# Patient Record
Sex: Male | Born: 1950 | Race: Black or African American | Hispanic: No | Smoking: Never smoker
Health system: Southern US, Community
[De-identification: ages and names within clinical notes are randomized; demographics above are authoritative.]

## PROBLEM LIST (undated history)

## (undated) DIAGNOSIS — E7800 Pure hypercholesterolemia, unspecified: Secondary | ICD-10-CM

## (undated) DIAGNOSIS — I2510 Atherosclerotic heart disease of native coronary artery without angina pectoris: Secondary | ICD-10-CM

## (undated) DIAGNOSIS — E119 Type 2 diabetes mellitus without complications: Secondary | ICD-10-CM

## (undated) DIAGNOSIS — N289 Disorder of kidney and ureter, unspecified: Secondary | ICD-10-CM

## (undated) DIAGNOSIS — I10 Essential (primary) hypertension: Secondary | ICD-10-CM

## (undated) HISTORY — DX: Type 2 diabetes mellitus without complications: E11.9

## (undated) HISTORY — DX: Disorder of kidney and ureter, unspecified: N28.9

## (undated) HISTORY — DX: Pure hypercholesterolemia, unspecified: E78.00

## (undated) HISTORY — PX: HERNIA REPAIR: SHX51

## (undated) HISTORY — DX: Atherosclerotic heart disease of native coronary artery without angina pectoris: I25.10

---

## 2006-03-22 ENCOUNTER — Emergency Department (HOSPITAL_COMMUNITY): Admission: EM | Admit: 2006-03-22 | Discharge: 2006-03-22 | Payer: Self-pay | Admitting: Emergency Medicine

## 2008-12-26 HISTORY — PX: NM MYOVIEW LTD: HXRAD82

## 2009-01-02 ENCOUNTER — Emergency Department (HOSPITAL_COMMUNITY): Admission: EM | Admit: 2009-01-02 | Discharge: 2009-01-02 | Payer: Self-pay | Admitting: Family Medicine

## 2009-01-02 ENCOUNTER — Observation Stay (HOSPITAL_COMMUNITY): Admission: EM | Admit: 2009-01-02 | Discharge: 2009-01-03 | Payer: Self-pay | Admitting: Emergency Medicine

## 2009-01-03 ENCOUNTER — Encounter (INDEPENDENT_AMBULATORY_CARE_PROVIDER_SITE_OTHER): Payer: Self-pay | Admitting: Family Medicine

## 2009-01-05 ENCOUNTER — Ambulatory Visit: Payer: Self-pay | Admitting: Family Medicine

## 2009-01-05 DIAGNOSIS — R7309 Other abnormal glucose: Secondary | ICD-10-CM | POA: Insufficient documentation

## 2009-01-05 DIAGNOSIS — R351 Nocturia: Secondary | ICD-10-CM | POA: Insufficient documentation

## 2009-01-05 DIAGNOSIS — R079 Chest pain, unspecified: Secondary | ICD-10-CM | POA: Insufficient documentation

## 2009-01-05 DIAGNOSIS — I10 Essential (primary) hypertension: Secondary | ICD-10-CM | POA: Insufficient documentation

## 2009-01-05 LAB — CONVERTED CEMR LAB: PSA: 1.94 ng/mL (ref 0.10–4.00)

## 2009-01-16 ENCOUNTER — Encounter (INDEPENDENT_AMBULATORY_CARE_PROVIDER_SITE_OTHER): Payer: Self-pay | Admitting: Family Medicine

## 2009-01-23 ENCOUNTER — Ambulatory Visit: Payer: Self-pay | Admitting: Family Medicine

## 2009-01-23 DIAGNOSIS — I10 Essential (primary) hypertension: Secondary | ICD-10-CM | POA: Insufficient documentation

## 2009-01-25 HISTORY — PX: CARDIAC CATHETERIZATION: SHX172

## 2009-02-03 ENCOUNTER — Ambulatory Visit (HOSPITAL_COMMUNITY): Admission: RE | Admit: 2009-02-03 | Discharge: 2009-02-03 | Payer: Self-pay | Admitting: Cardiology

## 2009-04-04 ENCOUNTER — Ambulatory Visit: Payer: Self-pay | Admitting: Physician Assistant

## 2009-04-04 DIAGNOSIS — N400 Enlarged prostate without lower urinary tract symptoms: Secondary | ICD-10-CM | POA: Insufficient documentation

## 2009-04-04 DIAGNOSIS — S335XXA Sprain of ligaments of lumbar spine, initial encounter: Secondary | ICD-10-CM | POA: Insufficient documentation

## 2009-04-04 DIAGNOSIS — E785 Hyperlipidemia, unspecified: Secondary | ICD-10-CM

## 2009-04-04 DIAGNOSIS — E782 Mixed hyperlipidemia: Secondary | ICD-10-CM | POA: Insufficient documentation

## 2009-04-18 ENCOUNTER — Ambulatory Visit: Payer: Self-pay | Admitting: Physician Assistant

## 2009-07-03 LAB — CONVERTED CEMR LAB
ALT: 16 units/L (ref 0–53)
AST: 18 units/L (ref 0–37)
Albumin: 4.1 g/dL (ref 3.5–5.2)
Alkaline Phosphatase: 49 units/L (ref 39–117)
BUN: 15 mg/dL (ref 6–23)
CO2: 29 meq/L (ref 19–32)
Calcium: 9 mg/dL (ref 8.4–10.5)
Chloride: 100 meq/L (ref 96–112)
Creatinine, Ser: 1.07 mg/dL (ref 0.40–1.50)
Glucose, Bld: 83 mg/dL (ref 70–99)
Potassium: 4.5 meq/L (ref 3.5–5.3)
Sodium: 137 meq/L (ref 135–145)
Total Bilirubin: 0.5 mg/dL (ref 0.3–1.2)
Total Protein: 7 g/dL (ref 6.0–8.3)

## 2009-11-28 ENCOUNTER — Ambulatory Visit: Payer: Self-pay | Admitting: Family Medicine

## 2009-11-28 ENCOUNTER — Ambulatory Visit (HOSPITAL_COMMUNITY): Admission: RE | Admit: 2009-11-28 | Discharge: 2009-11-28 | Payer: Self-pay | Admitting: Family Medicine

## 2009-11-28 DIAGNOSIS — R0609 Other forms of dyspnea: Secondary | ICD-10-CM

## 2009-11-28 DIAGNOSIS — R0600 Dyspnea, unspecified: Secondary | ICD-10-CM | POA: Insufficient documentation

## 2009-11-28 LAB — CONVERTED CEMR LAB
ALT: 16 units/L (ref 0–53)
AST: 15 units/L (ref 0–37)
Albumin: 4.3 g/dL (ref 3.5–5.2)
Alkaline Phosphatase: 50 units/L (ref 39–117)
BUN: 12 mg/dL (ref 6–23)
CO2: 27 meq/L (ref 19–32)
Calcium: 9.7 mg/dL (ref 8.4–10.5)
Chloride: 101 meq/L (ref 96–112)
Cholesterol: 185 mg/dL (ref 0–200)
Creatinine, Ser: 1.19 mg/dL (ref 0.40–1.50)
Glucose, Bld: 104 mg/dL — ABNORMAL HIGH (ref 70–99)
H Pylori IgG: POSITIVE
HCT: 42.1 % (ref 39.0–52.0)
HDL: 67 mg/dL (ref >39)
Hemoglobin: 14.2 g/dL (ref 13.0–17.0)
LDL Cholesterol: 106 mg/dL — ABNORMAL HIGH (ref 0–99)
MCHC: 33.7 g/dL (ref 30.0–36.0)
MCV: 89.2 fL (ref 78.0–100.0)
PSA: 1.9 ng/mL (ref 0.10–4.00)
Platelets: 374 10*3/uL (ref 150–400)
Potassium: 4.4 meq/L (ref 3.5–5.3)
RBC: 4.72 M/uL (ref 4.22–5.81)
RDW: 14.2 % (ref 11.5–15.5)
Sodium: 136 meq/L (ref 135–145)
Total Bilirubin: 0.5 mg/dL (ref 0.3–1.2)
Total CHOL/HDL Ratio: 2.8
Total Protein: 7.3 g/dL (ref 6.0–8.3)
Triglycerides: 62 mg/dL (ref <150)
VLDL: 12 mg/dL (ref 0–40)
WBC: 4.8 10*3/uL (ref 4.0–10.5)

## 2009-11-29 ENCOUNTER — Encounter: Payer: Self-pay | Admitting: Family Medicine

## 2009-11-29 ENCOUNTER — Telehealth: Payer: Self-pay | Admitting: Family Medicine

## 2009-11-30 ENCOUNTER — Telehealth: Payer: Self-pay | Admitting: Family Medicine

## 2009-12-07 ENCOUNTER — Encounter: Payer: Self-pay | Admitting: Family Medicine

## 2009-12-12 ENCOUNTER — Telehealth: Payer: Self-pay | Admitting: Family Medicine

## 2010-03-27 NOTE — Progress Notes (Signed)
  Phone Note Outgoing Call   Call placed by: Paula Compton MD,  November 29, 2009 2:12 PM Call placed to: Patient Summary of Call: Called patient and relayed results of labs, CXR.  Of note, mildly elevated fasting glucose (106).  Will recheck BP and fasting glucose in the coming 2 months.  reminder letter sent to patient. Initial call taken by: Paula Compton MD,  November 29, 2009 2:13 PM

## 2010-03-27 NOTE — Progress Notes (Signed)
  Phone Note Call from Patient   Caller: Patient Call For: [phone] Summary of Call: Patient want to discuss the meds you prescribed when he came for his first visit.  Thought it was only going to be one.  There were four waiting and he didn't understand what they were for.    Called patient back to discuss meds.  Left voice mail.  I had refilled the meds he had been placed on at [facility].  Please make appt for followup, and we will discuss. Paula Compton MD  December 12, 2009 12:35 PM

## 2010-03-27 NOTE — Letter (Signed)
Summary: REQUESTING RECORDS FROM [facility]   REQUESTING RECORDS FROM DR.JORDAN   Imported By: Arta Bruce 03/09/2009 16:00:08  _____________________________________________________________________  External Attachment:    Type:   Image     Comment:   External Document

## 2010-03-27 NOTE — Assessment & Plan Note (Signed)
Summary: 2 WEEK FU/////KT   Vital Signs:  Patient profile:   60 year old male Weight:      173.8 pounds BMI:     24.33 Temp:     98.2 degrees F oral Pulse rate:   71 / minute Pulse rhythm:   regular Resp:     17 per minute BP sitting:   160 / 114  (left arm) Cuff size:   regular  Vitals Entered By: Geanie Cooley (January 23, 2009 2:36 PM) CC: pt states that he has a little tightness in his check every now and then, pt wants to know results from blood work from previous visit Is Patient Diabetic? No Pain Assessment Patient in pain? no       Does patient need assistance? Functional Status Self care Ambulation Normal   Primary Provider:  Carolyne Fiscal  CC:  pt states that he has a little tightness in his check every now and then and pt wants to know results from blood work from previous visit.  History of Present Illness: BP again elevated today. Pt states was elevated during recent hospitalization and mildly elevated at cardiologist's office. He states he has never taken any medication for this.   Chest pain is unchanged, stable and controlled with rare use of NTG as well as daily ASA. He is scheduled to see cardiology over next week to set up a time to get angio and stent.  A1c in hospital was 6.1  Prostate- pt states that he gets up to urinate 2x per night, does have some dribbling and low flow stream. denies hematuria, recent weight loss. Unchanged from previous visit. He wishes to do something about it. Is desiring medications.  Allergies (verified): No Known Drug Allergies  Past History:  Past Medical History: Hypertension BPH  Review of Systems General:  Denies chills, fever, malaise, and sweats. CV:  Complains of chest pain or discomfort; denies difficulty breathing at night, fainting, leg cramps with exertion, near fainting, palpitations, shortness of breath with exertion, swelling of feet, and swelling of hands. Resp:  Complains of chest discomfort; denies chest  pain with inspiration, cough, coughing up blood, and wheezing. GI:  Denies abdominal pain.  Physical Exam  General:  Well-developed,well-nourished,in no acute distress; alert,appropriate and cooperative throughout examination Head:  Normocephalic and atraumatic without obvious abnormalities. No apparent alopecia or balding. Eyes:  vision grossly intact.   Ears:  no external deformities.   Nose:  no external deformity.   Neck:  No deformities, masses, or tenderness noted. Lungs:  Normal respiratory effort, chest expands symmetrically. Lungs are clear to auscultation, no crackles or wheezes. Heart:  Normal rate and regular rhythm. S1 and S2 normal without gallop, murmur, click, rub or other extra sounds. Abdomen:  Bowel sounds positive,abdomen soft and non-tender without masses, organomegaly or hernias noted. Psych:  Cognition and judgment appear intact. Alert and cooperative with normal attention span and concentration. No apparent delusions, illusions, hallucinations   Impression & Recommendations:  Problem # 1:  HYPERTENSION NEC (ICD-997.91) Elevated during last two visits.  Given DASH diet information.  Begin HCTZ today. Will see back in near future for BMP and re-eval BP. May require further BP lowering therapy.  Problem # 2:  OTHER ABNORMAL GLUCOSE (ICD-790.29) 12/2008 A1C 6.1 Impaired Glucose. DASH diet given.  Problem # 3:  NOCTURIA (ZOX-096.04) Assessment: Unchanged PSA 1.4. Rx for terazosin given to begin after he has stent placed.  Problem # 4:  CHEST PAIN (ICD-786.50) Assessment: Unchanged Failed stress test.  Seeing Cardiology for stent in near future. Has NTG and ASA.  Problem # 5:  PREVENTIVE HEALTH CARE (ICD-V70.0) 12/2008: A1C 6.1 PSA 1.4 Flu vaccine 12/2008.  Complete Medication List: 1)  Aspirin 81 Mg Tbec (Aspirin) .[name].. 1 by mouth daily 2)  Nitrostat 0.3 Mg Subl (Nitroglycerin) .... Take as needed chest pain 3)  Terazosin Hcl 1 Mg Caps (Terazosin hcl)  .[name].. 1 by mouth qhs 4)  Hydrochlorothiazide 25 Mg Tabs (Hydrochlorothiazide) .[name].. 1 by mouth daily  Patient Instructions: 1)  Please schedule a follow-up appointment for one week after stent placement. 2)  DASH diet given. 3)  Take an Aspirin every day. 4)  Check your Blood Pressure regularly. If it is above:140/90 you should make an appointment. Prescriptions: HYDROCHLOROTHIAZIDE 25 MG TABS (HYDROCHLOROTHIAZIDE) 1 by mouth daily  #30 x 11   Entered and Authorized by:   Syliva Overman MD   Signed by:   Syliva Overman MD on 01/23/2009   Method used:   Print then Give to Patient   RxID:   [id number] TERAZOSIN HCL 1 MG CAPS (TERAZOSIN HCL) 1 by mouth qhs  #30 x 11   Entered and Authorized by:   Syliva Overman MD   Signed by:   Syliva Overman MD on 01/23/2009   Method used:   Print then Give to Patient   RxID:   [id number]    Vital Signs:  Patient profile:   60 year old male Weight:      173.8 pounds BMI:     24.33 Temp:     98.2 degrees F oral Pulse rate:   71 / minute Pulse rhythm:   regular Resp:     17 per minute BP sitting:   160 / 114  (left arm) Cuff size:   regular  Vitals Entered By: Geanie Cooley (January 23, 2009 2:36 PM)  Appended Document: 2 WEEK FU/////KT   Appended Document: level of service added    Clinical Lists Changes  Orders: Added new Service order of Est. Patient Level IV (29528) - Signed Added new Service order of Est. Patient age 105-64 [phone]) - Signed

## 2010-03-27 NOTE — Letter (Signed)
Summary: PT INFORMATION SHEET  PT INFORMATION SHEET   Imported By: Arta Bruce 02/20/2009 12:18:15  _____________________________________________________________________  External Attachment:    Type:   Image     Comment:   External Document

## 2010-03-27 NOTE — Assessment & Plan Note (Signed)
Summary: np,df  Declines flu today ............................................... [facility] November 28, 2009 9:28 AM  Vital Signs:  Patient profile:   60 year old male Height:      71 inches Weight:      182.38 pounds BMI:     25.53 BSA:     2.03 Temp:     99.0 degrees F Pulse rate:   66 / minute BP sitting:   148 / 90  Vitals Entered By: Adam Burton CMA (November 28, 2009 8:38 AM) CC: NP Is Patient Diabetic? No Pain Assessment Patient in pain? yes     Location: both sides Intensity: 5   Primary Care Yussuf Sawyers:  Adam Burton  CC:  NP.  History of Present Illness: Adam Burton is new to our office, formerly a patient of [facility].  HIstory of HTN, high cholesterol and CAD found on cardiac catheterization without PCI (Dr. Swaziland is his cardiologist), here to establish care and "get back on track for my health".  His only complaint at this time is some nagging upper abdominal discomfort which is worse after meals.  Not associated with chest pain, nausea/vomiting, changes in bowel habits.  Has had some dyspnea with walking while talkingon cell phone, not as much if walking without talking.  Again, no associated chest pain.   Reviewed meds and ROS, recorded in appropriate sections of the chart.   Current Medications (verified): 1)  Aspirin 81 Mg Tbec (Aspirin) .[name].. 1 By Mouth Daily 2)  Nitrostat 0.3 Mg Subl (Nitroglycerin) .... Take As Needed Chest Pain 3)  Terazosin Hcl 1 Mg Caps (Terazosin Hcl) .[name].. 1 By Mouth Qhs 4)  Hydrochlorothiazide 25 Mg Tabs (Hydrochlorothiazide) .[name].. 1 By Mouth Daily 5)  Pravachol 20 Mg Tabs (Pravastatin Sodium) .... Take 1 Tab By Mouth At Bedtime For Cholesterol 6)  Proscar 5 Mg Tabs (Finasteride) .... Take 1 Tablet By Mouth Once A Day  Allergies (verified): No Known Drug Allergies  Past History:  Past Surgical History: Release of erection  Hernia repair when 6-7 yrs of age  Nov 28, 2009: Reports appendectomy at age 50 yrs.  Family  History: mother with DM and HTN, heart disease.  No known colon or prostate cancer in family.   Social History: Occupation: working in store Alcohol use-no Drug use-yes, [facility] Regular exercise-no  Nov 28, 2009: Never cigarette smoker, does smoke one THC/day.  Reports 2 shots liquor a day. 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  Born [place], came to [place] as a boy and raised there.  Here in [place] for past 10 yrs.  Lives alone.   Review of Systems       Denies chest pain, blood in stool, changes in stool habits. Reports weak urinary stream when off prostate meds, otherwise normal stream with no nocturia noted.  Reports dyspnea with walking and talking on phone for past 6 months, without chest pain.  Reports abd pain in upper abd that is worse after meals, when he lays down.  Denies weight loss or gain recently.   Physical Exam  General:  well appearing, no apparent distress Eyes:  clear sclerae bilat.  Mouth:  moist mucus membranes Neck:  neck supple.  Lungs:  Normal respiratory effort, chest expands symmetrically. Lungs are clear to auscultation, no crackles or wheezes. Heart:  Normal rate and regular rhythm. S1 and S2 normal without gallop, murmur, click, rub or other extra sounds. Abdomen:  soft, with mild epigastric tenderness. No masses or megaly noted.  Audible bowel sounds, mild  distention.  Extremities:  palpable dp pulses bilat.  No visible foot lesions, sensation grossly intact bilat.  No edema noted in ankles/lower extremities Neurologic:  sensation intact to light touch and gait normal.     Impression & Recommendations:  Problem # 1:  ABDOMINAL PAIN, EPIGASTRIC (ICD-789.06) Has never had H pylori checked.  Consider PPI to see if this improves.  Smoking and EtOH will aggravate this problem.   Orders: H pylori-[facility] ([address])  Problem # 2:  SHORTNESS OF BREATH (ICD-786.05) CXR to evaluate for pulmonary etiology.  To review past cath records (in [place]) and consider possible cardiac etiology  to this Orders: CXR- 2view (CXR)  Problem # 3:  HYPERTENSION (ICD-401.9) To continue his HCTZ; refills issued.  Consider for ACEI to improve his BP control.  Has not taken meds today His updated medication list for this problem includes:    Terazosin Hcl 1 Mg Caps (Terazosin hcl) .[name]... 1 by mouth qhs    Hydrochlorothiazide 25 Mg Tabs (Hydrochlorothiazide) .[name]... 1 by mouth daily  Orders: Comp Met-[facility] ([id number]) CBC-[facility] ([address]78)  Problem # 4:  DYSLIPIDEMIA (ICD-272.4) Recheck of lipid panel today. Continue Statin medication until results of lipid panel are back to determine if change in management needed His updated medication list for this problem includes:    Pravachol 20 Mg Tabs (Pravastatin sodium) .[name]... Take 1 tab by mouth at bedtime for cholesterol  Orders: Lipid-[facility] ([id number]) Comp Met-[facility] ([id number])  Problem # 5:  SCREENING, COLON CANCER (ICD-V76.51) Has never had colonoscopy for screening; discarded stool cards given to him in the past by [facility] providers.  He pledges to complete the stool card collection for colon cancer screening here today.  Will follow up on this at future visits.  Orders: Miscellaneous Lab Charge-[facility] [id number])  Complete Medication List: 1)  Aspirin 81 Mg Tbec (Aspirin) .[name].. 1 by mouth daily 2)  Nitrostat 0.3 Mg Subl (Nitroglycerin) .... Take as needed chest pain 3)  Terazosin Hcl 1 Mg Caps (Terazosin hcl) .[name].. 1 by mouth qhs 4)  Hydrochlorothiazide 25 Mg Tabs (Hydrochlorothiazide) .[name].. 1 by mouth daily 5)  Pravachol 20 Mg Tabs (Pravastatin sodium) .... Take 1 tab by mouth at bedtime for cholesterol 6)  Proscar 5 Mg Tabs (Finasteride) .... Take 1 tablet by mouth once a day  Other Orders: PSA-[facility] ([id number])  Patient Instructions: 1)  It was a pleasure to see you today.  2)  I will call you at [phone] with the results of todaY'S fasting bloodwork. 3)  I am sending you for a chest xray to look for causes of the shortness of breath.    4)  I would like to see you back in another 4 to 6 weeks, or sooner if needed.  Prescriptions: PROSCAR 5 MG TABS (FINASTERIDE) Take 1 tablet by mouth once a day  #90 x 3   Entered and Authorized by:   Adam Compton MD   Signed by:   Adam Compton MD on 11/28/2009   Method used:   Electronically to        [facility]       [address]       [place], [place]  [address]       Ph: [phone]       Fax: [phone]   RxID:   [id number] PRAVACHOL 20 MG TABS (PRAVASTATIN SODIUM) Take 1 tab by mouth at bedtime for cholesterol  #90 x 6  Entered and Authorized by:   Adam Compton MD   Signed by:   Adam Compton MD on 11/28/2009   Method used:   Electronically to        [facility]       [address]       [place], [place]  [address]       Ph: [phone]       Fax: [phone]   RxID:   [id number] HYDROCHLOROTHIAZIDE 25 MG TABS (HYDROCHLOROTHIAZIDE) 1 by mouth daily  #90 x 6   Entered and Authorized by:   Adam Compton MD   Signed by:   Adam Compton MD on 11/28/2009   Method used:   Electronically to        [facility] ([facility])       [address]       [place], [place]  [address]       Ph: [phone]       Fax: [phone]   RxID:   [id number] TERAZOSIN HCL 1 MG CAPS (TERAZOSIN HCL) 1 by mouth qhs  #90 x 6   Entered and Authorized by:   Adam Compton MD   Signed by:   Adam Compton MD on 11/28/2009   Method used:   Electronically to        [facility] (retail)       [address]       [place], [place]  [address]       Ph: [phone]       Fax: [phone]   RxID:   [id number]   Laboratory Results   Blood Tests   Date/Time Received: November 28, 2009 9:20 AM  Date/Time Reported: November 28, 2009 10:44 AM    H. pylori: positive Comments: ...............test performed by......Marland KitchenBonnie A.  Burton, MLS (ASCP)cm     In light of positive H pylori, will recommend treatment with amox 1g twice daily, clarithromycin 500mg  twice daily, and omeprazole 20mg  twice daily for 14 days.   Adam Compton MD  November 28, 2009 4:58 PM  Appended Document: Hemoccult card results  Laboratory Results  Date/Time Received: December 04, 2009   Date/Time Reported: December 06, 2009 1:44 PM  Comments: Stool samples were placed on wrong side of cards. Unable to test.  Pt.will need to be given new cards with complete instructions on collection. Adam Burton  December 06, 2009 1:44 PM    Will write letter to patient asking him to return for more cards. Adam Compton MD  December 07, 2009 2:04 PM

## 2010-03-27 NOTE — Letter (Signed)
Summary: Generic Letter  [facility]  [address]   [place], [place] [address]   Phone: [phone]  Fax: [phone]    11/29/2009  TURNER BAILLIE [address] [place], [place]  [address]  Dear Mr. TSOU,   This letter is a follow-up to our phone conversation from earlier today. Your fasting glucose (sugar) was very mildly above the normal level.  This does not qualify for the diagnosis of diabetes, but it is important to identify this if it persists.   Please make an appointment to have fasting labs drawn (nothing to eat or drink other than water for 8 hours) in the coming 2 months, and then to see me afterward.  I look forward to seeing you after your fasting labs.         Sincerely,   Paula Compton MD  Appended Document: Generic Letter mailed.

## 2010-03-27 NOTE — Letter (Signed)
Summary: DOCUMENTATION SHEET/DR.MICHAEL CONNOR  DOCUMENTATION SHEET/DR.MICHAEL CONNOR   Imported By: Arta Bruce 02/20/2009 12:14:19  _____________________________________________________________________  External Attachment:    Type:   Image     Comment:   External Document

## 2010-03-27 NOTE — Letter (Signed)
Summary: NUTRITIONIST SUMMARY  NUTRITIONIST SUMMARY   Imported By: Arta Bruce 06/20/2009 15:16:10  _____________________________________________________________________  External Attachment:    Type:   Image     Comment:   External Document

## 2010-03-27 NOTE — Progress Notes (Signed)
  Phone Note Call from Patient   Caller: Patient Call For: [phone] Summary of Call: Patient requesting a call back from physician. Initial call taken by: Abundio Miu,  November 30, 2009 9:07 AM    I called patient back; he has questions about how to return stool cards for fecal occult blood (colon cancer screening).  Clarified with him, as well as the plan for followup fasting labs and BP check before next physician appt.  Paula Compton MD  November 30, 2009 9:41 AM

## 2010-03-27 NOTE — Letter (Signed)
Summary: Discharge Summary  Discharge Summary   Imported By: Arta Bruce 01/11/2009 10:57:19  _____________________________________________________________________  External Attachment:    Type:   Image     Comment:   External Document

## 2010-03-27 NOTE — Progress Notes (Signed)
Summary: Office Visit/DEPRESSION SCREENING  Office Visit/DEPRESSION SCREENING   Imported By: Arta Bruce 02/15/2009 13:00:27  _____________________________________________________________________  External Attachment:    Type:   Image     Comment:   External Document

## 2010-03-27 NOTE — Letter (Signed)
Summary: REST OF DOCUMENTATIONSHEET FROM DR.CONNOR  REST OF DOCUMENTATIONSHEET FROM DR.CONNOR   Imported By: Arta Bruce 02/20/2009 12:15:54  _____________________________________________________________________  External Attachment:    Type:   Image     Comment:   External Document

## 2010-03-27 NOTE — Letter (Signed)
Summary: REFERRAL DIABETIC CLINIC //APPT DATE & TIME  REFERRAL DIABETIC CLINIC //APPT DATE & TIME   Imported By: Arta Bruce 05/26/2009 16:00:43  _____________________________________________________________________  External Attachment:    Type:   Image     Comment:   External Document

## 2010-03-27 NOTE — Assessment & Plan Note (Signed)
Summary: HEART ISSUES /PROSTATE//SS/   Vital Signs:  Patient profile:   59 year old male Height:      71 inches Weight:      173 pounds BMI:     24.22 Temp:     97.6 degrees F oral Pulse rate:   75 / minute Pulse rhythm:   regular Resp:     18 per minute BP sitting:   128 / 88  (left arm) Cuff size:   regular  Vitals Entered By: Armenia Shannon (April 04, 2009 3:33 PM) CC: f/u.. pt says he has a rigtht  back pain... pt thinks its his kidneys Is Patient Diabetic? No Pain Assessment Patient in pain? no       Does patient need assistance? Functional Status Self care Ambulation Normal   Primary Care Provider:  Carolyne Fiscal  CC:  f/u.. pt says he has a rigtht  back pain... pt thinks its his kidneys.  History of Present Illness: 60 year old male here for followup.  He is to see Dr. Carolyne Fiscal.  He was placed on HCTZ for his blood pressure.  He was also placed on Hytrin for his blood pressure and benign prostatic hypertrophy.  The patient has a lot of significant symptoms of nocturia.  The Hytrin has not really helped all that much.  He still gets up several times a night to urinate.  He has some dribbling sensation at night as well.  He denies dysuria.  He denies hematuria.  He denies any significant daytime symptoms.  His prostate was enlarged on exam initially saw Dr. Carolyne Fiscal.  His PSA was 1.4.  The patient underwent cardiac catheterization with Dr. Swaziland.  This demonstrated normal LV function and normal coronary anatomy.  He did have minor luminal irregularities.  In looking through his records his LDL was 106 when he was hospitalized in November.  He is still taking aspirin.  He complains of right-sided back pain.  This is been present for the last 2 days.  He denies any injury.  He denies any radiation down his leg.  He denies any radiation to his groin.  He tried anything for it.  He denies any dysuria or hematuria.  It is worse with certain types of movements.  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 Occupation: working in store Alcohol use-no Drug use-yes, THC Regular exercise-no  Physical Exam  General:  alert, well-developed, and well-nourished.   Head:  normocephalic and atraumatic.   Neck:  supple.   Lungs:  normal breath sounds, no crackles, and no wheezes.   Heart:  normal rate and regular rhythm.   Abdomen:  soft and non-tender.   Msk:  no CVA tenderness No spinal tenderness to palpation Negative straight leg raise bilaterally Paraspinal muscles on the right lower lumbar area somewhat tight Neurologic:  alert & oriented X3, cranial nerves II-XII intact, strength normal in all extremities, and DTRs symmetrical and normal.   Psych:  normally interactive.  Impression & Recommendations:  Problem # 1:  OTHER ABNORMAL GLUCOSE (ICD-790.29)  refer to Susie Piper  Orders: Diabetic Clinic Referral (Diabetic)  Problem # 2:  HYPERTENSION (ICD-401.9) check cmet better controlled   His updated medication list for this problem includes:    Terazosin Hcl 1 Mg Caps (Terazosin hcl) .[name]... 1 by mouth qhs    Hydrochlorothiazide 25 Mg Tabs (Hydrochlorothiazide) .[name]... 1 by mouth daily  Problem # 3:  DYSLIPIDEMIA (ICD-272.4)  with HTN, glucose intolerance and luminal irregs on cath, rec he take a statin continue asa   Orders: T-Comprehensive Metabolic Panel ([address]-98119)  His updated medication list for this  problem includes:    Pravachol 20 Mg Tabs (Pravastatin sodium) .[name]... Take 1 tab by mouth at bedtime for cholesterol  Problem # 4:  BEN LOC HYPERPLASIA PROS W/O UR OBST & OTH LUTS (ICD-600.20) with continued nocturia and enlarged prostate on exam previously and minimal to no response to hytrin, will add proscar  Problem # 5:  LUMBAR STRAIN (ICD-847.2) rest Heat  NSAID Followup p.r.n.  Problem # 6:  PREVENTIVE HEALTH CARE (ICD-V70.0) the patient still needs to complete stool cards Update tetanus today Will discuss referral to Dr. Corinda Gubler for screening colonoscopy at followup  Complete Medication List: 1)  Aspirin 81 Mg Tbec (Aspirin) .[name].. 1 by mouth daily 2)  Nitrostat 0.3 Mg Subl (Nitroglycerin) .... Take as needed chest pain 3)  Terazosin Hcl 1 Mg Caps (Terazosin hcl) .[name].. 1 by mouth qhs 4)  Hydrochlorothiazide 25 Mg Tabs (Hydrochlorothiazide) .[name].. 1 by mouth daily 5)  Pravachol 20 Mg Tabs (Pravastatin sodium) .... Take 1 tab by mouth at bedtime for cholesterol 6)  Proscar 5 Mg Tabs (Finasteride) .... Take 1 tablet by mouth once a day  Patient Instructions: 1)  Schedule fasting labs in 3 mos (Lipids and CMET) for Dx 272.4. 2)  Tetanus shot today. 3)  Please schedule a follow-up appointment in 3 months with Taleen Prosser for blood pressure and prostate.  4)  Take 400-600 mg of Ibuprofen (Advil, Motrin) with food every 4-6 hours as needed  for relief of pain or comfort of fever.  5)  Apply heat to your back three times a day for pain relief. 6)  Your back pain should improve in the next 1-2 weeks.  Return sooner if there is no improvement or it gets worse. Prescriptions: PROSCAR 5 MG TABS (FINASTERIDE) Take 1 tablet by mouth once a day  #30 x 3   Entered and Authorized by:   Tereso Newcomer PA-C   Signed by:   Tereso Newcomer PA-C on 04/04/2009   Method used:   Print then Give to Patient   RxID:   [id number] PRAVACHOL 20 MG TABS (PRAVASTATIN SODIUM) Take 1 tab by mouth at bedtime for  cholesterol  #30 x 5   Entered and Authorized by:   Tereso Newcomer PA-C   Signed by:   Tereso Newcomer PA-C on 04/04/2009   Method used:   Print then Give to Patient   RxID:   [id number]    Cardiac Cath  Procedure date:  02/03/2009  Findings:      HEMODYNAMIC DATA:  Aortic pressure is 120/72 with mean of 93 mmHg, left   ventricle pressure is 126 with EDP of 7 mmHg.      ANGIOGRAPHIC DATA:  The left coronary artery arises and distributes   normally.  The left main coronary artery is normal.      The left anterior descending artery has diffuse  minor irregularities in   the mid vessel less than 10%.      The left circumflex coronary artery is normal.      The right coronary artery is normal.      The left ventricular angiography was performed in RAO view.  It   demonstrates normal left ventricular size and contractility with normal   systolic function.  There is no mitral valve prolapse or regurgitation.      FINAL INTERPRETATION:   1. Normal coronary anatomy.   2. Normal left ventricular function.                  ______________________________   Peter M. Swaziland, M.D.

## 2010-03-27 NOTE — Letter (Signed)
Summary: Generic Letter  [facility]  [address]   [place], [place] [address]   Phone: [phone]  Fax: [phone]    12/07/2009  Adam Burton [address] [place], [place]  [address]  Dear Mr. [name],   I hope this letter finds you well.  I write to thank you for returning the stool cards to our office.   Unfortunately, the stool samples were placed on the wrong side of the cards, and the lab was unable to run the test. Please stop by the clinic to receive new cards, and to get instructions on how to collect the sample.           Sincerely,   Paula Compton MD  Appended Document: Generic Letter mailed.

## 2010-03-27 NOTE — Assessment & Plan Note (Signed)
Summary: XFU--CHEST PAIN//YC   Vital Signs:  Patient profile:   60 year old male Height:      71 inches Weight:      167 pounds BMI:     23.38 Temp:     97.4 degrees F oral Pulse rate:   66 / minute Pulse rhythm:   regular Resp:     18 per minute BP sitting:   141 / 84  (left arm) Cuff size:   regular  Vitals Entered By: Armenia Shannon (January 05, 2009 3:18 PM) CC: xf/u...... NP......[name] pt is concerned about the blockage that Dr. Swaziland let him know about today after taking the stress test.... pt has f/u with Dr. Swaziland Is Patient Diabetic? No Pain Assessment Patient in pain? no       Does patient need assistance? Functional Status Self care Ambulation Normal   Primary Provider:  Carolyne Fiscal  CC:  xf/u...... NP......[name] pt is concerned about the blockage that Dr. Swaziland let him know about today after taking the stress test.... pt has f/u with Dr. Swaziland.  History of Present Illness: Chest pain 3 days ago. Pt went to urgent care where he was given NTG and sent to ED. Pt was kept overnight with neg CE. EKG with nonspecific t wave abn. Pt discharged to have stress test next day. Pt states that cards called him this am and told him that he had some blockage and he has appt to see them again 01/27/2009. He has been without pain since incident. He has NTG, and will pick up ASA 81mg  to begin taking. His pain was assoc with rest, rad to jaw and L axilla, diaphoresis, difficulty breathing and resolution with NTG.  BP mildly elevated today. Pt states was elevated during hospitalization and mildly elevated at cardiologist's office. He states he has never taken any medication for this.   A1c in hospital was 6.1  Prostate- pt states that he gets up to urinate 2x per night, does have some dribbling and low flow stream. denies hematuria, recent weight loss.  Current Medications (verified): 1)  None  Allergies (verified): No Known Drug Allergies  Comments:  Nurse/Medical Assistant: pt does  have nitroglycerin med but he is not taking any med.......  Past History:  Past Surgical History: Release of erection  Hernia repair when 6-7 yrs of age  Social History: Occupation: working in store Alcohol use-no Drug use-yes, THC Regular exercise-no Occupation:  employed Drug Use:  yes Does Patient Exercise:  no  Review of Systems       The patient complains of chest pain and dyspnea on exertion.  The patient denies anorexia, fever, weight loss, weight gain, vision loss, decreased hearing, hoarseness, syncope, peripheral edema, prolonged cough, headaches, hemoptysis, abdominal pain, melena, hematochezia, severe indigestion/heartburn, hematuria, incontinence, genital sores, muscle weakness, suspicious skin lesions, transient blindness, difficulty walking, depression, unusual weight change, abnormal bleeding, enlarged lymph nodes, angioedema, breast masses, and testicular masses.    Physical Exam  General:  Well-developed,well-nourished,in no acute distress; alert,appropriate and cooperative throughout examination Head:  Normocephalic and atraumatic without obvious abnormalities. No apparent alopecia or balding. Eyes:  No corneal or conjunctival inflammation noted. EOMI. Perrla. Funduscopic exam benign, without hemorrhages, exudates or papilledema. Vision grossly normal. Ears:  External ear exam shows no significant lesions or deformities.  Otoscopic examination reveals clear canals, tympanic membranes are intact bilaterally without bulging, retraction, inflammation or discharge. Hearing is grossly normal bilaterally. Nose:  External nasal examination shows no deformity or inflammation. Nasal mucosa are  pink and moist without lesions or exudates. Mouth:  Oral mucosa and oropharynx without lesions or exudates.  Teeth in good repair. Neck:  No deformities, masses, or tenderness noted. Lungs:  Normal respiratory effort, chest expands symmetrically. Lungs are clear to auscultation, no  crackles or wheezes. Heart:  Normal rate and regular rhythm. S1 and S2 normal without gallop, murmur, click, rub or other extra sounds. Abdomen:  Bowel sounds positive,abdomen soft and non-tender without masses, organomegaly or hernias noted. Prostate:  >25gm enlarged and L asymmetrical enlargement.   Msk:  No deformity or scoliosis noted of thoracic or lumbar spine.   Neurologic:  No cranial nerve deficits noted. Station and gait are normal. Skin:  Intact without suspicious lesions or rashes Psych:  Cognition and judgment appear intact. Alert and cooperative with normal attention span and concentration. No apparent delusions, illusions, hallucinations   Impression & Recommendations:  Problem # 1:  CHEST PAIN (ICD-786.50) Not currently present, recent hospitalization. Pt seeing cards, per patient had abnormal stress test. Pt to f/u with cards. Will need medical risk reduction based on labs and BP checks. f/u here in 2 weeks and with cards on 01/27/2009.  Problem # 2:  ELEVATED BP READING WITHOUT DX HYPERTENSION (ICD-796.2) check outside and RTC in 2 weeks for BP check, consider starting meds.  Problem # 3:  NOCTURIA (ZOX-096.04) Prostate enlarged on exam.   Orders: T-PSA (54098-11914)  Problem # 4:  OTHER ABNORMAL GLUCOSE (ICD-790.29) A1C 6.1 in hospital.  Problem # 5:  Preventive Health Care (ICD-V70.0)  Orders: T-HIV Antibody  (Reflex) ([id number]) T-PSA ([id number]) Hemoccult Cards -3 specimans (take home) (95284) Influenza Vaccine MCR (13244) State-Tetanus Vaccine IM  (01027O)  Complete Medication List: 1)  Aspirin 81 Mg Tbec (Aspirin) .[name].. 1 by mouth daily 2)  Nitrostat 0.3 Mg Subl (Nitroglycerin) .... Take as needed chest pain  Other Orders: Flu Vaccine 75yrs + (53664) Admin 1st Vaccine (40347) Admin 1st Vaccine [facility]) [phone])  Patient Instructions: 1)  Please schedule a follow-up appointment in 2 weeks for BP check. 2)  Check your Blood Pressure regularly. 3)   NTG with chest pain.  4)  F/u with cardiology due to results of stress test.   Influenza Vaccine    Vaccine Type: Fluvax 3+    Site: right deltoid    Mfr: Sanofi Pasteur    Dose: 0.5 ml    Route: IM    Given by: Armenia Shannon    Exp. Date: 08/24/2009    Lot #: [id number]    VIS given: 09/18/06 version given January 05, 2009.  Flu Vaccine Consent Questions    Do you have a history of severe allergic reactions to this vaccine? no    Any prior history of allergic reactions to egg and/or gelatin? no    Do you have a sensitivity to the preservative Thimersol? no    Do you have a past history of Guillan-Barre Syndrome? no    Do you currently have an acute febrile illness? no    Have you ever had a severe reaction to latex? no    Vaccine information given and explained to patient? yes

## 2010-05-30 LAB — DIFFERENTIAL
Basophils Absolute: 0 10*3/uL (ref 0.0–0.1)
Basophils Relative: 1 % (ref 0–1)
Eosinophils Absolute: 0.1 10*3/uL (ref 0.0–0.7)
Eosinophils Relative: 2 % (ref 0–5)
Lymphocytes Relative: 46 % (ref 12–46)
Lymphs Abs: 2.5 10*3/uL (ref 0.7–4.0)
Monocytes Absolute: 0.7 10*3/uL (ref 0.1–1.0)
Monocytes Relative: 12 % (ref 3–12)
Neutro Abs: 2.2 10*3/uL (ref 1.7–7.7)
Neutrophils Relative %: 40 % — ABNORMAL LOW (ref 43–77)

## 2010-05-30 LAB — BASIC METABOLIC PANEL
BUN: 10 mg/dL (ref 6–23)
BUN: 12 mg/dL (ref 6–23)
CO2: 28 mEq/L (ref 19–32)
CO2: 29 mEq/L (ref 19–32)
Calcium: 8.5 mg/dL (ref 8.4–10.5)
Calcium: 8.8 mg/dL (ref 8.4–10.5)
Chloride: 102 mEq/L (ref 96–112)
Chloride: 105 mEq/L (ref 96–112)
Creatinine, Ser: 1.03 mg/dL (ref 0.4–1.5)
Creatinine, Ser: 1.06 mg/dL (ref 0.4–1.5)
GFR calc Af Amer: >60 mL/min (ref >60)
GFR calc Af Amer: >60 mL/min (ref >60)
GFR calc non Af Amer: >60 mL/min (ref >60)
GFR calc non Af Amer: >60 mL/min (ref >60)
Glucose, Bld: 82 mg/dL (ref 70–99)
Glucose, Bld: 94 mg/dL (ref 70–99)
Potassium: 3.6 mEq/L (ref 3.5–5.1)
Potassium: 3.8 mEq/L (ref 3.5–5.1)
Sodium: 136 mEq/L (ref 135–145)
Sodium: 140 mEq/L (ref 135–145)

## 2010-05-30 LAB — CBC
HCT: 40.5 % (ref 39.0–52.0)
HCT: 42.9 % (ref 39.0–52.0)
Hemoglobin: 13.9 g/dL (ref 13.0–17.0)
Hemoglobin: 14.7 g/dL (ref 13.0–17.0)
MCHC: 34.2 g/dL (ref 30.0–36.0)
MCHC: 34.3 g/dL (ref 30.0–36.0)
MCV: 93.4 fL (ref 78.0–100.0)
MCV: 94 fL (ref 78.0–100.0)
Platelets: 285 10*3/uL (ref 150–400)
Platelets: 289 10*3/uL (ref 150–400)
RBC: 4.33 MIL/uL (ref 4.22–5.81)
RBC: 4.57 MIL/uL (ref 4.22–5.81)
RDW: 13.8 % (ref 11.5–15.5)
RDW: 13.8 % (ref 11.5–15.5)
WBC: 4.9 10*3/uL (ref 4.0–10.5)
WBC: 5.5 10*3/uL (ref 4.0–10.5)

## 2010-05-30 LAB — LIPID PANEL
Cholesterol: 170 mg/dL (ref 0–200)
HDL: 50 mg/dL (ref >39)
LDL Cholesterol: 106 mg/dL — ABNORMAL HIGH (ref 0–99)
Total CHOL/HDL Ratio: 3.4 RATIO
Triglycerides: 72 mg/dL (ref <150)
VLDL: 14 mg/dL (ref 0–40)

## 2010-05-30 LAB — CARDIAC PANEL(CRET KIN+CKTOT+MB+TROPI)
CK, MB: 2.1 ng/mL (ref 0.3–4.0)
CK, MB: 2.2 ng/mL (ref 0.3–4.0)
Relative Index: 1.5 (ref 0.0–2.5)
Relative Index: 1.7 (ref 0.0–2.5)
Total CK: 127 U/L (ref 7–232)
Total CK: 136 U/L (ref 7–232)
Troponin I: 0.01 ng/mL (ref 0.00–0.06)
Troponin I: 0.01 ng/mL (ref 0.00–0.06)

## 2010-05-30 LAB — RAPID URINE DRUG SCREEN, HOSP PERFORMED
Amphetamines: NOT DETECTED
Barbiturates: NOT DETECTED
Benzodiazepines: NOT DETECTED
Cocaine: NOT DETECTED
Opiates: NOT DETECTED
Tetrahydrocannabinol: POSITIVE — AB

## 2010-05-30 LAB — CK TOTAL AND CKMB (NOT AT ARMC)
CK, MB: 3 ng/mL (ref 0.3–4.0)
Relative Index: 2.1 (ref 0.0–2.5)
Total CK: 144 U/L (ref 7–232)

## 2010-05-30 LAB — HEMOGLOBIN A1C
Hgb A1c MFr Bld: 6.1 % (ref 4.6–6.1)
Mean Plasma Glucose: 128 mg/dL

## 2010-05-30 LAB — TROPONIN I: Troponin I: 0.02 ng/mL (ref 0.00–0.06)

## 2010-05-30 LAB — TSH: TSH: 1.37 u[IU]/mL (ref 0.350–4.500)

## 2011-02-02 ENCOUNTER — Other Ambulatory Visit: Payer: Self-pay | Admitting: Family Medicine

## 2011-02-04 NOTE — Telephone Encounter (Signed)
Refill request

## 2011-03-29 ENCOUNTER — Ambulatory Visit (INDEPENDENT_AMBULATORY_CARE_PROVIDER_SITE_OTHER): Payer: Self-pay | Admitting: Family Medicine

## 2011-03-29 ENCOUNTER — Encounter: Payer: Self-pay | Admitting: Family Medicine

## 2011-03-29 DIAGNOSIS — Z1211 Encounter for screening for malignant neoplasm of colon: Secondary | ICD-10-CM

## 2011-03-29 DIAGNOSIS — R042 Hemoptysis: Secondary | ICD-10-CM | POA: Insufficient documentation

## 2011-03-29 DIAGNOSIS — R351 Nocturia: Secondary | ICD-10-CM

## 2011-03-29 DIAGNOSIS — E785 Hyperlipidemia, unspecified: Secondary | ICD-10-CM

## 2011-03-29 DIAGNOSIS — I10 Essential (primary) hypertension: Secondary | ICD-10-CM

## 2011-03-29 DIAGNOSIS — Z125 Encounter for screening for malignant neoplasm of prostate: Secondary | ICD-10-CM

## 2011-03-29 LAB — CBC WITH DIFFERENTIAL/PLATELET
Basophils Absolute: 0 10*3/uL (ref 0.0–0.1)
Basophils Relative: 1 % (ref 0–1)
Eosinophils Absolute: 0.1 10*3/uL (ref 0.0–0.7)
Eosinophils Relative: 2 % (ref 0–5)
HCT: 45 % (ref 39.0–52.0)
Hemoglobin: 15.1 g/dL (ref 13.0–17.0)
Lymphocytes Relative: 38 % (ref 12–46)
Lymphs Abs: 1.6 10*3/uL (ref 0.7–4.0)
MCH: 31.1 pg (ref 26.0–34.0)
MCHC: 33.6 g/dL (ref 30.0–36.0)
MCV: 92.6 fL (ref 78.0–100.0)
Monocytes Absolute: 0.5 10*3/uL (ref 0.1–1.0)
Monocytes Relative: 12 % (ref 3–12)
Neutro Abs: 2 10*3/uL (ref 1.7–7.7)
Neutrophils Relative %: 47 % (ref 43–77)
Platelets: 315 10*3/uL (ref 150–400)
RBC: 4.86 MIL/uL (ref 4.22–5.81)
RDW: 14.6 % (ref 11.5–15.5)
WBC: 4.2 10*3/uL (ref 4.0–10.5)

## 2011-03-29 LAB — COMPREHENSIVE METABOLIC PANEL
ALT: 18 U/L (ref 0–53)
AST: 21 U/L (ref 0–37)
Albumin: 4.5 g/dL (ref 3.5–5.2)
Alkaline Phosphatase: 53 U/L (ref 39–117)
BUN: 16 mg/dL (ref 6–23)
CO2: 27 mEq/L (ref 19–32)
Calcium: 9.7 mg/dL (ref 8.4–10.5)
Chloride: 101 mEq/L (ref 96–112)
Creat: 1.23 mg/dL (ref 0.50–1.35)
Glucose, Bld: 135 mg/dL — ABNORMAL HIGH (ref 70–99)
Potassium: 4 mEq/L (ref 3.5–5.3)
Sodium: 138 mEq/L (ref 135–145)
Total Bilirubin: 0.4 mg/dL (ref 0.3–1.2)
Total Protein: 7.2 g/dL (ref 6.0–8.3)

## 2011-03-29 LAB — POCT UA - MICROSCOPIC ONLY

## 2011-03-29 LAB — POCT URINALYSIS DIPSTICK
Bilirubin, UA: NEGATIVE
Glucose, UA: NEGATIVE
Ketones, UA: NEGATIVE
Leukocytes, UA: NEGATIVE
Nitrite, UA: NEGATIVE
Protein, UA: 30
Spec Grav, UA: 1.025
Urobilinogen, UA: 0.2
pH, UA: 7

## 2011-03-29 LAB — LDL CHOLESTEROL, DIRECT: Direct LDL: 112 mg/dL — ABNORMAL HIGH

## 2011-03-29 MED ORDER — LISINOPRIL-HYDROCHLOROTHIAZIDE 20-25 MG PO TABS: 1.0000 | ORAL_TABLET | Freq: Every day | ORAL | Status: DC

## 2011-03-29 MED ORDER — TERAZOSIN HCL 1 MG PO CAPS: 1.0000 mg | ORAL_CAPSULE | Freq: Every day | ORAL | Status: DC

## 2011-03-29 MED ORDER — PRAVASTATIN SODIUM 20 MG PO TABS: 20.0000 mg | ORAL_TABLET | Freq: Every day | ORAL | Status: DC

## 2011-03-29 NOTE — Patient Instructions (Signed)
It was a pleasure to see you today,. We are doing several important things:  Labs today.   I am starting you on lisinopril/HCT 20/25, take 1 tablet daily for blood pressure.  Call if you feel dizzy, get worsening cough, or swelling in tongue or lips (stop the medicine and call me);  Stool cards for colon cancer screening.  Chest x-ray for the cough with specks of blood.  I will call your home number when I get the result; we will likely need to do a CT scan afterward to follow up.   [facility] Card, please talk with Rudell Cobb about getting this.   I would like to see you back in the next 2 to 3 weeks for follow up. Please make an appointment for this.

## 2011-03-29 NOTE — Assessment & Plan Note (Signed)
Patient with significant (THC) smoking history and now reporting cough with blood and an objective weight loss over several months.  Concern for pulmonary versus head/neck neoplasm.  To start with lab workup, CXR and proceed to high-res CT of chest. Also to consider ENT for laryngoscopic evaluation.  Patient is urged to contact Rudell Cobb to reinstate his [employer], which he states has lapsed.  For follow up in the coming month.

## 2011-03-29 NOTE — Progress Notes (Signed)
  Subjective:    Patient ID: Adam Burton, male    DOB: May 28, 1950, 61 y.o.   MRN: [id number]  HPI Patient comes in today with his wife.  Several concerns:  1. Nocturia is worse; gets up 3-4 times/noc.  Poor stream.  Had been doing better on Cardura 1mg  daily but ran out 1 month ago. Finasteride 5mg  daily did not help, so he stopped taking it.   2. High blood pressure treated with HCTZ 25mg  daily.  Still taking.  Denies chest pain or pressure, no palpitations.   3. Peristent cough for prolonged period of time (not specific).  Cough is associated with seeing "flecks of blood" in sputum.  Never been a tobacco smoker, but does smoke THC one time daily.  Wife remarks that he is occasionally hoarse as well.   Family Hx: Mother, MGM and maternal aunt with DM; mother with HTN as well. No colorectal or prostate cancer in family.  No CVA history in family.   Review of Systems  Wife reports that patient has lost weight (patient denies).  Objective review of medical record shows a 13# loss since Oct 2011.  Denies sweats or chills, or fatigue. Denies changes in bowel habits or blood in stool.  Reports that he has done stool cards in the past, however I am unable to locate records of this in the EMR.     Objective:   Physical Exam Well appearing, no apparent distress.  HEENT Neck supple.  No cervical or supraclavicular adenopathy. No thyroid nodules or tenderness.  Moist mucus membranes. Voice not hoarse. TMs clear bilaterally.  COR S1S2, no extra sounds or murmurs appreciated PULM Clear bilaterally, no rales or wheezes.  ABD Soft, nontender, nondistended. No masses or organomegaly. Normal bowel sounds. GU: Prostate enlargement without nodularity.  LEs No ankle edema; palpable dp pulses bilaterally.        Assessment & Plan:

## 2011-03-29 NOTE — Assessment & Plan Note (Signed)
Stool cards today for colon cancer screening

## 2011-03-29 NOTE — Assessment & Plan Note (Signed)
Adding lisinopril to HCTZ today; checking renal function today, will follow up in 2 to 3 weeks

## 2011-03-29 NOTE — Assessment & Plan Note (Signed)
Check LDL-direct. nonfasting today.

## 2011-03-29 NOTE — Assessment & Plan Note (Signed)
Likely BPH in origin.  UA today, as well as pSA (prior value from 2011 noted).  Restart Cardura, hold finasteride for now.

## 2011-03-29 NOTE — Assessment & Plan Note (Signed)
To check PSA today

## 2011-03-30 LAB — PSA: PSA: 2.22 ng/mL (ref <=4.00)

## 2011-04-05 ENCOUNTER — Ambulatory Visit
Admission: RE | Admit: 2011-04-05 | Discharge: 2011-04-05 | Disposition: A | Discharge status: Home / Self Care | Payer: No Typology Code available for payment source | Source: Ambulatory Visit | Attending: Family Medicine | Admitting: Family Medicine

## 2011-04-05 LAB — HEMOCCULT GUIAC POC 1CARD (OFFICE)

## 2011-04-05 NOTE — Progress Notes (Signed)
Addended by: Jennette Bill on: 04/05/2011 04:26 PM   Modules accepted: Orders

## 2011-04-06 ENCOUNTER — Telehealth: Payer: Self-pay | Admitting: Family Medicine

## 2011-04-06 DIAGNOSIS — Z1211 Encounter for screening for malignant neoplasm of colon: Secondary | ICD-10-CM

## 2011-04-06 NOTE — Telephone Encounter (Signed)
Called patient at his home to report negative chest x-ray, also to tell him that his stool cards could not be read due to excessive stool on cards.  Left voice message for him to call back.   I had told him at visit that despite anticipated negative CXR results, I plan to order a CT chest to work up his complaint of BLOODY SPUTUM WITH COUGH.  I am entering this order today.   Will also want ENT referral for laryngoscopy to work up hemoptysis after CT chest is done.  Paula Compton, MD

## 2011-04-08 NOTE — Telephone Encounter (Signed)
Pt will come in Wed to get stool card and let nurse know when he can have CT done - also will bring insurance card

## 2011-04-08 NOTE — Telephone Encounter (Signed)
Left message for patient to return call. Need to know what day he is available for chest CT. Also he needs to come in to recollect stool cards, the ones he brought back were unable to be read due to being collected incorrectly, he will ne to be instructed on how to do these. Please also ask patient to bring in a copy of his insurance card as there is NOT one scanned into the chart, this information will be needed in order to do prior authorization.Busick, Rodena Medin

## 2011-04-09 NOTE — Telephone Encounter (Signed)
Pt is asking to speak with Dr Mauricio Po in regards to phn message on Saturday

## 2011-04-19 ENCOUNTER — Ambulatory Visit (HOSPITAL_COMMUNITY)
Admission: RE | Admit: 2011-04-19 | Discharge: 2011-04-19 | Disposition: A | Discharge status: Home / Self Care | Payer: Self-pay | Source: Ambulatory Visit | Attending: Family Medicine | Admitting: Family Medicine

## 2011-04-19 ENCOUNTER — Inpatient Hospital Stay (HOSPITAL_COMMUNITY): Payer: Self-pay

## 2011-04-19 ENCOUNTER — Ambulatory Visit (INDEPENDENT_AMBULATORY_CARE_PROVIDER_SITE_OTHER): Payer: No Typology Code available for payment source | Admitting: Family Medicine

## 2011-04-19 ENCOUNTER — Encounter (HOSPITAL_COMMUNITY): Payer: Self-pay | Admitting: Radiology

## 2011-04-19 ENCOUNTER — Encounter: Payer: Self-pay | Admitting: Family Medicine

## 2011-04-19 ENCOUNTER — Other Ambulatory Visit: Payer: Self-pay

## 2011-04-19 ENCOUNTER — Inpatient Hospital Stay (HOSPITAL_COMMUNITY)
Admission: AD | Admit: 2011-04-19 | Discharge: 2011-04-20 | DRG: 313 | Disposition: A | Discharge status: Home / Self Care | Payer: Self-pay | Source: Ambulatory Visit | Attending: Family Medicine | Admitting: Family Medicine

## 2011-04-19 DIAGNOSIS — I10 Essential (primary) hypertension: Secondary | ICD-10-CM

## 2011-04-19 DIAGNOSIS — R079 Chest pain, unspecified: Principal | ICD-10-CM | POA: Diagnosis present

## 2011-04-19 DIAGNOSIS — R0789 Other chest pain: Secondary | ICD-10-CM

## 2011-04-19 DIAGNOSIS — F1210 Cannabis abuse, uncomplicated: Secondary | ICD-10-CM | POA: Diagnosis present

## 2011-04-19 DIAGNOSIS — Z1211 Encounter for screening for malignant neoplasm of colon: Secondary | ICD-10-CM

## 2011-04-19 DIAGNOSIS — R94120 Abnormal auditory function study: Secondary | ICD-10-CM | POA: Insufficient documentation

## 2011-04-19 DIAGNOSIS — E785 Hyperlipidemia, unspecified: Secondary | ICD-10-CM | POA: Diagnosis present

## 2011-04-19 DIAGNOSIS — R042 Hemoptysis: Secondary | ICD-10-CM | POA: Diagnosis present

## 2011-04-19 DIAGNOSIS — F17200 Nicotine dependence, unspecified, uncomplicated: Secondary | ICD-10-CM | POA: Diagnosis present

## 2011-04-19 DIAGNOSIS — F1410 Cocaine abuse, uncomplicated: Secondary | ICD-10-CM | POA: Diagnosis present

## 2011-04-19 DIAGNOSIS — I209 Angina pectoris, unspecified: Secondary | ICD-10-CM | POA: Insufficient documentation

## 2011-04-19 DIAGNOSIS — F1910 Other psychoactive substance abuse, uncomplicated: Secondary | ICD-10-CM

## 2011-04-19 HISTORY — DX: Essential (primary) hypertension: I10

## 2011-04-19 LAB — COMPREHENSIVE METABOLIC PANEL
ALT: 32 U/L (ref 0–53)
AST: 32 U/L (ref 0–37)
Albumin: 3.8 g/dL (ref 3.5–5.2)
Alkaline Phosphatase: 69 U/L (ref 39–117)
BUN: 13 mg/dL (ref 6–23)
CO2: 27 mEq/L (ref 19–32)
Calcium: 9.5 mg/dL (ref 8.4–10.5)
Chloride: 102 mEq/L (ref 96–112)
Creatinine, Ser: 1.18 mg/dL (ref 0.50–1.35)
GFR calc Af Amer: 75 mL/min — ABNORMAL LOW (ref >90)
GFR calc non Af Amer: 64 mL/min — ABNORMAL LOW (ref >90)
Glucose, Bld: 67 mg/dL — ABNORMAL LOW (ref 70–99)
Potassium: 3.8 mEq/L (ref 3.5–5.1)
Sodium: 139 mEq/L (ref 135–145)
Total Bilirubin: 0.6 mg/dL (ref 0.3–1.2)
Total Protein: 7.6 g/dL (ref 6.0–8.3)

## 2011-04-19 LAB — HEMOCCULT GUIAC POC 1CARD (OFFICE)

## 2011-04-19 LAB — DIFFERENTIAL
Basophils Absolute: 0 10*3/uL (ref 0.0–0.1)
Basophils Relative: 0 % (ref 0–1)
Eosinophils Absolute: 0.1 10*3/uL (ref 0.0–0.7)
Eosinophils Relative: 1 % (ref 0–5)
Lymphocytes Relative: 16 % (ref 12–46)
Lymphs Abs: 2 10*3/uL (ref 0.7–4.0)
Monocytes Absolute: 1.8 10*3/uL — ABNORMAL HIGH (ref 0.1–1.0)
Monocytes Relative: 14 % — ABNORMAL HIGH (ref 3–12)
Neutro Abs: 8.7 10*3/uL — ABNORMAL HIGH (ref 1.7–7.7)
Neutrophils Relative %: 69 % (ref 43–77)

## 2011-04-19 LAB — RAPID URINE DRUG SCREEN, HOSP PERFORMED
Amphetamines: NOT DETECTED
Barbiturates: NOT DETECTED
Benzodiazepines: NOT DETECTED
Cocaine: POSITIVE — AB
Opiates: NOT DETECTED
Tetrahydrocannabinol: POSITIVE — AB

## 2011-04-19 LAB — CARDIAC PANEL(CRET KIN+CKTOT+MB+TROPI)
CK, MB: 2.1 ng/mL (ref 0.3–4.0)
CK, MB: 1.6 ng/mL (ref 0.3–4.0)
Relative Index: 1.3 (ref 0.0–2.5)
Relative Index: 1.2 (ref 0.0–2.5)
Total CK: 159 U/L (ref 7–232)
Total CK: 139 U/L (ref 7–232)
Troponin I: <0.3 ng/mL (ref <0.30)
Troponin I: <0.3 ng/mL (ref <0.30)

## 2011-04-19 LAB — TSH: TSH: 0.539 u[IU]/mL (ref 0.350–4.500)

## 2011-04-19 LAB — CBC
HCT: 40.5 % (ref 39.0–52.0)
Hemoglobin: 14.3 g/dL (ref 13.0–17.0)
MCH: 31.8 pg (ref 26.0–34.0)
MCHC: 35.3 g/dL (ref 30.0–36.0)
MCV: 90 fL (ref 78.0–100.0)
Platelets: 303 10*3/uL (ref 150–400)
RBC: 4.5 MIL/uL (ref 4.22–5.81)
RDW: 13.7 % (ref 11.5–15.5)
WBC: 12.6 10*3/uL — ABNORMAL HIGH (ref 4.0–10.5)

## 2011-04-19 LAB — HEMOGLOBIN A1C
Hgb A1c MFr Bld: 6.1 % — ABNORMAL HIGH (ref <5.7)
Mean Plasma Glucose: 128 mg/dL — ABNORMAL HIGH (ref <117)

## 2011-04-19 MED ORDER — NITROGLYCERIN 0.4 MG SL SUBL: 0.4000 mg | SUBLINGUAL_TABLET | SUBLINGUAL | Status: DC | PRN

## 2011-04-19 MED ORDER — NITROGLYCERIN 0.4 MG SL SUBL
0.4000 mg | SUBLINGUAL_TABLET | Freq: Once | SUBLINGUAL | Status: AC
  Administered 2011-04-19: 0.4 mg via SUBLINGUAL

## 2011-04-19 MED ORDER — ACETAMINOPHEN 325 MG PO TABS
650.0000 mg | ORAL_TABLET | Freq: Four times a day (QID) | ORAL | Status: DC | PRN
  Administered 2011-04-19: 650 mg via ORAL
  Filled 2011-04-19: qty 2

## 2011-04-19 MED ORDER — HYDROCHLOROTHIAZIDE 25 MG PO TABS
25.0000 mg | ORAL_TABLET | Freq: Every day | ORAL | Status: DC
  Administered 2011-04-19 – 2011-04-20 (×2): 25 mg via ORAL
  Filled 2011-04-19 (×2): qty 1

## 2011-04-19 MED ORDER — SODIUM CHLORIDE 0.9 % IV SOLN: 250.0000 mL | INTRAVENOUS | Status: DC | PRN

## 2011-04-19 MED ORDER — TERAZOSIN HCL 1 MG PO CAPS
1.0000 mg | ORAL_CAPSULE | Freq: Every day | ORAL | Status: DC
  Administered 2011-04-19: 1 mg via ORAL
  Filled 2011-04-19 (×2): qty 1

## 2011-04-19 MED ORDER — LISINOPRIL-HYDROCHLOROTHIAZIDE 20-25 MG PO TABS: 1.0000 | ORAL_TABLET | Freq: Every day | ORAL | Status: DC

## 2011-04-19 MED ORDER — SODIUM CHLORIDE 0.9 % IJ SOLN
3.0000 mL | Freq: Two times a day (BID) | INTRAMUSCULAR | Status: DC
  Administered 2011-04-19: 3 mL via INTRAVENOUS

## 2011-04-19 MED ORDER — ENOXAPARIN SODIUM 40 MG/0.4ML ~~LOC~~ SOLN
40.0000 mg | SUBCUTANEOUS | Status: DC
  Administered 2011-04-19: 40 mg via SUBCUTANEOUS
  Filled 2011-04-19 (×2): qty 0.4

## 2011-04-19 MED ORDER — ASPIRIN 325 MG PO TABS: 325.0000 mg | ORAL_TABLET | Freq: Every day | ORAL | Status: DC

## 2011-04-19 MED ORDER — ASPIRIN 325 MG PO TABS
325.0000 mg | ORAL_TABLET | Freq: Once | ORAL | Status: AC
  Administered 2011-04-19: 325 mg via ORAL

## 2011-04-19 MED ORDER — ASPIRIN EC 81 MG PO TBEC
81.0000 mg | DELAYED_RELEASE_TABLET | Freq: Every day | ORAL | Status: DC
  Administered 2011-04-20: 81 mg via ORAL
  Filled 2011-04-19: qty 1

## 2011-04-19 MED ORDER — HYDRALAZINE HCL 20 MG/ML IJ SOLN
10.0000 mg | INTRAMUSCULAR | Status: DC | PRN
  Filled 2011-04-19: qty 0.5

## 2011-04-19 MED ORDER — NITROGLYCERIN 0.3 MG SL SUBL: 0.4000 mg | SUBLINGUAL_TABLET | Freq: Once | SUBLINGUAL | Status: DC

## 2011-04-19 MED ORDER — IOHEXOL 300 MG/ML  SOLN
100.0000 mL | Freq: Once | INTRAMUSCULAR | Status: AC | PRN
  Administered 2011-04-19: 100 mL via INTRAVENOUS

## 2011-04-19 MED ORDER — SIMVASTATIN 10 MG PO TABS
10.0000 mg | ORAL_TABLET | Freq: Every day | ORAL | Status: DC
  Administered 2011-04-19: 10 mg via ORAL
  Filled 2011-04-19 (×2): qty 1

## 2011-04-19 MED ORDER — LISINOPRIL 20 MG PO TABS
20.0000 mg | ORAL_TABLET | Freq: Every day | ORAL | Status: DC
  Administered 2011-04-19 – 2011-04-20 (×2): 20 mg via ORAL
  Filled 2011-04-19 (×2): qty 1

## 2011-04-19 MED ORDER — SODIUM CHLORIDE 0.9 % IJ SOLN: 3.0000 mL | INTRAMUSCULAR | Status: DC | PRN

## 2011-04-19 NOTE — H&P (Signed)
Adam Burton is an 61 y.o. male.   Chief Complaint:  Chest pain HPI:  3 day history of substernal chest pain with radiation down his left arm. He describes the pain as pressure. He has no radiation to the back. He has no association of pain to food or marijuana smoking. He does not smoke tobacco. He is a non-drinker and does not use other drugs. He has had this pain in the past. He has a cardiac cath in the past and a stress test (2 years ago) that were both negative. The patient takes asa 81 mg daily. Denies GERD symptoms.  He has no other symptoms. His vitals are wnl at this time.  He has also complained of cough and hemoptysis without prior history of TB.   Social History:  reports that he has never smoked. He does not have any smokeless tobacco history on file. His alcohol and drug histories not on file. Patient Active Problem List  Diagnoses  . DYSLIPIDEMIA  . HYPERTENSION  . 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 SHORTNESS OF BREATH  . NOCTURIA  . OTHER ABNORMAL GLUCOSE  . LUMBAR STRAIN  . HYPERTENSION NEC  . Hemoptysis  . Colon cancer screening  . Prostate cancer screening  . Chest pain    Allergies: No Known Allergies  Medications Prior to Admission  Medication Dose Route Frequency Provider Last Rate Last Dose  . 0.9 %  sodium chloride infusion  250 mL Intravenous PRN Marena Chancy, MD      . aspirin EC tablet 81 mg  81 mg Oral Daily Marena Chancy, MD      . enoxaparin (LOVENOX) injection 40 mg  40 mg Subcutaneous Q24H Marena Chancy, MD      . hydrALAZINE (APRESOLINE) injection 10 mg  10 mg Intravenous Q4H PRN Marena Chancy, MD      . lisinopril (PRINIVIL,ZESTRIL) tablet 20 mg  20 mg Oral Daily Carney Living, MD       And  . hydrochlorothiazide (HYDRODIURIL) tablet 25 mg  25 mg Oral Daily Carney Living, MD      . nitroGLYCERIN (NITROSTAT) SL tablet 0.4 mg  0.4 mg Sublingual Q5 Min x 3 PRN Marena Chancy, MD      . simvastatin (ZOCOR) tablet  10 mg  10 mg Oral q1800 Marena Chancy, MD      . sodium chloride 0.9 % injection 3 mL  3 mL Intravenous Q12H Marena Chancy, MD      . sodium chloride 0.9 % injection 3 mL  3 mL Intravenous PRN Marena Chancy, MD      . terazosin (HYTRIN) capsule 1 mg  1 mg Oral QHS Marena Chancy, MD      . DISCONTD: lisinopril-hydrochlorothiazide (PRINZIDE,ZESTORETIC) 20-25 MG per tablet 1 tablet  1 tablet Oral Daily Marena Chancy, MD       Medications Prior to Admission  Medication Sig Dispense Refill  . lisinopril-hydrochlorothiazide (PRINZIDE,ZESTORETIC) 20-25 MG per tablet Take 1 tablet by mouth daily.  90 tablet  3  . pravastatin (PRAVACHOL) 20 MG tablet Take 1 tablet (20 mg total) by mouth daily.  30 tablet  6  . terazosin (HYTRIN) 1 MG capsule Take 1 capsule (1 mg total) by mouth at bedtime.  30 capsule  6    Results for orders placed during the hospital encounter of 04/19/11 (from the past 48 hour(s))  CBC     Status: Abnormal   Collection Time   04/19/11  1:15 PM      Component Value Range Comment   WBC 12.6 (*) 4.0 - 10.5 (K/uL)    RBC 4.50  4.22 - 5.81 (MIL/uL)    Hemoglobin 14.3  13.0 - 17.0 (g/dL)    HCT 16.1  09.6 - 04.5 (%)    MCV 90.0  78.0 - 100.0 (fL)    MCH 31.8  26.0 - 34.0 (pg)    MCHC 35.3  30.0 - 36.0 (g/dL)    RDW 40.9  81.1 - 91.4 (%)    Platelets 303  150 - 400 (K/uL)   DIFFERENTIAL     Status: Abnormal   Collection Time   04/19/11  1:15 PM      Component Value Range Comment   Neutrophils Relative 69  43 - 77 (%)    Neutro Abs 8.7 (*) 1.7 - 7.7 (K/uL)    Lymphocytes Relative 16  12 - 46 (%)    Lymphs Abs 2.0  0.7 - 4.0 (K/uL)    Monocytes Relative 14 (*) 3 - 12 (%)    Monocytes Absolute 1.8 (*) 0.1 - 1.0 (K/uL)    Eosinophils Relative 1  0 - 5 (%)    Eosinophils Absolute 0.1  0.0 - 0.7 (K/uL)    Basophils Relative 0  0 - 1 (%)    Basophils Absolute 0.0  0.0 - 0.1 (K/uL)    No results found.  ROS Pertinent items are noted in HPI. No fever, chills, night sweats,  weight loss.  Blood pressure 135/86, pulse 70, temperature 98.7 F (37.1 C), temperature source Oral, resp. rate 18, height 5\' 11"  (1.803 m), weight 163 lb 5.8 oz (74.1 kg), SpO2 100.00%. Physical Exam  General: aam, nad, normal communication. aox3 Lungs:  Normal respiratory effort, chest expands symmetrically. Lungs are clear to auscultation, no crackles or wheezes. Heart - Regular rate and rhythm.  No murmurs, gallops or rubs.    Abdomen: soft and non-tender without masses, organomegaly or hernias noted.  No guarding or rebound Extremities:  No cyanosis, edema, or deformity noted  Skin:  Intact without suspicious lesions or rashes Mouth - no lesions, mucous membranes are moist, no decaying teeth   Assessment/Plan 61 y/o aam with history of chest pain coming with acute chest pain for rule out of MI. Also with cough and hemoptysis.  1. Chest pain No MSK. Typical in nature.  EKG negative at [facility]. Will obtain cardiac enzymes x 3 and repeat ekg in am C/w asa. Telemetry Responded to nitro tab. -continue prn  2. Hemoptysis Will obtain CT chest with IV contrast to rule out PE and underlying malignancy vs. TB. I think with chest pain, hemoptysis, 61 y/o, long standing smoking history he warrants a CT of the chest. His WBC is also elevated at 12.6. He has no tachycardia or SOB, but he does have chest pain and hemoptysis.  3. HTN Will resume home medications tomorrow morning, he took his meds this am.  Hydralazine PRN  4. Marijuana use Long-standing - advised to quit   5. FEN-GI Regular diet,  lovenox  protonix   6. Dispo: Pending Cardiac rule out, results.  Edd Arbour MD 04/19/2011, 2:13 PM

## 2011-04-19 NOTE — H&P (Signed)
FMTS Attending Admit Note  Patient seen by me in the [facility] today, directed for inpatient admission for evaluation of chest tightness with radiation to back and L arm.  Patient had made appointment to follow up on cough/hemoptysis; one of the items on our to-do list prior to the presentation of the chest pain was to evaluate for CT chest.  Patient's chest pain improved with NTG sublingual x1 (From pain score 5 to 2).   Assessment/Plan: 61 yo male with left-sided chest pressure and associated left arm pain/scapular pain.  Admission for rule-out MI.  In light of his cardiac risk factors (male, age, smoking history, hypertension and hyperlipidemia), his risk profile is such that even if he rules out, will benefit from further evaluation (exercise stress test or myoview). Paula Compton, M.D.

## 2011-04-19 NOTE — Progress Notes (Signed)
Adam Burton is a 61 y.o. male with PMHx of htn and hld who presents to [facility] today for chest pain and left arm pain.  Pt reports that he has had tightness and pain in his left shoulder for the past 3 days.  The pain increased this morning.  He rates it currently at a 5/10. He reports that he took Aleve which helped relieve some pain.  He reports that his chest pain comes and goes and is the worst when he is lying down, especially on his left side.  He reports that he usually has the chest pain at night and does not notice that it is usually bothersome during the day.  He reports that he has had this chest tightness for several weeks.  Pt reports that he takes one ASA 81 mg daily along with his bp and hld meds.  Denies SOB, diaphoresis, nausea.   PMH reviewed.  ROS as above otherwise neg Medications reviewed. Current Outpatient Prescriptions  Medication Sig Dispense Refill  . lisinopril-hydrochlorothiazide (PRINZIDE,ZESTORETIC) 20-25 MG per tablet Take 1 tablet by mouth daily.  90 tablet  3  . pravastatin (PRAVACHOL) 20 MG tablet Take 1 tablet (20 mg total) by mouth daily.  30 tablet  6  . terazosin (HYTRIN) 1 MG capsule Take 1 capsule (1 mg total) by mouth at bedtime.  30 capsule  6    Exam:  BP 133/80  Pulse 62  Ht 5\' 11"  (1.803 m)  Wt 174 lb (78.926 kg)  BMI 24.27 kg/m2  SpO2 98% Gen: Well NAD Lungs: CTABL no wheezing/rhonchi/rales Heart: RRR no MRG Abd: NABS, NT, ND Exts: Non edematous BL  LE, warm and well perfused.  MSK: no obvious deformities, not tender to palpation, upper extremities 5/5 strength bilaterally, sensation intact, negative empty can test, ROM in left shoulder decreased  EKG: no acute abnormalities, no ST elevations or T wave abnormalities; unchanged from EKG in 2010 Repeat bp (following nitroglycerin): 130/86, O2 sat: 98% on ra  10 minutes after administration of Nitroglycerin, patient reports that chest pain has decreased from a 5/10 to a 2/10.  Assessment and  Plan: [name] is a 61 yo M with PMHx of htn and hld who presents to PCP for follow-up of htn but is also experiencing chest pain and left arm pain.  Differential for chest pain including MI, angina, cardiac arrhythmia, GERD, URI, and muscle strain.  Due to [name]'s PMHx which includes several cardiac risk factors (male, hx of smoking, htn, hld, abnormal fasting glucose), we believe that it is necessary to admit him to the hospital for observation.  Although EKG was not significant for any acute findings, MIs do not always provoke changes in EKG. If the patient remains symptomatic, it is recommended that EKGs be repeated every few minutes.  1. Administered ASA 325 mg in office and 1 sublingual nitroglycerin in office. 2. Admit to hospital for observation. Cycle CEs and repeat EKG  3. CBC, BMP  Gerlene Fee, MS3

## 2011-04-19 NOTE — Progress Notes (Signed)
Addended by: Barbaraann Barthel on: 04/19/2011 03:11 PM   Modules accepted: Level of Service

## 2011-04-19 NOTE — Progress Notes (Signed)
Addended by: [place], Makela Niehoff on: 04/19/2011 03:51 PM   Modules accepted: Orders

## 2011-04-19 NOTE — Progress Notes (Signed)
Pt admitted from clinic to room 4733-1. Oriented pt to room and placed call bell in reach. VS WNL. Notified clinic of pt's arrival then completed assessment. Will continue to monitor pt closely.

## 2011-04-19 NOTE — Progress Notes (Signed)
  Subjective:    Patient ID: Adam Burton, male    DOB: [birth year], 61 y.o.   MRN: [id number]  HPI Patient initially made appt for follow up of cough with hemoptysis, today reports onset chest pain in L side of chest, radiating to L arm and to L scapula.  Chest pain is "pressure'like", the arm and scapular pain is "lancing".  No diaphoresis, no nausea, no presyncope, no dyspnea.  Pain not exacerbated by deep breathing (not pleuritic).  Continues with cough; no change since adding the lisinopril to HCTZ.     Review of Systems See above.     Objective:   Physical Exam Alert, in no apparent distress HEENT Neck supple without JVD. No cervical adenopathy COR S1S2, no extra sounds.  No chest tenderness to palpation.  PULM  Clear bilaterally, no rales or wheezes ABD Soft, nontender, nondistended.  LEs; No edema.   ECG today: NSR, no ST-T changes or new Q waves in comparison with old ECG from 2010 in [place].      Assessment & Plan:

## 2011-04-20 ENCOUNTER — Other Ambulatory Visit: Payer: Self-pay

## 2011-04-20 LAB — LIPID PANEL
Cholesterol: 162 mg/dL (ref 0–200)
HDL: 79 mg/dL (ref >39)
LDL Cholesterol: 72 mg/dL (ref 0–99)
Total CHOL/HDL Ratio: 2.1 RATIO
Triglycerides: 57 mg/dL (ref <150)
VLDL: 11 mg/dL (ref 0–40)

## 2011-04-20 LAB — BASIC METABOLIC PANEL
BUN: 11 mg/dL (ref 6–23)
CO2: 27 mEq/L (ref 19–32)
Calcium: 9.2 mg/dL (ref 8.4–10.5)
Chloride: 98 mEq/L (ref 96–112)
Creatinine, Ser: 1.32 mg/dL (ref 0.50–1.35)
GFR calc Af Amer: 65 mL/min — ABNORMAL LOW (ref >90)
GFR calc non Af Amer: 56 mL/min — ABNORMAL LOW (ref >90)
Glucose, Bld: 129 mg/dL — ABNORMAL HIGH (ref 70–99)
Potassium: 3.6 mEq/L (ref 3.5–5.1)
Sodium: 135 mEq/L (ref 135–145)

## 2011-04-20 LAB — CBC
HCT: 37.5 % — ABNORMAL LOW (ref 39.0–52.0)
Hemoglobin: 13.1 g/dL (ref 13.0–17.0)
MCH: 31 pg (ref 26.0–34.0)
MCHC: 34.9 g/dL (ref 30.0–36.0)
MCV: 88.9 fL (ref 78.0–100.0)
Platelets: 329 10*3/uL (ref 150–400)
RBC: 4.22 MIL/uL (ref 4.22–5.81)
RDW: 13.8 % (ref 11.5–15.5)
WBC: 15.7 10*3/uL — ABNORMAL HIGH (ref 4.0–10.5)

## 2011-04-20 LAB — CARDIAC PANEL(CRET KIN+CKTOT+MB+TROPI)
CK, MB: 1.3 ng/mL (ref 0.3–4.0)
Relative Index: 1.2 (ref 0.0–2.5)
Total CK: 109 U/L (ref 7–232)
Troponin I: <0.3 ng/mL (ref <0.30)

## 2011-04-20 MED ORDER — SIMVASTATIN 10 MG PO TABS
10.0000 mg | ORAL_TABLET | Freq: Every day | ORAL | Status: DC
  Filled 2011-04-20: qty 1

## 2011-04-20 MED ORDER — HYDROCHLOROTHIAZIDE 25 MG PO TABS: 25.0000 mg | ORAL_TABLET | Freq: Every day | ORAL | Status: DC

## 2011-04-20 MED ORDER — LISINOPRIL-HYDROCHLOROTHIAZIDE 20-25 MG PO TABS: 1.0000 | ORAL_TABLET | Freq: Every day | ORAL | Status: DC

## 2011-04-20 MED ORDER — LISINOPRIL 20 MG PO TABS: 20.0000 mg | ORAL_TABLET | Freq: Every day | ORAL | Status: DC

## 2011-04-20 MED ORDER — TERAZOSIN HCL 1 MG PO CAPS
1.0000 mg | ORAL_CAPSULE | Freq: Every day | ORAL | Status: DC
  Filled 2011-04-20: qty 1

## 2011-04-20 MED ORDER — NITROGLYCERIN 0.4 MG SL SUBL: 0.4000 mg | SUBLINGUAL_TABLET | Freq: Once | SUBLINGUAL | Status: DC

## 2011-04-20 MED ORDER — ASPIRIN 81 MG PO TBEC: 81.0000 mg | DELAYED_RELEASE_TABLET | Freq: Every day | ORAL | Status: AC

## 2011-04-20 MED ORDER — NITROGLYCERIN 0.4 MG SL SUBL: 0.4000 mg | SUBLINGUAL_TABLET | SUBLINGUAL | Status: DC | PRN

## 2011-04-20 NOTE — Discharge Instructions (Signed)
Chest Pain (Nonspecific) It is often hard to give a specific diagnosis for the cause of chest pain. There is always a chance that your pain could be related to something serious, such as a heart attack or a blood clot in the lungs. You need to follow up with your caregiver for further evaluation. CAUSES   Heartburn.   Pneumonia or bronchitis.   Anxiety and stress.   Inflammation around your heart (pericarditis) or lung (pleuritis or pleurisy).   A blood clot in the lung.   A collapsed lung (pneumothorax). It can develop suddenly on its own (spontaneous pneumothorax) or from injury (trauma) to the chest.  The chest wall is composed of bones, muscles, and cartilage. Any of these can be the source of the pain.  The bones can be bruised by injury.   The muscles or cartilage can be strained by coughing or overwork.   The cartilage can be affected by inflammation and become sore (costochondritis).  DIAGNOSIS  Lab tests or other studies, such as X-rays, an EKG, stress testing, or cardiac imaging, may be needed to find the cause of your pain.  TREATMENT   Treatment depends on what may be causing your chest pain. Treatment may include:   Acid blockers for heartburn.   Anti-inflammatory medicine.   Pain medicine for inflammatory conditions.   Antibiotics if an infection is present.   You may be advised to change lifestyle habits. This includes stopping smoking and avoiding caffeine and chocolate.   You may be advised to keep your head raised (elevated) when sleeping. This reduces the chance of acid going backward from your stomach into your esophagus.   Most of the time, nonspecific chest pain will improve within 2 to 3 days with rest and mild pain medicine.  HOME CARE INSTRUCTIONS   If antibiotics were prescribed, take the full amount even if you start to feel better.   For the next few days, avoid physical activities that bring on chest pain. Continue physical activities as  directed.   Do not smoke cigarettes or drink alcohol until your symptoms are gone.   Only take over-the-counter or prescription medicine for pain, discomfort, or fever as directed by your caregiver.   Follow your caregiver's suggestions for further testing if your chest pain does not go away.   Keep any follow-up appointments you made. If you do not go to an appointment, you could develop lasting (chronic) problems with pain. If there is any problem keeping an appointment, you must call to reschedule.  SEEK MEDICAL CARE IF:   You think you are having problems from the medicine you are taking. Read your medicine instructions carefully.   Your chest pain does not go away, even after treatment.   You develop a rash with blisters on your chest.  SEEK IMMEDIATE MEDICAL CARE IF:   You have increased chest pain or pain that spreads to your arm, neck, jaw, back, or belly (abdomen).   You develop shortness of breath, an increasing cough, or you are coughing up blood.   You have severe back or abdominal pain, feel sick to your stomach (nauseous) or throw up (vomit).   You develop severe weakness, fainting, or chills.   You have an oral temperature above 102 F (38.9 C), not controlled by medicine.  THIS IS AN EMERGENCY. Do not wait to see if the pain will go away. Get medical help at once. Call your local emergency services (911 in [place] Do not drive yourself to   the hospital. MAKE SURE YOU:   Understand these instructions.   Will watch your condition.   Will get help right away if you are not doing well or get worse.  Document Released: 11/21/2004 Document Revised: 10/24/2010 Document Reviewed: 09/17/2007 Exit[employer] Patient Information 2012 [employer]. 

## 2011-04-20 NOTE — Progress Notes (Signed)
Nursing: DC IV, DC Tele, DC Home. Discharge instructions and home medications discussed with patient. Patient denies any questions and concerns at this time. Patient leaving unit ambulatory and appears in no acute distress.

## 2011-04-20 NOTE — Progress Notes (Signed)
Subjective: No o/n issues. Chest pain resolved.  Objective: Vital signs in last 24 hours: Temp:  [98.7 F (37.1 C)-100.2 F (37.9 C)] 99.3 F (37.4 C) (02/23 0548) Pulse Rate:  [62-76] 76  (02/23 0548) Resp:  [18] 18  (02/23 0548) BP: (94-135)/(53-86) 94/53 mmHg (02/23 0548) SpO2:  [98 %-100 %] 99 % (02/23 0548) Weight:  [161 lb 13.1 oz (73.4 kg)-174 lb (78.926 kg)] 161 lb 13.1 oz (73.4 kg) (02/23 0548) Weight change:  Last BM Date: 04/19/11  Intake/Output from previous day: 02/22 0701 - 02/23 0700 In: 360 [P.O.:360] Out: 925 [Urine:925] Intake/Output this shift:   Filed Vitals:   04/19/11 1214 04/19/11 2106 04/20/11 0548  BP: 135/86 105/66 94/53  Pulse: 70 70 76  Temp: 98.7 F (37.1 C) 100.2 F (37.9 C) 99.3 F (37.4 C)  TempSrc: Oral    Resp: 18 18 18   Height: 5\' 11"  (1.803 m)    Weight: 163 lb 5.8 oz (74.1 kg)  161 lb 13.1 oz (73.4 kg)  SpO2: 100% 100% 99%   General: aam, nad, normal communication. aox3  Lungs: Normal respiratory effort, chest expands symmetrically. Lungs are clear to auscultation, no crackles or wheezes.  Heart - Regular rate and rhythm. No murmurs, gallops or rubs.  Extremities: No cyanosis, edema, or deformity noted   Lab Results:  Basename 04/20/11 0600 04/19/11 1315  WBC 15.7* 12.6*  HGB 13.1 14.3  HCT 37.5* 40.5  PLT 329 303   BMET  Basename 04/20/11 0600 04/19/11 1315  NA 135 139  K 3.6 3.8  CL 98 102  CO2 27 27  GLUCOSE 129* 67*  BUN 11 13  CREATININE 1.32 1.18  CALCIUM 9.2 9.5    Studies/Results: X-ray Chest Pa And Lateral   04/19/2011  *RADIOLOGY REPORT*  Clinical Data: Chest pain, short of breath, weakness  CHEST - 2 VIEW  Comparison: Chest x-ray of 04/05/2011  Findings: The lungs are clear.  Mediastinal contours are stable. The heart is within upper limits of normal.  There are degenerative changes throughout the thoracic spine.  IMPRESSION: No active lung disease.  Original Report Authenticated By: Juline Patch, M.D.     Ct Angio Chest W/cm &/or Wo Cm  04/19/2011  *RADIOLOGY REPORT*  Clinical Data: Chest pain.  CT ANGIOGRAPHY CHEST  Technique:  Multidetector CT imaging of the chest using the standard protocol during bolus administration of intravenous contrast. Multiplanar reconstructed images including MIPs were obtained and reviewed to evaluate the vascular anatomy.  Contrast: OMNIPAQUE IOHEXOL 300 MG/ML IV SOLN  Comparison: Two-view chest x-ray 04/19/2011.  CTA chest 03/22/2006.  Findings: Pulmonary arterial opacification is satisfactory.  No focal filling defects are present to suggest pulmonary arteries. The heart size is normal.  Coronary artery calcifications are again noted.  No significant pleural or pericardial effusion is present. There is no significant mediastinal or axillary adenopathy.  The lung windows demonstrate mild scattered ground-glass attenuation, likely reflecting atelectasis.  The bone windows are unremarkable.  IMPRESSION:  1.  No evidence of pulmonary embolus. 2.  Scattered ground-glass attenuation the lungs likely represents atelectasis.  Minimal edema is considered less likely. 3.  Borderline cardiomegaly and coronary artery calcifications are similar to the prior exam. 4.  Minimal degenerative changes within the thoracic spine.  Original Report Authenticated By: Jamesetta Orleans. MATTERN, M.D.   Medications: I have reviewed the patient's current medications.  Assessment/Plan: 61 y/o aam with history of chest pain coming with acute chest pain for rule out of MI.  Also with cough and hemoptysis.   1. Chest pain  May be secondary to long standing cough. He will need an outpatient stress test again, he has a cardiologist.  No MSK. Typical in nature.  EKG negative x2 Cardiac enzymes negative x 2  C/w asa.  Telemetry  Responded to nitro tab. -continue prn   2. Hemoptysis  Unknown etiology. But PE, lung mass, TB consolidation ruled out with CT chest. F/u with Dr. Mauricio Po  3. HTN  Will  resume home medications today  4. Cocaine and Marijuana use  Long-standing - advised to quit  He denied cocaine use initially  5. FEN-GI  Regular diet,  lovenox  protonix   6. Dispo:  Pending last Troponin. Home today with follow up at [facility]   LOS: 1 day   Kamaryn Grimley 04/20/2011, 7:35 AM

## 2011-04-20 NOTE — Progress Notes (Signed)
Family Medicine Teaching Service Attending Note  I discussed patient Adam Burton  with Dr. Rivka Safer and reviewed their note for today.  I agree with their assessment and plan.

## 2011-04-20 NOTE — Discharge Summary (Signed)
Physician Discharge Summary  Patient ID: Adam Burton MRN: [id number] DOB/AGE: 1950-07-04 61 y.o.  Admit date: 04/19/2011 Discharge date: 04/20/2011  Admission Diagnoses: Chest pain  Discharge Diagnoses:  Chest pain Cocaine and Marijuana abuse  Discharged Condition: improved and stable  Hospital Course:  61 y/o aam admitted from family medicine clinic by Dr. Mauricio Po 2nd to chest pain for three days. Patient also complained of cough and hemoptysis. He was found to have a normal ekg in the clinic. Because of his multiple risk factors including, age, htn, hld, smoking he was admitted for a 23 hour rule out of cardiac source of chest pain. He was admitted with normal vital signs. His troponins were negative x 3. His repeat ekg was normal. We obtained a CT of the chest with contrast because he had a relative probability of TB vs. Lung Ca as cause of his chest pain.  Patient was found to have Cocaine on his UDS - he was advised at length not to use cocaine. He was also positive for marijuana.  He was discharged home the next morning in stable, improved condition.   Consults: None  Significant Diagnostic Studies: CT chest angiography: negative for PE, pneumonia, mass  Discharge Exam: Blood pressure 94/53, pulse 76, temperature 99.3 F (37.4 C), temperature source Oral, resp. rate 18, height 5\' 11"  (1.803 m), weight 161 lb 13.1 oz (73.4 kg), SpO2 99.00%. General: aam, nad, normal communication. aox3  Lungs: Normal respiratory effort, chest expands symmetrically. Lungs are clear to auscultation, no crackles or wheezes.  Heart - Regular rate and rhythm. No murmurs, gallops or rubs.  Extremities: No cyanosis, edema, or deformity noted   Disposition: Home with self care.    Medication List  As of 04/20/2011  9:01 AM   TAKE these medications         aspirin 81 MG EC tablet   Take 1 tablet (81 mg total) by mouth daily.      hydrochlorothiazide 25 MG tablet   Commonly known as: HYDRODIURIL     Take 1 tablet (25 mg total) by mouth daily.      lisinopril-hydrochlorothiazide 20-25 MG per tablet   Commonly known as: PRINZIDE,ZESTORETIC   Take 1 tablet by mouth daily.      pravastatin 20 MG tablet   Commonly known as: PRAVACHOL   Take 1 tablet (20 mg total) by mouth daily.      terazosin 1 MG capsule   Commonly known as: HYTRIN   Take 1 capsule (1 mg total) by mouth at bedtime.           Follow-up Information    Follow up with Barbaraann Barthel, MD. Schedule an appointment as soon as possible for a visit in 3 days.   Contact information:   [address] [place] [address] [phone]          SignedEdd Arbour MD 04/20/2011, 9:01 AM

## 2011-04-24 ENCOUNTER — Telehealth: Payer: Self-pay | Admitting: Family Medicine

## 2011-04-24 NOTE — Telephone Encounter (Signed)
Pt got out of hosp on Saturday and is feeling kind of bad today.  Constipated and "raw" feeling in groin area

## 2011-04-24 NOTE — Telephone Encounter (Signed)
Patient took some dulcolax 2 days ago and had BM.  Did strain at beginning of BM.  Denies any external hemorrhoids or rectal bleeding.  Feels rectal pressure when he sits.  Offered appt for tomorrow, but patient prefers to see Dr. Mauricio Po.  Appt given for 04/26/11 @ 11:30 am.  Patient informed he can use OTC hemorrhoid ointment, increase fluids & fiber intake, and can continue to use dulcolax.  Will call back tomorrow if he is unable to wait until Friday.  Will also route to Dr. Mauricio Po for any additional advice.   Gaylene Brooks, RN

## 2011-04-24 NOTE — Telephone Encounter (Signed)
I agree with triage plan.  WIll plan to see patient on Friday, March 1st.  Paula Compton, M.D.

## 2011-04-26 ENCOUNTER — Ambulatory Visit: Payer: No Typology Code available for payment source | Admitting: Family Medicine

## 2011-05-28 ENCOUNTER — Other Ambulatory Visit: Payer: Self-pay | Admitting: Family Medicine

## 2011-05-28 ENCOUNTER — Telehealth: Payer: Self-pay | Admitting: *Deleted

## 2011-05-28 DIAGNOSIS — R351 Nocturia: Secondary | ICD-10-CM

## 2011-05-28 NOTE — Telephone Encounter (Signed)
Left message for patient to call me in regards to a refill request received from [facility] for Proscar 5 mg.  It is not on his medication list.  Just wondering if this is prescribed by his Urologist.  Ileana Ladd

## 2011-05-28 NOTE — Telephone Encounter (Signed)
RN Team I do not see that he has ever BEEN on finasteride (proscar)---he IS on Hytrin. Please call and see if maybe a uriologist or someone is rx this--this is Dr Marinell Blight patient so I do not know him well--he MAY be on both but I cannot find that in chart THANKS! Denny Levy

## 2011-05-29 NOTE — Telephone Encounter (Signed)
Spoke with [name].  He states Dr. Mauricio Po prescribed his Proscar 5 mg for his prostate.  Would like to have this refilled.  I authorized a 30 day supply with no refills and will forward message to Dr. Mauricio Po  to add this medication to his med list if he wants to continue refilling this for Mr. Adam Burton.

## 2011-05-29 NOTE — Telephone Encounter (Signed)
Received refill request for Proscar 5 mg.   Not on medication list.   Saw in Dr. Marinell Blight office note from 03/29/11  that patient didn't fill like this medication was working so he stopped it. Spoke with patient. He states Dr. Mauricio Po had called this medication in for him before he went into the hospital.  Patient would like a refill.  I sent in for #30 with no refills until Dr. Mauricio Po gets back from vacation and can advise if he wants to continuing refilling this for Mr. Ladona Ridgel.   Ileana Ladd

## 2011-06-03 MED ORDER — FINASTERIDE 5 MG PO TABS: 5.0000 mg | ORAL_TABLET | Freq: Every day | ORAL | Status: DC

## 2011-06-03 NOTE — Telephone Encounter (Signed)
Please tell patient to stop hytrin and continue finesteride.  I sent in more refills for this.  Thanks, JB

## 2011-07-19 ENCOUNTER — Encounter: Payer: Self-pay | Admitting: *Deleted

## 2011-09-03 ENCOUNTER — Encounter: Payer: Self-pay | Admitting: Family Medicine

## 2011-09-03 ENCOUNTER — Ambulatory Visit (INDEPENDENT_AMBULATORY_CARE_PROVIDER_SITE_OTHER): Payer: Self-pay | Admitting: Family Medicine

## 2011-09-03 VITALS — BP 122/82 | HR 61 | Ht 71.0 in | Wt 165.6 lb

## 2011-09-03 DIAGNOSIS — E785 Hyperlipidemia, unspecified: Secondary | ICD-10-CM

## 2011-09-03 DIAGNOSIS — N529 Male erectile dysfunction, unspecified: Secondary | ICD-10-CM

## 2011-09-03 DIAGNOSIS — R079 Chest pain, unspecified: Secondary | ICD-10-CM

## 2011-09-03 DIAGNOSIS — F1910 Other psychoactive substance abuse, uncomplicated: Secondary | ICD-10-CM

## 2011-09-03 DIAGNOSIS — R7301 Impaired fasting glucose: Secondary | ICD-10-CM

## 2011-09-03 LAB — TSH: TSH: 0.529 u[IU]/mL (ref 0.350–4.500)

## 2011-09-03 LAB — CBC
HCT: 39.2 % (ref 39.0–52.0)
Hemoglobin: 13.7 g/dL (ref 13.0–17.0)
MCH: 31.1 pg (ref 26.0–34.0)
MCHC: 34.9 g/dL (ref 30.0–36.0)
MCV: 89.1 fL (ref 78.0–100.0)
Platelets: 316 10*3/uL (ref 150–400)
RBC: 4.4 MIL/uL (ref 4.22–5.81)
RDW: 13.9 % (ref 11.5–15.5)
WBC: 5.3 10*3/uL (ref 4.0–10.5)

## 2011-09-03 LAB — COMPREHENSIVE METABOLIC PANEL
ALT: 15 U/L (ref 0–53)
AST: 18 U/L (ref 0–37)
Albumin: 4 g/dL (ref 3.5–5.2)
Alkaline Phosphatase: 45 U/L (ref 39–117)
BUN: 12 mg/dL (ref 6–23)
CO2: 27 mEq/L (ref 19–32)
Calcium: 9.5 mg/dL (ref 8.4–10.5)
Chloride: 104 mEq/L (ref 96–112)
Creat: 1.07 mg/dL (ref 0.50–1.35)
Glucose, Bld: 78 mg/dL (ref 70–99)
Potassium: 5 mEq/L (ref 3.5–5.3)
Sodium: 139 mEq/L (ref 135–145)
Total Bilirubin: 0.7 mg/dL (ref 0.3–1.2)
Total Protein: 6.8 g/dL (ref 6.0–8.3)

## 2011-09-03 LAB — POCT GLYCOSYLATED HEMOGLOBIN (HGB A1C): Hemoglobin A1C: 6.2

## 2011-09-03 LAB — LDL CHOLESTEROL, DIRECT: Direct LDL: 84 mg/dL

## 2011-09-03 MED ORDER — RANITIDINE HCL 150 MG PO TABS: 150.0000 mg | ORAL_TABLET | Freq: Two times a day (BID) | ORAL | Status: DC

## 2011-09-03 NOTE — Progress Notes (Signed)
  Subjective:    Patient ID: Adam Burton, male    DOB: December 05, 1950, 61 y.o.   MRN: [id number]  HPI  Here for a few issues:   1. Reports sharp chest pain in the L axilla, onset associated with drinking vodka, usually resolves within a few ("maybe 2") minutes.  Perhaps some nausea, but no vomiting or retching. No fevers or chills, no cough or shortness of breath. No radiation of pain to mandible or arm or back.  No abd pain noted.    2. Had episode of "blacking out' for "a couple seconds" recently when he went to [place] to visit friends.  He had not eaten all day before arriving. When he got there, friends offered him vodka, which he drank.  After 1 sip he "felt faint" and experienced LOC for a few seconds, then came to.  No sz activity described.  No post-ictal state noted. Has never happened before nor since.  3. Has problems with erections.  Can get a reasonable erection, then loses it while in sex act with wife.  Reports he does have spontaneous morning erections sometimes.  COncerned about "low T".  I see he has had glucose readings above 125, and an A1c of 6.1 in the past.   4. We discussed the UDS on his last admission that was positive for cocaine.  He admits to regular THC use, but adamant he never uses cocaine.  He reflected on that UDS result, and recalls going to a party 3 days before his admission, during which he was offered a joint that "made my tongue tingle", thinks it was laced with cocaine.    Reports he has done stool cards for colon cancer screening; denies seeing blood or melena in stool.     Review of Systems no cough, no dyspnea.  No hemoptysis.  No dysuria, no fevers or chills.      Objective:   Physical Exam Alert, well appearing, no apparent distress HEENT Neck supple, no adenopathy or thyroid nodules COR REgular S1S2, no extra sounds PULM Clear bilat. No axillary adenopathy noted. NO reproducible pain with palpation sternum or chest wall ABD Soft, flat, no  megaly or masses          Assessment & Plan:

## 2011-09-03 NOTE — Patient Instructions (Addendum)
It was a pleasure to see you today.   I am checking labs and will contact you with the results.  I would like to see you back in the coming 2 to 4 weeks for follow-up on the pain. I ask that you stop all alcohol intake until I see you back.   If you continue to have pain, if it gets more severe or frequent, or other problems, call earlier than scheduled appointment.  FOLLOW UP 2- to 4 week with Dr Mauricio Po

## 2011-09-04 ENCOUNTER — Encounter: Payer: Self-pay | Admitting: Family Medicine

## 2011-09-04 DIAGNOSIS — F1910 Other psychoactive substance abuse, uncomplicated: Secondary | ICD-10-CM | POA: Insufficient documentation

## 2011-09-04 DIAGNOSIS — N529 Male erectile dysfunction, unspecified: Secondary | ICD-10-CM | POA: Insufficient documentation

## 2011-09-04 LAB — TESTOSTERONE, FREE, TOTAL, SHBG
Sex Hormone Binding: 48 nmol/L (ref 13–71)
Testosterone, Free: 76.6 pg/mL (ref 47.0–244.0)
Testosterone-% Free: 1.6 % (ref 1.6–2.9)
Testosterone: 470.31 ng/dL (ref 300–890)

## 2011-09-04 NOTE — Assessment & Plan Note (Signed)
Chest pain intermittent and associated with vodka intake.  Several qualities that go against a diagnosis of CAD, such as sharp characteristic, lasting for 2 minutes, no associated anginal symptoms.  He states he can abstain from alcohol, which is a trigger and raises concern for GI related cause of pain.

## 2011-09-04 NOTE — Assessment & Plan Note (Signed)
Patient readily admits to [facility] use regularly.  He had positive UDS, which he describes as being related to smoking a joint laced with cocaine. Denies cocaine use. Of concern is his description of vodka use.  By the medical history described, it is impacting his health (associated with chest pain; caused blackout). He expresses interest in stopping alcohol.

## 2011-11-29 ENCOUNTER — Ambulatory Visit: Payer: Self-pay | Admitting: Family Medicine

## 2012-04-20 ENCOUNTER — Encounter: Payer: Self-pay | Admitting: Cardiology

## 2012-08-21 ENCOUNTER — Encounter: Payer: Self-pay | Admitting: Family Medicine

## 2012-08-21 ENCOUNTER — Ambulatory Visit (INDEPENDENT_AMBULATORY_CARE_PROVIDER_SITE_OTHER): Payer: Self-pay | Admitting: Family Medicine

## 2012-08-21 VITALS — BP 120/70 | HR 67 | Temp 98.5°F | Ht 71.0 in | Wt 172.0 lb

## 2012-08-21 DIAGNOSIS — R7309 Other abnormal glucose: Secondary | ICD-10-CM

## 2012-08-21 DIAGNOSIS — N289 Disorder of kidney and ureter, unspecified: Secondary | ICD-10-CM | POA: Insufficient documentation

## 2012-08-21 DIAGNOSIS — R351 Nocturia: Secondary | ICD-10-CM

## 2012-08-21 DIAGNOSIS — Z1211 Encounter for screening for malignant neoplasm of colon: Secondary | ICD-10-CM

## 2012-08-21 DIAGNOSIS — N529 Male erectile dysfunction, unspecified: Secondary | ICD-10-CM

## 2012-08-21 DIAGNOSIS — Z125 Encounter for screening for malignant neoplasm of prostate: Secondary | ICD-10-CM

## 2012-08-21 HISTORY — DX: Disorder of kidney and ureter, unspecified: N28.9

## 2012-08-21 LAB — POCT URINALYSIS DIPSTICK
Bilirubin, UA: NEGATIVE
Glucose, UA: NEGATIVE
Ketones, UA: NEGATIVE
Leukocytes, UA: NEGATIVE
Nitrite, UA: NEGATIVE
Protein, UA: 30
Spec Grav, UA: 1.025
Urobilinogen, UA: 0.2
pH, UA: 7

## 2012-08-21 LAB — CBC
HCT: 41.4 % (ref 39.0–52.0)
Hemoglobin: 14.5 g/dL (ref 13.0–17.0)
MCH: 30.3 pg (ref 26.0–34.0)
MCHC: 35 g/dL (ref 30.0–36.0)
MCV: 86.4 fL (ref 78.0–100.0)
Platelets: 355 10*3/uL (ref 150–400)
RBC: 4.79 MIL/uL (ref 4.22–5.81)
RDW: 14.5 % (ref 11.5–15.5)
WBC: 5.3 10*3/uL (ref 4.0–10.5)

## 2012-08-21 LAB — COMPREHENSIVE METABOLIC PANEL
ALT: 12 U/L (ref 0–53)
AST: 13 U/L (ref 0–37)
Albumin: 4.3 g/dL (ref 3.5–5.2)
Alkaline Phosphatase: 54 U/L (ref 39–117)
BUN: 16 mg/dL (ref 6–23)
CO2: 27 mEq/L (ref 19–32)
Calcium: 9.4 mg/dL (ref 8.4–10.5)
Chloride: 103 mEq/L (ref 96–112)
Creat: 1.22 mg/dL (ref 0.50–1.35)
Glucose, Bld: 100 mg/dL — ABNORMAL HIGH (ref 70–99)
Potassium: 4.3 mEq/L (ref 3.5–5.3)
Sodium: 137 mEq/L (ref 135–145)
Total Bilirubin: 0.7 mg/dL (ref 0.3–1.2)
Total Protein: 7.4 g/dL (ref 6.0–8.3)

## 2012-08-21 LAB — PSA: PSA: 1.88 ng/mL (ref <=4.00)

## 2012-08-21 LAB — POCT UA - MICROSCOPIC ONLY

## 2012-08-21 LAB — POCT GLYCOSYLATED HEMOGLOBIN (HGB A1C): Hemoglobin A1C: 6.4

## 2012-08-21 MED ORDER — TERAZOSIN HCL 1 MG PO CAPS: 1.0000 mg | ORAL_CAPSULE | Freq: Every day | ORAL | Status: DC

## 2012-08-21 MED ORDER — FINASTERIDE 5 MG PO TABS: 5.0000 mg | ORAL_TABLET | Freq: Every day | ORAL | Status: DC

## 2012-08-21 NOTE — Progress Notes (Signed)
  Subjective:    Patient ID: Adam Burton, male    DOB: [birth year], 62 y.o.   MRN: [id number]  HPI Patient here for follow up, reports urinary frequency at night that is worse since he ran out of Proscar and Hytrin.  No dysuria.  Some slow stream.    Was living in [facility] for 8 months, had recurrence of chest pain and was admitted to [facility] from Feb 18-20, 2014; he has D/C summary for me to review, and I am submitting a copy for scanning into EMR. He had a myocardial perfusion study that was negative.  His discharge diagnosis is chest pain, likely GERD-related.  Since starting to take Papaya extract, he has had no further chest pain.  No dyspnea. No reflux sx or abdominal pain.  He does not smoke tobacco but does continue to smoke THC one or two times a week. Drinks a single alcoholic drink every other day on average.  Denies other drugs.  Has stopped red meat and lamb, eats mostly vegetables and fish.  Feels well.  Also of note at [facility], had A1C 6.7%, and serum Cr 1.28.  Family Hx; No prostate cancer in family.  Mother, sister, maternal GM and aunt all with DM. Review of SystemsSee HPI     Objective:   Physical Exam Well appearing, no apparent distress HEENT Neck supple. No cervical adenopathy COR Regular S1S2, no extra sounds PULM Clear bilaterally, no rales or wheezes ABD Soft, nontender, nondistended. Normal bowel sounds.  DRE Smooth, enlarged symmetric prostate, nontender.  LEs no edema noted in ankles.        Assessment & Plan:

## 2012-08-21 NOTE — Patient Instructions (Addendum)
It was a pleasure to see you again today.  I am ordering various tests today to look at the blood sugar, the kidney and liver function, and your prostate.  Also, stool cards to screen for colon cancer.   I will contact you [phone]) with results when they are back.  Refills of the Hytrin and Proscar medications at [facility].  I would like to see you back in 4 to 6 weeks.

## 2012-08-21 NOTE — Assessment & Plan Note (Signed)
Elevated Cr on hospital records from [facility] in Feb 2014.  For recheck today.  Consider intrinsic renal disease (possibly related to DM), versus post-renal causes. If confirmed on repeat metabolic panel today, then to consider Renal US to look for obstructive renal disease versus disparate renal size/findings of medical renal disease. He used to be on ACEI but is no longer taking.  To discuss after results of labs, at follow up in 1-2 months.

## 2012-08-21 NOTE — Assessment & Plan Note (Signed)
Discussed with him; he did not return stool cards in the past.  Agrees to return the cards this year.  Provided with stool cards and instructions on how to collect/return.

## 2012-08-21 NOTE — Assessment & Plan Note (Signed)
Discussed this; he does not take ED medications (and discussed why this is not advisable with his BPH treatment).

## 2012-08-24 ENCOUNTER — Encounter: Payer: Self-pay | Admitting: Family Medicine

## 2012-08-27 LAB — POC HEMOCCULT BLD/STL (HOME/3-CARD/SCREEN)

## 2012-08-27 NOTE — Addendum Note (Signed)
Addended by: [place], Ulyana Pitones on: 08/27/2012 10:10 AM   Modules accepted: Orders

## 2012-09-01 ENCOUNTER — Encounter: Payer: Self-pay | Admitting: Family Medicine

## 2012-11-17 IMAGING — CR DG CHEST 2V
2 series · 2 of 2 positions shown · non-contrast
Comparison: 11/28/2009

CLINICAL DATA: Cough and chest congestion.  Hemoptysis.  786.30.

CHEST - 2 VIEW

[view not recorded (1 of 2)]
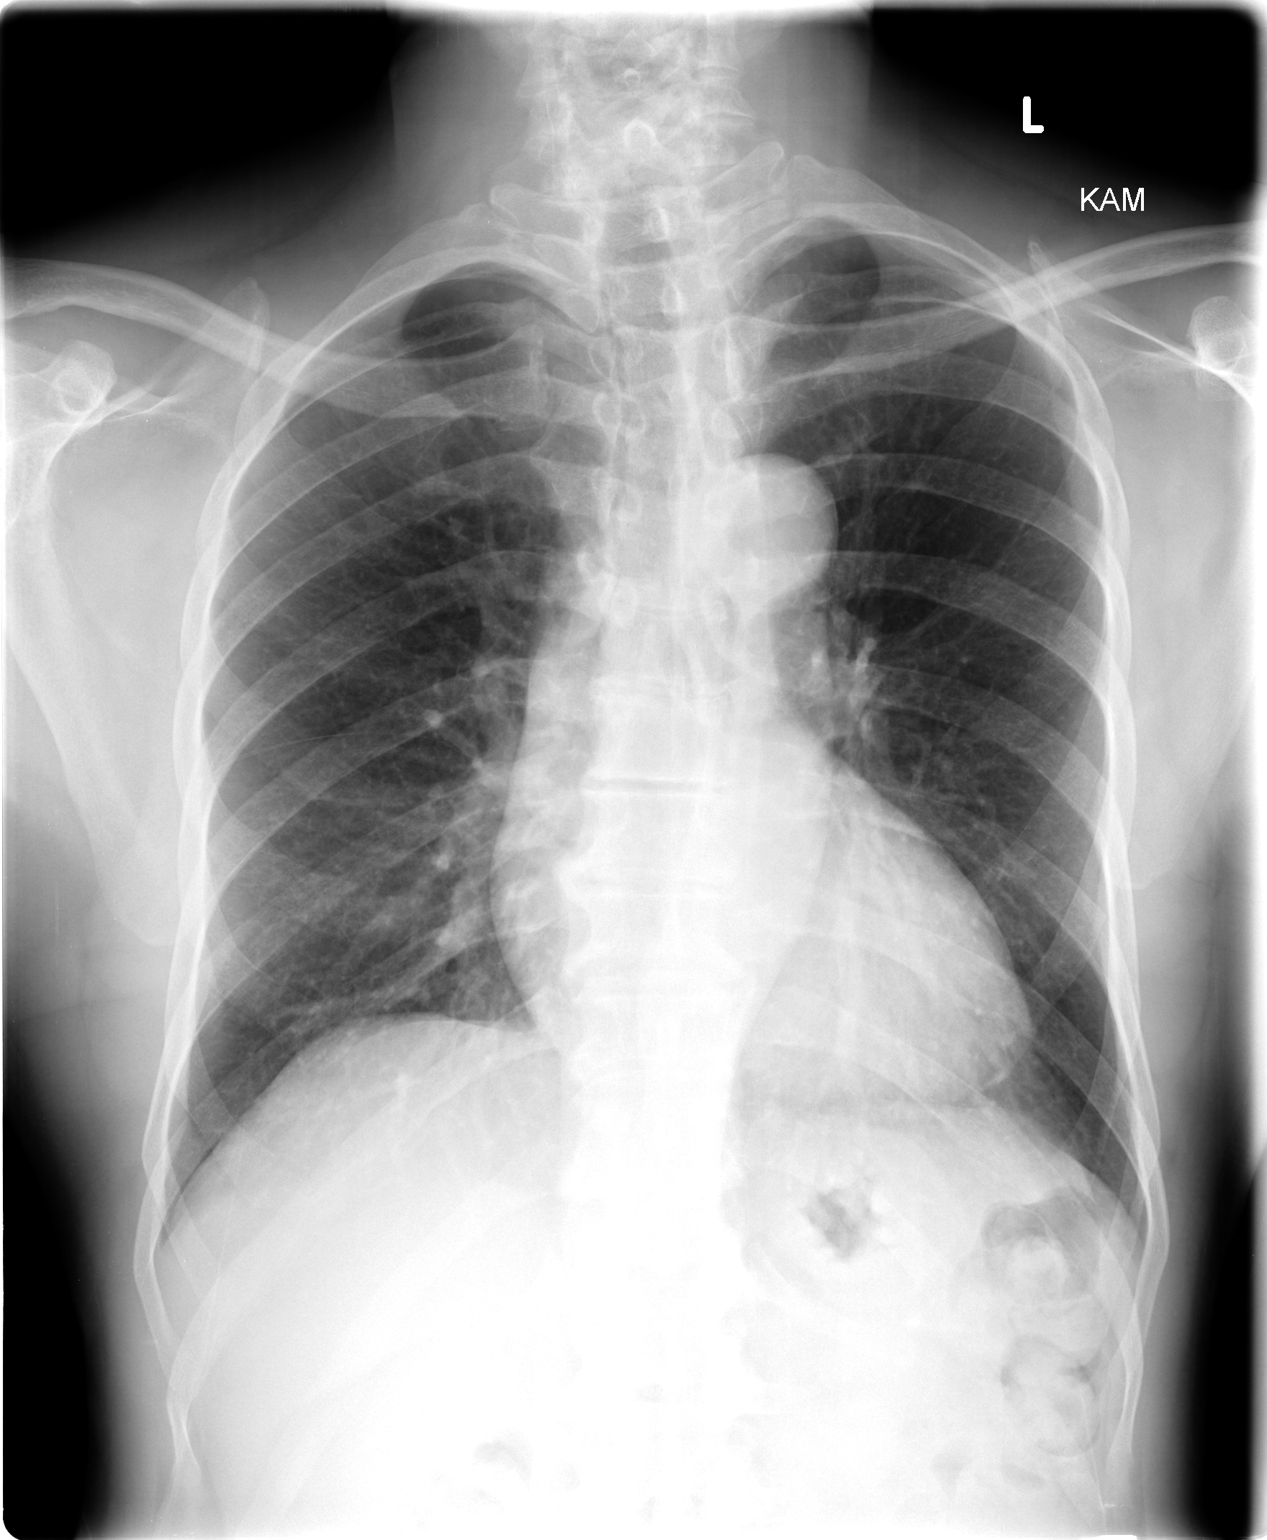

[view not recorded (2 of 2)]
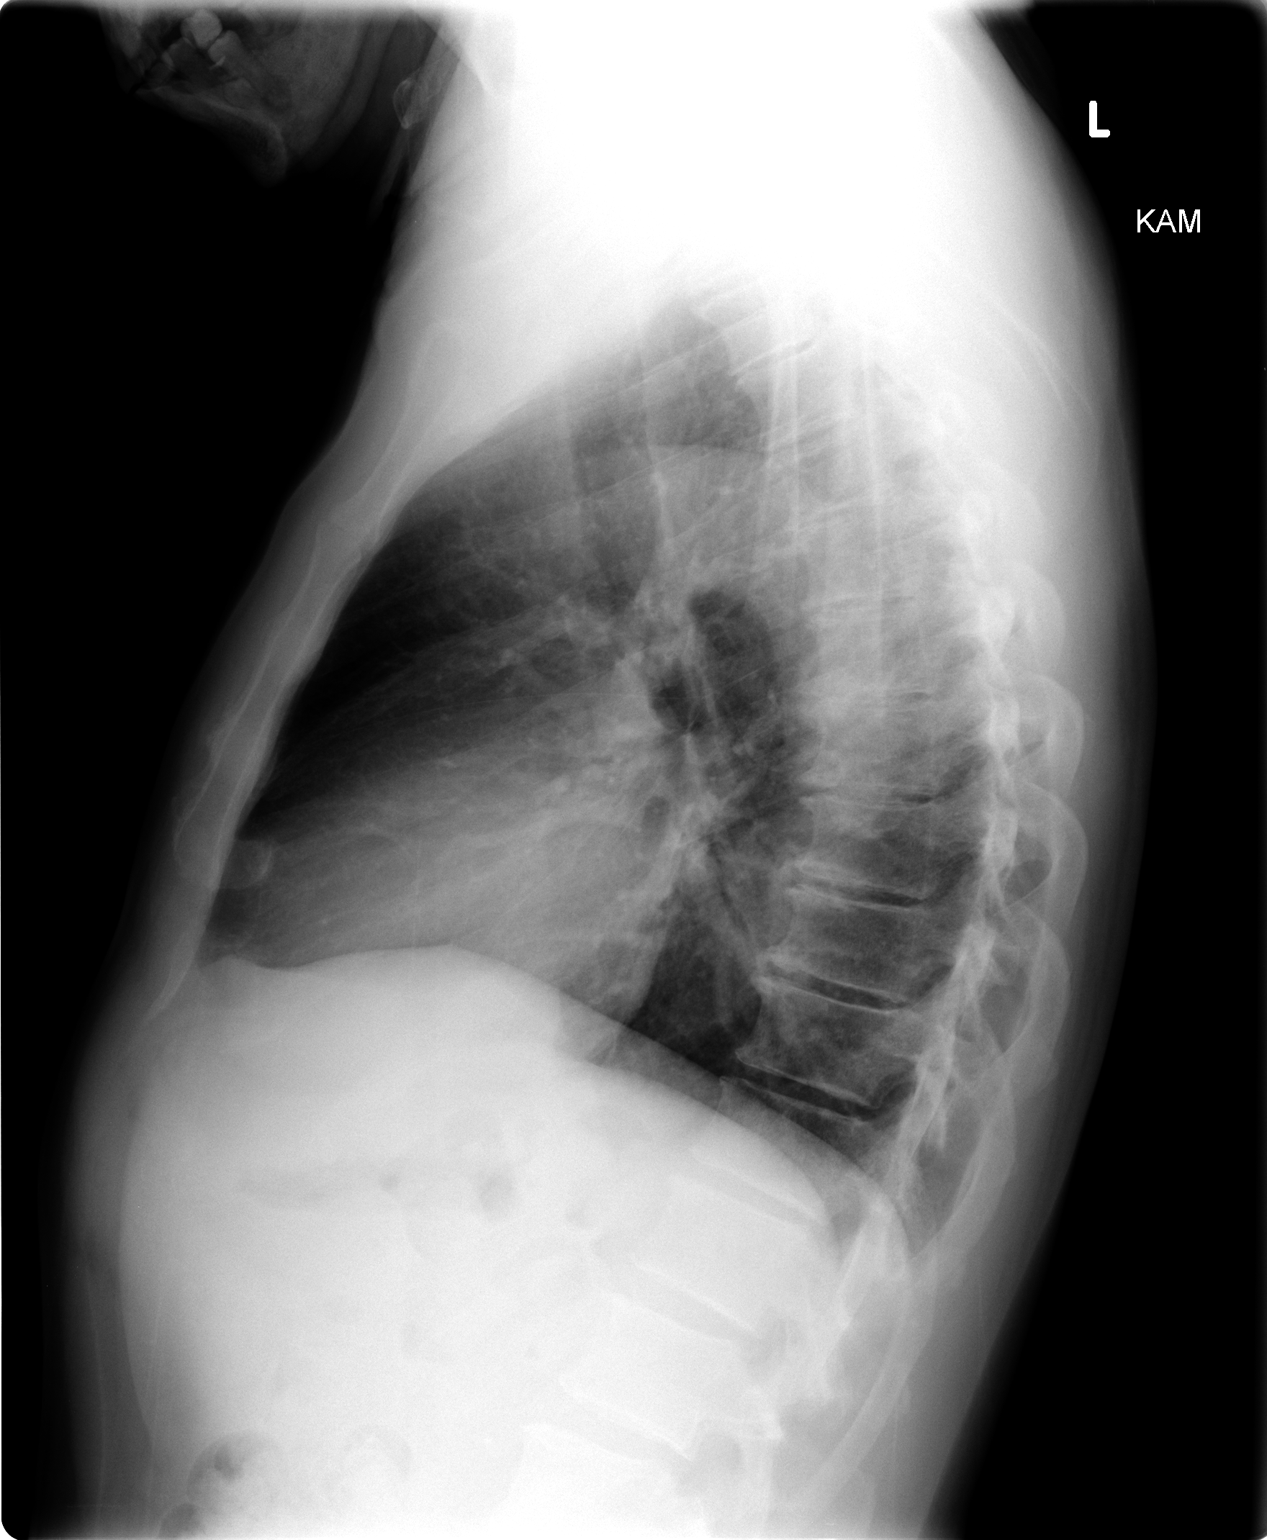

[2 of 2 positions shown; findings below may reference images not displayed]

FINDINGS: Heart size and vascularity are normal and the lungs are
clear.  No significant osseous abnormality.
IMPRESSION: No acute disease in the chest.

## 2013-10-19 ENCOUNTER — Inpatient Hospital Stay (HOSPITAL_COMMUNITY)
Admission: RE | Admit: 2013-10-19 | Discharge: 2013-10-19 | Disposition: A | Discharge status: Home / Self Care | Payer: No Typology Code available for payment source | Source: Ambulatory Visit

## 2013-10-19 ENCOUNTER — Ambulatory Visit (INDEPENDENT_AMBULATORY_CARE_PROVIDER_SITE_OTHER): Payer: Self-pay | Admitting: Family Medicine

## 2013-10-19 ENCOUNTER — Ambulatory Visit (HOSPITAL_COMMUNITY)
Admission: RE | Admit: 2013-10-19 | Discharge: 2013-10-19 | Disposition: A | Discharge status: Home / Self Care | Payer: No Typology Code available for payment source | Source: Ambulatory Visit | Attending: Family Medicine | Admitting: Family Medicine

## 2013-10-19 ENCOUNTER — Encounter: Payer: Self-pay | Admitting: Family Medicine

## 2013-10-19 VITALS — BP 136/84 | HR 74 | Temp 98.0°F | Ht 71.0 in | Wt 166.9 lb

## 2013-10-19 DIAGNOSIS — R072 Precordial pain: Secondary | ICD-10-CM

## 2013-10-19 DIAGNOSIS — I10 Essential (primary) hypertension: Secondary | ICD-10-CM

## 2013-10-19 DIAGNOSIS — E785 Hyperlipidemia, unspecified: Secondary | ICD-10-CM

## 2013-10-19 DIAGNOSIS — R351 Nocturia: Secondary | ICD-10-CM

## 2013-10-19 DIAGNOSIS — R079 Chest pain, unspecified: Secondary | ICD-10-CM

## 2013-10-19 DIAGNOSIS — Z125 Encounter for screening for malignant neoplasm of prostate: Secondary | ICD-10-CM

## 2013-10-19 DIAGNOSIS — R7301 Impaired fasting glucose: Secondary | ICD-10-CM | POA: Insufficient documentation

## 2013-10-19 LAB — POCT GLYCOSYLATED HEMOGLOBIN (HGB A1C): Hemoglobin A1C: 6.3

## 2013-10-19 LAB — CBC
HCT: 42.2 % (ref 39.0–52.0)
Hemoglobin: 14.5 g/dL (ref 13.0–17.0)
MCH: 30.2 pg (ref 26.0–34.0)
MCHC: 34.4 g/dL (ref 30.0–36.0)
MCV: 87.9 fL (ref 78.0–100.0)
Platelets: 340 10*3/uL (ref 150–400)
RBC: 4.8 MIL/uL (ref 4.22–5.81)
RDW: 13.8 % (ref 11.5–15.5)
WBC: 4.4 10*3/uL (ref 4.0–10.5)

## 2013-10-19 MED ORDER — FINASTERIDE 5 MG PO TABS: 5.0000 mg | ORAL_TABLET | Freq: Every day | ORAL | Status: DC

## 2013-10-19 MED ORDER — LISINOPRIL-HYDROCHLOROTHIAZIDE 20-25 MG PO TABS: 1.0000 | ORAL_TABLET | Freq: Every day | ORAL | Status: DC

## 2013-10-19 MED ORDER — NITROGLYCERIN 0.4 MG SL SUBL: 0.4000 mg | SUBLINGUAL_TABLET | SUBLINGUAL | Status: DC | PRN

## 2013-10-19 MED ORDER — PRAVASTATIN SODIUM 20 MG PO TABS: 20.0000 mg | ORAL_TABLET | Freq: Every day | ORAL | Status: DC

## 2013-10-19 NOTE — Assessment & Plan Note (Addendum)
Patient with L sided chest pain that goes to L axilla; lasts for hours to days and is described as "lancing".  ECG today. Prior Myoview Nov 2010 with inferoseptal wall ischemia.  Risk factors smoking (THC), HTN, DM.  No associated dyspnea, nausea/vomiting, or feeling of doom.   ECG today. He is not having pain at the present time.  ASA 81mg  daily. He will be Medicare-eligible in 2 months; applying for [employer]. Labs and follow up in 2 weeks with me.Patient is to present to ED or call 911 if recurrent episode of chest pain; given prescription for sublingual NTG to use in event he has recurrence of chest pain.

## 2013-10-19 NOTE — Patient Instructions (Signed)
It was a pleasure to see you today.    I am concerned about your chest pain. If you have another episode of chest pain, please take a nitroglycerin under the tongue to see if the pain stops.  If not, place a second tablet under your tongue five minutes after the first.  If no resolution, call 911.   Baby aspirin 81mg  EVERY DAY, even when you are not having pain.   I am restarting your medicines at [facility]; HCTZ/lisinopril one tablet daily; Proscar (prostate) one daily; Pravachol (cholesterol) one tablet every night.    I ask that you apply for the [place] so that we may begin an outpatient workup of your chest pain.    FOLLOW UP WITH DR Lindell Noe IN 2 to 3 WEEKS>

## 2013-10-20 LAB — COMPREHENSIVE METABOLIC PANEL
ALT: 13 U/L (ref 0–53)
AST: 15 U/L (ref 0–37)
Albumin: 4.5 g/dL (ref 3.5–5.2)
Alkaline Phosphatase: 56 U/L (ref 39–117)
BUN: 14 mg/dL (ref 6–23)
CO2: 29 mEq/L (ref 19–32)
Calcium: 9.6 mg/dL (ref 8.4–10.5)
Chloride: 103 mEq/L (ref 96–112)
Creat: 1.16 mg/dL (ref 0.50–1.35)
Glucose, Bld: 93 mg/dL (ref 70–99)
Potassium: 4.3 mEq/L (ref 3.5–5.3)
Sodium: 139 mEq/L (ref 135–145)
Total Bilirubin: 0.8 mg/dL (ref 0.2–1.2)
Total Protein: 7.5 g/dL (ref 6.0–8.3)

## 2013-10-20 LAB — LIPID PANEL
Cholesterol: 184 mg/dL (ref 0–200)
HDL: 56 mg/dL (ref >39)
LDL Cholesterol: 114 mg/dL — ABNORMAL HIGH (ref 0–99)
Total CHOL/HDL Ratio: 3.3 Ratio
Triglycerides: 70 mg/dL (ref <150)
VLDL: 14 mg/dL (ref 0–40)

## 2013-10-20 LAB — PSA: PSA: 1.92 ng/mL (ref <=4.00)

## 2013-10-20 NOTE — Assessment & Plan Note (Signed)
For repeat of N8T and metabolic panel today. He had fruit for breakfast prior to the visit today.

## 2013-10-20 NOTE — Assessment & Plan Note (Signed)
Patient's antihypertensive meds refilled; to continue with baby aspirin. Recheck at follow up in short interval.

## 2013-10-20 NOTE — Progress Notes (Signed)
   Subjective:    Patient ID: Adam Burton, male    DOB: 1950-04-20, 63 y.o.   MRN: [id number]  HPI Patient here for follow up of several problems:  1. Hypertension.  Ran out of antihypertensive meds around 1 month ago.  Is taking baby aspirin, but not his BP meds nor statin.  2. Blood glucose, for A1C check.   Does not check sugars at home.  3. Prostate check: He has 2 episodes of nocturia per night. Describes weakened urinary stream. Denies blood or dysuria or polyuria. No family history of prostate cancer. Has had PSA checks for prostate cancer screening in the past, unremarkable. Asks for DRE to check prostate.  4. Reports intermittent chest pain. None at the time of this visit. Describes event 3 weeks prior to visit when he was at a party and had a couple of beers, felt lightheaded and "next thing I know I'm on the ground". Reports LOC for seconds. Witnessed by multiple partygoers, no reported seizure activity. No loss of urine or fecal continence. When he stood up, had sharp "lancing" chest pain along left sternal border radiating to L axilla.  Lasted until the next day. Took aspirin for the pain and went away. Has had similar but less intense episodes of similar pain, worse when laying on his L side in bed. "can't find a comfortable position".  No trauma to chest wall.  Chest pain not accompanied by dyspnea, nausea/vomiting, diaphoresis, or feelings of dread. Not associated with diet/meals; not associated with exertion.  PMHx: patient with prior episodes of chest pain; old records reveal Myoview done in November 2010 showing "basal inferoseptal wall ischemia".   Social Hx; Patient drinks 2 shots of vodka every 2 days; smokes THC a few times a week, reduced from previous amounts. No other drugs. Does not smoke tobacco.  Review of Systems Denies fevers or chills; see above for rest of ROS.    Objective:   Physical Exam Well appearing, no apparent distress.  HEENT Neck supple, no cervical  adenopathy.  COR regular S1S2, no extra sounds. Reproducible pain with palpation along L chest and L axilla/ tail of the pectoralis major (L). Full active ROM of arms bilat, including internal and external rotation.  PULM Clear bilaterally, no rales or wheezes.  ABD Soft, nontender, nondistended. Audible bowel sounds bilaterally.        Assessment & Plan:

## 2013-10-21 ENCOUNTER — Encounter: Payer: Self-pay | Admitting: Family Medicine

## 2013-11-02 ENCOUNTER — Ambulatory Visit (INDEPENDENT_AMBULATORY_CARE_PROVIDER_SITE_OTHER): Payer: Self-pay | Admitting: Family Medicine

## 2013-11-02 ENCOUNTER — Encounter: Payer: Self-pay | Admitting: Family Medicine

## 2013-11-02 VITALS — BP 130/84 | HR 73 | Temp 98.4°F | Ht 71.0 in | Wt 170.8 lb

## 2013-11-02 DIAGNOSIS — R079 Chest pain, unspecified: Secondary | ICD-10-CM

## 2013-11-02 DIAGNOSIS — I209 Angina pectoris, unspecified: Secondary | ICD-10-CM

## 2013-11-02 DIAGNOSIS — E785 Hyperlipidemia, unspecified: Secondary | ICD-10-CM

## 2013-11-02 DIAGNOSIS — R072 Precordial pain: Secondary | ICD-10-CM

## 2013-11-02 DIAGNOSIS — IMO0002 Reserved for concepts with insufficient information to code with codable children: Secondary | ICD-10-CM

## 2013-11-02 MED ORDER — PRAVASTATIN SODIUM 40 MG PO TABS: 40.0000 mg | ORAL_TABLET | Freq: Every day | ORAL | Status: DC

## 2013-11-02 MED ORDER — NITROGLYCERIN 0.4 MG SL SUBL: 0.4000 mg | SUBLINGUAL_TABLET | SUBLINGUAL | Status: DC | PRN

## 2013-11-02 MED ORDER — TERAZOSIN HCL 1 MG PO CAPS: 1.0000 mg | ORAL_CAPSULE | Freq: Every day | ORAL | Status: DC

## 2013-11-02 NOTE — Assessment & Plan Note (Signed)
Well controlled, reviewed recent labs. Continue ACEI/HCTZ and ASA daily.

## 2013-11-02 NOTE — Progress Notes (Signed)
   Subjective:    Patient ID: Adam Burton, male    DOB: 1950-09-13, 63 y.o.   MRN: [id number]  HPI Patient here for follow up; he has continued to have almost daily chest tightness that occurs only after eating. Not associated with exertion.  No associated dyspnea, nausea or diaphoresis, lasts about 5-6 minutes before resolving. Did not receive the NTG that was prescribed at last visit. He continues to take lisinopril/HCTZ daily, as well as pravastatin 20mg  and asa 81mg  daily.   Reports that he is out of terazosin and notices his urinary stream slower when not taking. Prostate exam last visit was smooth and non-nodular, nontender.   ROS: See HPI.    Review of Systems     Objective:   Physical Exam Well appearing, no apparent distress HEENT neck supple, no cervical adenopathy COR Regular S1S2, no extra sounds PULM Clear lung fields bilaterally, no rales or wheezes ABD Soft, nontender, nondistended. No organomegaly, no rebound.        Assessment & Plan:

## 2013-11-02 NOTE — Assessment & Plan Note (Signed)
Patient's pravastatin is increased from 20->40mg  daily, as he has indications for high-dose statin therapy but cost prohibitive at this time. He voices agreement with this change.

## 2013-11-02 NOTE — Assessment & Plan Note (Signed)
Patient with chest pressure that is associated with eating, not associated with GERD-like symptoms. Given known CAD from prior Myoview, plan to refer for outpatient Cardiology evaluation.  He is not having chest pressure/discomfort now. To continue ASA, statin, and ACEI/HCTZ for now. BP is well controlled. Discussed use of sl NTG if recurrent chest pain, as well as when to call 911 for emergent evaluation. Follow up here in 4-6 weeks or sooner as needed.

## 2013-11-02 NOTE — Patient Instructions (Signed)
It was a pleasure to see you today.  I am glad you are back on your medicines as we disussed.   Terazosin (refilled)--- for the prostate Finasteride-- prostate  Lisinopril/HCTZ-- blood pressure  Changed dose of Pravastatin from 20mg  to 40mg , one time in the evening-- for cholesterol.  Keep taking the Aspirin 81mg  one time daily, EVERY DAY.  I sent in sublingual nitroglycerin, to use if you are having the chest pain/tightness.  If not better, or if severe, call 911.  FOLLOW UP IN 4 TO 6 WEEKS WITH DR Lindell Noe.

## 2013-11-08 ENCOUNTER — Ambulatory Visit: Payer: No Typology Code available for payment source

## 2013-11-25 HISTORY — PX: TRANSTHORACIC ECHOCARDIOGRAM: SHX275

## 2013-11-26 ENCOUNTER — Encounter: Payer: Self-pay | Admitting: Family Medicine

## 2013-11-26 ENCOUNTER — Ambulatory Visit (INDEPENDENT_AMBULATORY_CARE_PROVIDER_SITE_OTHER): Payer: Self-pay | Admitting: Family Medicine

## 2013-11-26 VITALS — BP 138/80 | HR 60 | Temp 98.3°F | Ht 71.0 in | Wt 174.1 lb

## 2013-11-26 DIAGNOSIS — R072 Precordial pain: Secondary | ICD-10-CM

## 2013-11-26 DIAGNOSIS — I10 Essential (primary) hypertension: Secondary | ICD-10-CM

## 2013-11-26 DIAGNOSIS — R29898 Other symptoms and signs involving the musculoskeletal system: Secondary | ICD-10-CM

## 2013-11-26 DIAGNOSIS — R079 Chest pain, unspecified: Secondary | ICD-10-CM

## 2013-11-26 LAB — BASIC METABOLIC PANEL
BUN: 15 mg/dL (ref 6–23)
CO2: 26 mEq/L (ref 19–32)
Calcium: 9.4 mg/dL (ref 8.4–10.5)
Chloride: 102 mEq/L (ref 96–112)
Creat: 1.11 mg/dL (ref 0.50–1.35)
Glucose, Bld: 101 mg/dL — ABNORMAL HIGH (ref 70–99)
Potassium: 4.7 mEq/L (ref 3.5–5.3)
Sodium: 137 mEq/L (ref 135–145)

## 2013-11-26 MED ORDER — LISINOPRIL-HYDROCHLOROTHIAZIDE 20-25 MG PO TABS: 1.0000 | ORAL_TABLET | Freq: Every day | ORAL | Status: DC

## 2013-11-26 MED ORDER — ATORVASTATIN CALCIUM 20 MG PO TABS: 20.0000 mg | ORAL_TABLET | Freq: Every day | ORAL | Status: DC

## 2013-11-26 MED ORDER — NITROGLYCERIN 0.4 MG SL SUBL: 0.4000 mg | SUBLINGUAL_TABLET | SUBLINGUAL | Status: DC | PRN

## 2013-11-26 NOTE — Assessment & Plan Note (Signed)
Patient has not been taking medications due to cost. We spent a considerable amount of time navigating the www.GoodRx.com website and choosing options that are cheapest for him. Rx and coupons printed with explicit instructions to ensure compliance with meds.  Keep Cardiology appt on Dec 21, 2013. Baby aspirin daily.

## 2013-11-26 NOTE — Patient Instructions (Signed)
It was a pleasure to see you today.   Because of your left arm weakness, I am concerned that you may have had a stroke about a month ago.  For this reason, I am ordering a CT scan of the brain and an Echocardiogram.  We will draw some blood today in order to be able to give you contrast in the vein for the CT.    IF YOU HAVE NEW ONSET WEAKNESS, PROBLEMS SPEAKING/SWALLOWING, OR VISION CHANGES, IT IS CRITICAL TO CALL 9-1-1 IMMEDIATELY AND REPORT THESE SYMPTOMS/CONCERN FOR POSSIBLE STROKE.   I printed new prescriptions as well as coupons for them at [facility] (atorvastatin and lisinopril/HCTZ) and [facility] (Nitroglycerin in case of chest pain).  KEEP TAKING THE BABY ASPIRIN 81mg  ONE TIME DAILY.  FOLLOW UP WITH DR Lindell Noe IN 2 to 3 WEEKS

## 2013-11-26 NOTE — Progress Notes (Signed)
   Subjective:    Patient ID: Adam Burton, male    DOB: [birth year], 63 y.o.   MRN: [id number]  HPI Patient with abnormal Myoview history, here for follow up of recent episodes of chest pain.  He has continued to have brief (3-5 minutes) episodes of chest pain, sometimes associated with eating, or after he has a beer.  The episodes that are associated with eating are also accompanied by diaphoresis. He has an appointment with Cardiology on Oct 27th.  He does not have chest pain today.  His last episode was yesterday, lasted 5 minutes.   He has not been able to pick up medicines due to cost concerns.  Not using sublingual NTG (doesn't have). Reports that he is taking the ASA 81mg  every day. No tobacco smoking, but smokes THC regularly.   In ROS, mentions onset of L arm weakness approximately one month ago.  Finds it difficult to lift heavy objects due to weakness. Does not recall a discrete event that is associated with the onset of the weakness. No history of dysarthria, headaches, visual changes.  No prior TIA/CVA history that he knows of.   Review of Systems Regarding Chest Pain, dyspnea, and weakness, see above.      Objective:   Physical Exam Well appearing, no apparent distress. Speaking fluidly without dysarthria.  HEENT: Neck supple, no adenopathy. Tongue midline. EOMI. Shoulder shrug full and symmetric.  NEURO: Hand grip 4/5 (L), 5/5 (R).  Upper extremity flexion/extension 3/5 (left), 5/5 (right). Brachioradialis and biceps reflexes 1+ symmetric.  Lower extremities full strength and symmetric. Gait unremarkable.  COR regular S1S2, no extra sounds PULM Clear bilaterally, no rales or wheezes ABD Soft, nontender, nondistended. Audible bowel sounds throughout. No masses or megaly.        Assessment & Plan:

## 2013-11-26 NOTE — Assessment & Plan Note (Signed)
L upper ext weakness developing 1 month ago (this note 11/26/13).  Concern for CVA. CT head and ECHO as outpatient. DISCUSSED WITH PATIENT WARNING SIGNS FOR ACUTE STROKE, NEED FOR CALLING 9-1-1 IMMEDIATELY UPON RECOGNIZING ANY OF THESE SXS.

## 2013-11-26 NOTE — Assessment & Plan Note (Signed)
Discussed indications for 9-1-1 versus use of sublingual NTG>  Patient's association of the chest pain with eating or beer raises likelihood of GI cause; however, has abnormal prior Myoview and numerous risk factors to warrant Cardiology evaluation.

## 2013-11-29 ENCOUNTER — Telehealth: Payer: Self-pay | Admitting: Family Medicine

## 2013-11-29 ENCOUNTER — Ambulatory Visit (HOSPITAL_COMMUNITY)
Admission: RE | Admit: 2013-11-29 | Discharge: 2013-11-29 | Disposition: A | Discharge status: Home / Self Care | Payer: Self-pay | Source: Ambulatory Visit | Attending: Family Medicine | Admitting: Family Medicine

## 2013-11-29 ENCOUNTER — Other Ambulatory Visit: Payer: Self-pay | Admitting: Family Medicine

## 2013-11-29 DIAGNOSIS — M6281 Muscle weakness (generalized): Secondary | ICD-10-CM | POA: Insufficient documentation

## 2013-11-29 DIAGNOSIS — I129 Hypertensive chronic kidney disease with stage 1 through stage 4 chronic kidney disease, or unspecified chronic kidney disease: Secondary | ICD-10-CM | POA: Insufficient documentation

## 2013-11-29 DIAGNOSIS — R29898 Other symptoms and signs involving the musculoskeletal system: Secondary | ICD-10-CM

## 2013-11-29 DIAGNOSIS — N189 Chronic kidney disease, unspecified: Secondary | ICD-10-CM | POA: Insufficient documentation

## 2013-11-29 NOTE — Telephone Encounter (Signed)
Called patient to discuss findings of CT brain. He continues with left upper extremity weakness when lifting things.  Has had recurrence of chest pain since last visit, none now.  Has Cardiology follow up as well.  Plan to follow with MRI brain w/wo contrast to evaluate for possible CVA.  Dalbert Mayotte, MD

## 2013-11-30 ENCOUNTER — Telehealth: Payer: Self-pay | Admitting: Family Medicine

## 2013-11-30 NOTE — Telephone Encounter (Signed)
Pt is claustrophobic and has been scheduled for MRI. He is wondering why this is being scheduled and what can be done

## 2013-11-30 NOTE — Telephone Encounter (Signed)
Spoke with patient states he is claustrophobic and will need something for anxiety prior to getting MRI. Will forward to PCP

## 2013-11-30 NOTE — Telephone Encounter (Signed)
MRI scheduled for 10/19 at 8am at [facility], patient informed.

## 2013-12-02 ENCOUNTER — Ambulatory Visit (HOSPITAL_COMMUNITY)
Admission: RE | Admit: 2013-12-02 | Discharge: 2013-12-02 | Disposition: A | Discharge status: Home / Self Care | Payer: No Typology Code available for payment source | Source: Ambulatory Visit | Attending: Family Medicine | Admitting: Family Medicine

## 2013-12-02 ENCOUNTER — Other Ambulatory Visit (HOSPITAL_COMMUNITY): Payer: Self-pay | Admitting: Family Medicine

## 2013-12-02 DIAGNOSIS — E785 Hyperlipidemia, unspecified: Secondary | ICD-10-CM | POA: Insufficient documentation

## 2013-12-02 DIAGNOSIS — I10 Essential (primary) hypertension: Secondary | ICD-10-CM | POA: Insufficient documentation

## 2013-12-02 DIAGNOSIS — I639 Cerebral infarction, unspecified: Secondary | ICD-10-CM

## 2013-12-02 DIAGNOSIS — R29898 Other symptoms and signs involving the musculoskeletal system: Secondary | ICD-10-CM

## 2013-12-02 DIAGNOSIS — R072 Precordial pain: Secondary | ICD-10-CM

## 2013-12-02 DIAGNOSIS — R079 Chest pain, unspecified: Secondary | ICD-10-CM | POA: Insufficient documentation

## 2013-12-02 MED ORDER — LORAZEPAM 0.5 MG PO TABS: ORAL_TABLET | ORAL | Status: DC

## 2013-12-02 NOTE — Telephone Encounter (Signed)
Prescription for lorazepam printed out for patient. Please ask him to which pharmacy he would like the prescription to be faxed. Dalbert Mayotte, MD

## 2013-12-02 NOTE — Telephone Encounter (Signed)
Patient informed that rx had been written he states he will pick it up.

## 2013-12-02 NOTE — Progress Notes (Signed)
Echocardiogram 2D Echocardiogram has been performed.  Urania Pearlman 12/02/2013, 1:55 PM

## 2013-12-03 ENCOUNTER — Telehealth: Payer: Self-pay | Admitting: Family Medicine

## 2013-12-03 NOTE — Telephone Encounter (Signed)
Called patient and reported results of ECHO.  Patient is scheduled for MRI brain at [facility] on 10/19 to arrive 0745am, to evaluate for possible stroke causing R upper extremity weakness over the past month. Also confirmed his appointment with Dr Johnsie Cancel from Cardiology later this month.  JB

## 2013-12-06 ENCOUNTER — Encounter (HOSPITAL_COMMUNITY): Payer: Self-pay | Admitting: Emergency Medicine

## 2013-12-06 ENCOUNTER — Emergency Department (HOSPITAL_COMMUNITY)
Admission: EM | Admit: 2013-12-06 | Discharge: 2013-12-06 | Disposition: A | Discharge status: Home / Self Care | Payer: No Typology Code available for payment source | Attending: Emergency Medicine | Admitting: Emergency Medicine

## 2013-12-06 DIAGNOSIS — Z79899 Other long term (current) drug therapy: Secondary | ICD-10-CM | POA: Insufficient documentation

## 2013-12-06 DIAGNOSIS — Z7982 Long term (current) use of aspirin: Secondary | ICD-10-CM | POA: Insufficient documentation

## 2013-12-06 DIAGNOSIS — I10 Essential (primary) hypertension: Secondary | ICD-10-CM | POA: Insufficient documentation

## 2013-12-06 DIAGNOSIS — E780 Pure hypercholesterolemia: Secondary | ICD-10-CM | POA: Insufficient documentation

## 2013-12-06 DIAGNOSIS — Z87448 Personal history of other diseases of urinary system: Secondary | ICD-10-CM | POA: Insufficient documentation

## 2013-12-06 DIAGNOSIS — R112 Nausea with vomiting, unspecified: Secondary | ICD-10-CM | POA: Insufficient documentation

## 2013-12-06 DIAGNOSIS — R197 Diarrhea, unspecified: Secondary | ICD-10-CM | POA: Insufficient documentation

## 2013-12-06 LAB — COMPREHENSIVE METABOLIC PANEL
ALT: 14 U/L (ref 0–53)
AST: 16 U/L (ref 0–37)
Albumin: 4.5 g/dL (ref 3.5–5.2)
Alkaline Phosphatase: 67 U/L (ref 39–117)
Anion gap: 13 (ref 5–15)
BUN: 20 mg/dL (ref 6–23)
CO2: 27 mEq/L (ref 19–32)
Calcium: 9.7 mg/dL (ref 8.4–10.5)
Chloride: 97 mEq/L (ref 96–112)
Creatinine, Ser: 1.33 mg/dL (ref 0.50–1.35)
GFR calc Af Amer: 64 mL/min — ABNORMAL LOW (ref >90)
GFR calc non Af Amer: 55 mL/min — ABNORMAL LOW (ref >90)
Glucose, Bld: 129 mg/dL — ABNORMAL HIGH (ref 70–99)
Potassium: 4.7 mEq/L (ref 3.7–5.3)
Sodium: 137 mEq/L (ref 137–147)
Total Bilirubin: 0.7 mg/dL (ref 0.3–1.2)
Total Protein: 8.8 g/dL — ABNORMAL HIGH (ref 6.0–8.3)

## 2013-12-06 LAB — CBC
HCT: 48.5 % (ref 39.0–52.0)
Hemoglobin: 16.5 g/dL (ref 13.0–17.0)
MCH: 31 pg (ref 26.0–34.0)
MCHC: 34 g/dL (ref 30.0–36.0)
MCV: 91 fL (ref 78.0–100.0)
Platelets: 327 10*3/uL (ref 150–400)
RBC: 5.33 MIL/uL (ref 4.22–5.81)
RDW: 13.7 % (ref 11.5–15.5)
WBC: 7.4 10*3/uL (ref 4.0–10.5)

## 2013-12-06 LAB — LIPASE, BLOOD: Lipase: 27 U/L (ref 11–59)

## 2013-12-06 MED ORDER — METHYLPREDNISOLONE SODIUM SUCC 125 MG IJ SOLR: 125.0000 mg | Freq: Once | INTRAMUSCULAR | Status: DC

## 2013-12-06 MED ORDER — PROMETHAZINE HCL 25 MG PO TABS: 25.0000 mg | ORAL_TABLET | Freq: Four times a day (QID) | ORAL | Status: DC | PRN

## 2013-12-06 MED ORDER — ONDANSETRON HCL 4 MG/2ML IJ SOLN
4.0000 mg | Freq: Once | INTRAMUSCULAR | Status: AC
  Administered 2013-12-06: 4 mg via INTRAVENOUS
  Filled 2013-12-06: qty 2

## 2013-12-06 MED ORDER — SODIUM CHLORIDE 0.9 % IV BOLUS (SEPSIS)
1500.0000 mL | Freq: Once | INTRAVENOUS | Status: AC
  Administered 2013-12-06: 1500 mL via INTRAVENOUS

## 2013-12-06 NOTE — ED Provider Notes (Signed)
CSN: [id number]     Arrival date & time 12/06/13  1144 History   First MD Initiated Contact with Patient 12/06/13 1235     Chief Complaint  Patient presents with  . Nausea  . Emesis  . Dizziness      HPI Patient presents emergency department complaining of nausea vomiting and diarrhea as well as some lightheadedness.  No syncope.  No significant abdominal pain.  No recent travel.  No recent antibiotics.  No blood in the stool.   Past Medical History  Diagnosis Date  . Hypertension   . Hypercholesteremia   . Renal insufficiency 08/21/2012   Past Surgical History  Procedure Laterality Date  . Hernia repair    . Prostate surgery     Family History  Problem Relation Age of Onset  . Hypertension    . Diabetes    . Heart attack     History  Substance Use Topics  . Smoking status: Never Smoker   . Smokeless tobacco: Not on file  . Alcohol Use: Yes     Comment: beer occasionally    Review of Systems  All other systems reviewed and are negative.     Allergies  Review of patient's allergies indicates no known allergies.  Home Medications   Prior to Admission medications   Medication Sig Start Date End Date Taking? Authorizing Provider  aspirin 81 MG tablet Take 81 mg by mouth daily.   Yes Historical Provider, MD  atorvastatin (LIPITOR) 20 MG tablet Take 1 tablet (20 mg total) by mouth daily. 11/26/13  Yes Willeen Niece, MD  finasteride (PROSCAR) 5 MG tablet Take 1 tablet (5 mg total) by mouth daily. 10/19/13  Yes Willeen Niece, MD  lisinopril-hydrochlorothiazide (PRINZIDE,ZESTORETIC) 20-25 MG per tablet Take 1 tablet by mouth daily. 11/26/13  Yes Willeen Niece, MD  nitroGLYCERIN (NITROSTAT) 0.4 MG SL tablet Place 1 tablet (0.4 mg total) under the tongue every 5 (five) minutes as needed for chest pain. 11/26/13   Willeen Niece, MD  promethazine (PHENERGAN) 25 MG tablet Take 1 tablet (25 mg total) by mouth every 6 (six) hours as needed for nausea or vomiting. 12/06/13    Hoy Morn, MD   BP 132/91  Pulse 61  Temp(Src) 97.6 F (36.4 C)  Resp 20  SpO2 100% Physical Exam  Nursing note and vitals reviewed. Constitutional: He is oriented to person, place, and time. He appears well-developed and well-nourished.  HENT:  Head: Normocephalic and atraumatic.  Eyes: EOM are normal.  Neck: Normal range of motion.  Cardiovascular: Normal rate, regular rhythm, normal heart sounds and intact distal pulses.   Pulmonary/Chest: Effort normal and breath sounds normal. No respiratory distress.  Abdominal: Soft. He exhibits no distension. There is no tenderness.  Musculoskeletal: Normal range of motion.  Neurological: He is alert and oriented to person, place, and time.  Skin: Skin is warm and dry.  Psychiatric: He has a normal mood and affect. Judgment normal.    ED Course  Procedures (including critical care time) Labs Review Labs Reviewed  COMPREHENSIVE METABOLIC PANEL - Abnormal; Notable for the following:    Glucose, Bld 129 (*)    Total Protein 8.8 (*)    GFR calc non Af Amer 55 (*)    GFR calc Af Amer 64 (*)    All other components within normal limits  CBC  LIPASE, BLOOD    Imaging Review No results found.   EKG Interpretation None  MDM   Final diagnoses:  Nausea vomiting and diarrhea    2:11 PM Patient improved at this time.  Discharge home in good condition.    Hoy Morn, MD 12/06/13 [phone]

## 2013-12-06 NOTE — Discharge Instructions (Signed)

## 2013-12-06 NOTE — ED Notes (Signed)
Pt c/o n/v/d and lightheadedness that started this morning. States he has also been having cold sweats.  Denies pain.

## 2013-12-08 ENCOUNTER — Encounter: Payer: Self-pay | Admitting: *Deleted

## 2013-12-13 ENCOUNTER — Ambulatory Visit (HOSPITAL_COMMUNITY): Admission: RE | Admit: 2013-12-13 | Payer: No Typology Code available for payment source | Source: Ambulatory Visit

## 2013-12-16 ENCOUNTER — Ambulatory Visit (HOSPITAL_COMMUNITY)
Admission: RE | Admit: 2013-12-16 | Discharge: 2013-12-16 | Disposition: A | Discharge status: Home / Self Care | Payer: No Typology Code available for payment source | Source: Ambulatory Visit | Attending: Family Medicine | Admitting: Family Medicine

## 2013-12-16 DIAGNOSIS — R29898 Other symptoms and signs involving the musculoskeletal system: Secondary | ICD-10-CM

## 2013-12-20 NOTE — Progress Notes (Signed)
Patient ID: Adam Burton, male   DOB: 1950-06-18, 63 y.o.   MRN: [id number]   62 yo seen in ER 10/12 for nausea , vomiting and dizziness.  Vitals normal.  Telemetry no arrhythmia  No ECG done.  D/C without diagnosis.  CRF;s HTN and  Elevated cholesterol on good Rx  Multiple other somatic complaints including chest pain.  LUE weakness CT with ? Small vessel disease MRI ordered  CT done 2013 to  R/o PE showed a small area of punctate calcification in the distal LAD  Pains related to food and drink  He smokes marajuana weekly and occasional ETOH   Originally from  [place]  Denies history of PUD/GERD  Pain not related to exercise    Echo 10/8  Essentially normal  Study Conclusions  - Left ventricle: Global LV strain is -19.4% The cavity size was normal. Systolic function was normal. The estimated ejection fraction was in the range of 55% to 60%. Wall motion was normal; there were no regional wall motion abnormalities. - Aortic valve: Trileaflet; normal thickness, mildly calcified leaflets. - Left atrium: The atrium was mildly dilated.       ROS: Denies fever, malais, weight loss, blurry vision, decreased visual acuity, cough, sputum, SOB, hemoptysis, pleuritic pain, palpitaitons, heartburn, abdominal pain, melena, lower extremity edema, claudication, or rash.  All other systems reviewed and negative   General: Affect appropriate Healthy:  appears stated age 46: normal Neck supple with no adenopathy JVP normal no bruits no thyromegaly Lungs clear with no wheezing and good diaphragmatic motion Heart:  S1/S2 no murmur,rub, gallop or click PMI normal Abdomen: benighn, BS positve, no tenderness, no AAA no bruit.  No HSM or HJR Distal pulses intact with no bruits No edema Neuro non-focal Skin warm and dry No muscular weakness  Medications Current Outpatient Prescriptions  Medication Sig Dispense Refill  . aspirin 81 MG tablet Take 81 mg by mouth daily.      [name] atorvastatin  (LIPITOR) 20 MG tablet Take 1 tablet (20 mg total) by mouth daily.  30 tablet  5  . finasteride (PROSCAR) 5 MG tablet Take 1 tablet (5 mg total) by mouth daily.  30 tablet  5  . lisinopril-hydrochlorothiazide (PRINZIDE,ZESTORETIC) 20-25 MG per tablet Take 1 tablet by mouth daily.  30 tablet  6  . nitroGLYCERIN (NITROSTAT) 0.4 MG SL tablet Place 1 tablet (0.4 mg total) under the tongue every 5 (five) minutes as needed for chest pain.  25 tablet  0  . promethazine (PHENERGAN) 25 MG tablet Take 1 tablet (25 mg total) by mouth every 6 (six) hours as needed for nausea or vomiting.  30 tablet  0   No current facility-administered medications for this visit.    Allergies Review of patient's allergies indicates no known allergies.  Family History: Family History  Problem Relation Age of Onset  . Heart attack Mother   . Diabetes Mother   . Cancer Sister     Breast Cancer  . Diabetes Sister   . Diabetes Brother   . Hypertension Brother   . Diabetes Maternal Grandmother   . Heart attack Maternal Grandmother     Social History: History   Social History  . Marital Status: Widowed    Spouse Name: N/A    Number of Children: N/A  . Years of Education: N/A   Occupational History  . Not on file.   Social History Main Topics  . Smoking status: Never Smoker   . Smokeless tobacco: Not on  file  . Alcohol Use: Yes     Comment: beer occasionally  . Drug Use: Yes    Special: Marijuana  . Sexual Activity: Not on file   Other Topics Concern  . Not on file   Social History Narrative  . No narrative on file    Past Surgical History  Procedure Laterality Date  . Hernia repair    . Prostate surgery      Past Medical History  Diagnosis Date  . Hypertension   . Hypercholesteremia   . Renal insufficiency 08/21/2012    Electrocardiogram:  10/19/13  SR nonspecific ST/T wave changes   Assessment and Plan

## 2013-12-21 ENCOUNTER — Encounter: Payer: Self-pay | Admitting: Cardiovascular Disease

## 2013-12-21 ENCOUNTER — Ambulatory Visit (INDEPENDENT_AMBULATORY_CARE_PROVIDER_SITE_OTHER): Payer: No Typology Code available for payment source | Admitting: Cardiovascular Disease

## 2013-12-21 ENCOUNTER — Encounter: Payer: Self-pay | Admitting: Family Medicine

## 2013-12-21 VITALS — BP 136/82 | HR 81 | Ht 71.0 in | Wt 171.0 lb

## 2013-12-21 DIAGNOSIS — R0789 Other chest pain: Secondary | ICD-10-CM

## 2013-12-21 DIAGNOSIS — I10 Essential (primary) hypertension: Secondary | ICD-10-CM

## 2013-12-21 DIAGNOSIS — R7301 Impaired fasting glucose: Secondary | ICD-10-CM

## 2013-12-21 DIAGNOSIS — E785 Hyperlipidemia, unspecified: Secondary | ICD-10-CM

## 2013-12-21 NOTE — Assessment & Plan Note (Signed)
Well controlled.  Continue current medications and low sodium Dash type diet.    

## 2013-12-21 NOTE — Assessment & Plan Note (Signed)
Discussed low carb diet.  Target hemoglobin A1c is 6.5 or less.  Continue current medications.  

## 2013-12-21 NOTE — Assessment & Plan Note (Signed)
Abnormal ECG Atypical related to food  F/U stress echo

## 2013-12-21 NOTE — Assessment & Plan Note (Signed)
Cholesterol is at goal.  Continue current dose of statin and diet Rx.  No myalgias or side effects.  F/U  LFT's in 6 months. Lab Results  Component Value Date   LDLCALC 114* 10/19/2013

## 2013-12-21 NOTE — Progress Notes (Signed)
Patient needs a referral to get an MRI at a place where it is not totally closed in because he is claustrophobic.

## 2013-12-21 NOTE — Patient Instructions (Signed)
Your physician recommends that you schedule a follow-up appointment in:   AS NEEDED   Your physician recommends that you continue on your current medications as directed. Please refer to the Current Medication list given to you today.   Your physician has requested that you have a stress echocardiogram. For further information please visit [email or web address]. Please follow instruction sheet as given.  

## 2013-12-21 NOTE — Progress Notes (Signed)
Spoke with patient reminded him that he has a rx to be picked up for his anxiety prior to procedure, he will pick up today.

## 2013-12-26 HISTORY — PX: OTHER SURGICAL HISTORY: SHX169

## 2013-12-27 ENCOUNTER — Inpatient Hospital Stay (HOSPITAL_COMMUNITY): Admission: RE | Admit: 2013-12-27 | Payer: No Typology Code available for payment source | Source: Ambulatory Visit

## 2013-12-27 ENCOUNTER — Ambulatory Visit (HOSPITAL_COMMUNITY): Payer: No Typology Code available for payment source

## 2014-01-04 ENCOUNTER — Ambulatory Visit (HOSPITAL_BASED_OUTPATIENT_CLINIC_OR_DEPARTMENT_OTHER): Payer: No Typology Code available for payment source

## 2014-01-04 ENCOUNTER — Ambulatory Visit (HOSPITAL_COMMUNITY): Payer: No Typology Code available for payment source | Attending: Cardiology | Admitting: Radiology

## 2014-01-04 DIAGNOSIS — I10 Essential (primary) hypertension: Secondary | ICD-10-CM | POA: Insufficient documentation

## 2014-01-04 DIAGNOSIS — R0789 Other chest pain: Secondary | ICD-10-CM

## 2014-01-04 DIAGNOSIS — R0989 Other specified symptoms and signs involving the circulatory and respiratory systems: Secondary | ICD-10-CM

## 2014-01-04 NOTE — Progress Notes (Signed)
Stress Echocardiogram performed.  

## 2014-01-05 ENCOUNTER — Telehealth: Payer: Self-pay | Admitting: Family Medicine

## 2014-01-05 NOTE — Telephone Encounter (Signed)
Patient had misplaced rx for Ativan, he has now found it and will have it filled and follow directions prior to MRI.

## 2014-01-05 NOTE — Telephone Encounter (Signed)
Patient will have an MRI on 11/16 and he cannot remember what medicine he has to take previous to the exam because he is claustrophobic. Please follow up with Patient

## 2014-01-10 ENCOUNTER — Ambulatory Visit (HOSPITAL_COMMUNITY)
Admission: RE | Admit: 2014-01-10 | Discharge: 2014-01-10 | Disposition: A | Discharge status: Home / Self Care | Payer: Medicare Other | Source: Ambulatory Visit | Attending: Family Medicine | Admitting: Family Medicine

## 2014-01-10 DIAGNOSIS — N189 Chronic kidney disease, unspecified: Secondary | ICD-10-CM | POA: Insufficient documentation

## 2014-01-10 DIAGNOSIS — R29898 Other symptoms and signs involving the musculoskeletal system: Secondary | ICD-10-CM | POA: Insufficient documentation

## 2014-01-10 DIAGNOSIS — I129 Hypertensive chronic kidney disease with stage 1 through stage 4 chronic kidney disease, or unspecified chronic kidney disease: Secondary | ICD-10-CM | POA: Diagnosis not present

## 2014-01-10 MED ORDER — GADOBENATE DIMEGLUMINE 529 MG/ML IV SOLN
15.0000 mL | Freq: Once | INTRAVENOUS | Status: AC | PRN
  Administered 2014-01-10: 15 mL via INTRAVENOUS

## 2014-01-11 ENCOUNTER — Telehealth: Payer: Self-pay | Admitting: Family Medicine

## 2014-01-11 DIAGNOSIS — R351 Nocturia: Secondary | ICD-10-CM

## 2014-01-11 MED ORDER — LISINOPRIL-HYDROCHLOROTHIAZIDE 20-25 MG PO TABS: 1.0000 | ORAL_TABLET | Freq: Every day | ORAL | Status: DC

## 2014-01-11 MED ORDER — FINASTERIDE 5 MG PO TABS: 5.0000 mg | ORAL_TABLET | Freq: Every day | ORAL | Status: DC

## 2014-01-11 NOTE — Telephone Encounter (Signed)
Called patient to report the results of the MRI brain. Still with some chest pain; not with weakness in the left arm (occasional stabbing, mild and intermittent). He is to follow up with me as available. JB

## 2014-01-13 ENCOUNTER — Ambulatory Visit (HOSPITAL_COMMUNITY): Admission: RE | Admit: 2014-01-13 | Payer: No Typology Code available for payment source | Source: Ambulatory Visit

## 2014-02-11 ENCOUNTER — Ambulatory Visit (INDEPENDENT_AMBULATORY_CARE_PROVIDER_SITE_OTHER): Payer: Medicare Other | Admitting: Family Medicine

## 2014-02-11 ENCOUNTER — Encounter: Payer: Self-pay | Admitting: Family Medicine

## 2014-02-11 VITALS — BP 142/86 | HR 67 | Temp 98.8°F | Ht 71.0 in | Wt 179.5 lb

## 2014-02-11 DIAGNOSIS — K2970 Gastritis, unspecified, without bleeding: Secondary | ICD-10-CM

## 2014-02-11 DIAGNOSIS — Z1211 Encounter for screening for malignant neoplasm of colon: Secondary | ICD-10-CM

## 2014-02-11 DIAGNOSIS — I639 Cerebral infarction, unspecified: Secondary | ICD-10-CM

## 2014-02-11 DIAGNOSIS — R0789 Other chest pain: Secondary | ICD-10-CM

## 2014-02-11 MED ORDER — OMEPRAZOLE 40 MG PO CPDR: 40.0000 mg | DELAYED_RELEASE_CAPSULE | Freq: Every day | ORAL | Status: DC

## 2014-02-11 NOTE — Assessment & Plan Note (Signed)
Mild epigastric pain that does not radiate. Exam with mild discomfort along epigastrium with deep palpation. Advised that alcohol and smoking may contribute to worsening of symptoms. Trial PPIs, to discuss with GI. Asked to return to see me if not improved/resolved within 4 weeks.

## 2014-02-11 NOTE — Assessment & Plan Note (Signed)
Counseled on importance of colon cancer screening. Since his last visit here, patient has enrolled in Medicare and may pursue screening colonoscopy without a referral.  Our clinic information (with GI contact information) given to patient at conclusion of visit.

## 2014-02-11 NOTE — Assessment & Plan Note (Signed)
Has not had recurrence of chest pain. Reviewed results of recent stress ECHO (November), he has received the results from his cardiologist as well. Not using NTG at all. Continue to control modifiable risk factors for heart disease.

## 2014-02-11 NOTE — Progress Notes (Signed)
   Subjective:    Patient ID: Adam Burton, male    DOB: Dec 18, 1950, 63 y.o.   MRN: [id number]  HPI Patient here for follow up. He has already received the results of recent stress ECHO (November) from Cardiology. Unremarkable study. He reports that since then, no further chest pain, no weakness or dyspnea. Feels well overall. Is not using sublingual NTG.   Complaint today is a mild "lancing" abdominal pain, most prominent across RUQ/epigastrium and most notable when sitting. Not present when walking/standing. Rates it a 2-3/10 when sitting, zero when standing. Not associated with meals (or with not eating). Not radiating retrosternally. No nausea/vomiting. No diarrhea. Has two soft and formed non-bloody stools a day. No changes in his bowel habits. Had collected stool for FOBT last July 2014, however specimen was inadequate for testing.  He has recently enrolled in Medicare and would like screening colonoscopy.   Surgical Hx: S/p appy as a child; prostate surgery in the past.   Social Hx; Drinks 2 shots of alcohol every 2-3 days. Smokes THC regularly, no tobacco.    Review of Systems No fevers or chills, no chest pain, no cough, no dyspnea. No nausea/emesis, no diarrhea, no blood per rectum.      Objective:   Physical Exam Generally well appearing, no apparent distress HEENT Neck supple, no cervical adenopathy. No supraclavicular adenopathy. Moist mucus membranes.  COR Regular S1S2, no extra sounds PULM Clear bilaterally, no rales or wheezes ABD Soft, nontender with exception of some mild discomfort/tenderness across epigastrium with deep palpation. No masses or megaly. Negative Murphys sign. Audible bowel sounds throughout.        Assessment & Plan:

## 2014-02-11 NOTE — Patient Instructions (Signed)
It was a pleasure to see you today.   I am starting you on a trial of an acid-blocking medicine for the abdominal pain, which I believe is related to gastritis.   I am glad to give you information about colon cancer screening with colonoscopy.  You do not need a referral for this, but I am giving you contact information to schedule the study.   Follow up in the coming 6 weeks if the abdominal pain is not better.

## 2014-03-22 ENCOUNTER — Encounter: Payer: Self-pay | Admitting: Family Medicine

## 2014-03-22 ENCOUNTER — Ambulatory Visit (INDEPENDENT_AMBULATORY_CARE_PROVIDER_SITE_OTHER): Payer: Medicare Other | Admitting: Family Medicine

## 2014-03-22 VITALS — BP 130/86 | HR 68 | Temp 98.1°F | Ht 71.0 in | Wt 183.2 lb

## 2014-03-22 DIAGNOSIS — N342 Other urethritis: Secondary | ICD-10-CM | POA: Insufficient documentation

## 2014-03-22 MED ORDER — AZITHROMYCIN 250 MG PO TABS
1000.0000 mg | ORAL_TABLET | Freq: Once | ORAL | Status: AC
  Administered 2014-03-22: 1000 mg via ORAL

## 2014-03-22 MED ORDER — CEFTRIAXONE SODIUM 1 G IJ SOLR
250.0000 mg | Freq: Once | INTRAMUSCULAR | Status: AC
  Administered 2014-03-22: 250 mg via INTRAMUSCULAR

## 2014-03-22 NOTE — Progress Notes (Signed)
   Subjective:    Patient ID: Adam Burton, male    DOB: 1950-10-30, 64 y.o.   MRN: [id number]  HPI Patient for same day visit, for complaint of urethral discomfort with voiding.  Had sexual relations with a friend visiting from [place] on 01/22, condom broke. Soon thereafter he had irritation with urination. No polyuria, no frank hematuria. No fevers or chills. Unsure if he had penile discharge. Says it "felt like I did when I had STDs when I was younger". Unsure if his partner has had recent STDs. He is offered HIV and RPR testing, declines these but says he may be interested in the coming months.   Recommended for dirty urine collection for GC/Chlamydia testing, also UA, declines all of these. Counseled on ways to reduce likelihood of contracting STI.  Review of Systems     Objective:   Physical Exam  Well appearing, no apparent distress GU: Uncircumcised male, mild erythema around urethral meatus. No lesions on shaft of penis.       Assessment & Plan:

## 2014-03-22 NOTE — Patient Instructions (Signed)
It was a pleasure to see you today.   You were given azithromycin 1gram by mouth, and ceftriaxone 250mg  injection today.  Please come back if symptoms not completed resolved in the coming 1-2 weeks.   For the back pain, you may take Tylenol 650mg  every 4 to 6 hours as needed; heating pads as needed. If pain is more severe, may take ibuprofen 400 to 600mg  (2 to 3 of the over-the-counter tablets) by mouth every 6 to 8 hours with food, for a maximum of 7 days.   If the back pain is not better in the coming 2 weeks, please make another appointment.

## 2014-11-01 ENCOUNTER — Encounter: Payer: Self-pay | Admitting: Family Medicine

## 2014-11-01 ENCOUNTER — Ambulatory Visit (INDEPENDENT_AMBULATORY_CARE_PROVIDER_SITE_OTHER): Payer: Medicare Other | Admitting: Family Medicine

## 2014-11-01 VITALS — BP 168/102 | HR 64 | Temp 97.7°F | Ht 71.0 in | Wt 181.9 lb

## 2014-11-01 DIAGNOSIS — H269 Unspecified cataract: Secondary | ICD-10-CM

## 2014-11-01 DIAGNOSIS — I10 Essential (primary) hypertension: Secondary | ICD-10-CM

## 2014-11-01 LAB — COMPREHENSIVE METABOLIC PANEL
ALT: 20 U/L (ref 9–46)
AST: 35 U/L (ref 10–35)
Albumin: 4.4 g/dL (ref 3.6–5.1)
Alkaline Phosphatase: 51 U/L (ref 40–115)
BUN: 13 mg/dL (ref 7–25)
CO2: 25 mmol/L (ref 20–31)
Calcium: 9.3 mg/dL (ref 8.6–10.3)
Chloride: 102 mmol/L (ref 98–110)
Creat: 1.12 mg/dL (ref 0.70–1.25)
Glucose, Bld: 106 mg/dL — ABNORMAL HIGH (ref 65–99)
Potassium: 6 mmol/L — ABNORMAL HIGH (ref 3.5–5.3)
Sodium: 137 mmol/L (ref 135–146)
Total Bilirubin: 0.6 mg/dL (ref 0.2–1.2)
Total Protein: 7.7 g/dL (ref 6.1–8.1)

## 2014-11-01 LAB — LIPID PANEL
Cholesterol: 225 mg/dL — ABNORMAL HIGH (ref 125–200)
HDL: 38 mg/dL — ABNORMAL LOW (ref >=40)
LDL Cholesterol: 164 mg/dL — ABNORMAL HIGH (ref <130)
Total CHOL/HDL Ratio: 5.9 Ratio — ABNORMAL HIGH (ref <=5.0)
Triglycerides: 113 mg/dL (ref <150)
VLDL: 23 mg/dL (ref <30)

## 2014-11-01 LAB — CBC
HCT: 44.2 % (ref 39.0–52.0)
Hemoglobin: 15.2 g/dL (ref 13.0–17.0)
MCH: 30.5 pg (ref 26.0–34.0)
MCHC: 34.4 g/dL (ref 30.0–36.0)
MCV: 88.6 fL (ref 78.0–100.0)
MPV: 10 fL (ref 8.6–12.4)
Platelets: 356 10*3/uL (ref 150–400)
RBC: 4.99 MIL/uL (ref 4.22–5.81)
RDW: 14.2 % (ref 11.5–15.5)
WBC: 4.9 10*3/uL (ref 4.0–10.5)

## 2014-11-01 MED ORDER — LISINOPRIL-HYDROCHLOROTHIAZIDE 20-25 MG PO TABS: 1.0000 | ORAL_TABLET | Freq: Every day | ORAL | Status: DC

## 2014-11-01 NOTE — Progress Notes (Signed)
Subjective:    Adam Burton is a 64 y.o. male who presents to [facility] today for to meet me as his new physician and to follow-up on his blood pressure:  1.   Hypertension:  Long-term problem for this patient.  No adverse effects from medication.  Not checking it regularly.  No HA, CP, dizziness, shortness of breath, palpitations, or LE swelling.  Hasn't taken his blood pressure medications for at least 3 weeks as he's been out of them.  Previously controlled on them.  BP Readings from Last 3 Encounters:  11/01/14 168/102  03/22/14 130/86  02/11/14 142/86   2.  Left cataract:  Present for years.  Has not been able to follow up on this due to insurance issues.  Now has Medicare.  States vision in that eye is "blurry." No changes for past year at least.    ROS as above per HPI, otherwise neg.  Pertinently, no chest pain, palpitations, SOB, Fever, Chills, Abd pain, N/V/D.   The following portions of the patient's history were reviewed and updated as appropriate: allergies, current medications, past medical history, family and social history, and problem list. Patient is a nonsmoker.    PMH reviewed.  Past Medical History  Diagnosis Date  . Hypertension   . Hypercholesteremia   . Renal insufficiency 08/21/2012   Past Surgical History  Procedure Laterality Date  . Hernia repair    . Prostate surgery      Medications reviewed. Current Outpatient Prescriptions  Medication Sig Dispense Refill  . aspirin 81 MG tablet Take 81 mg by mouth daily.    [name] atorvastatin (LIPITOR) 20 MG tablet Take 1 tablet (20 mg total) by mouth daily. 30 tablet 5  . finasteride (PROSCAR) 5 MG tablet Take 1 tablet (5 mg total) by mouth daily. 30 tablet 5  . lisinopril-hydrochlorothiazide (PRINZIDE,ZESTORETIC) 20-25 MG per tablet Take 1 tablet by mouth daily. 30 tablet 6  . omeprazole (PRILOSEC) 40 MG capsule Take 1 capsule (40 mg total) by mouth daily. 30 capsule 3   No current facility-administered medications for  this visit.     Objective:   Physical Exam BP 191/109 mmHg  Pulse 64  Temp(Src) 97.7 F (36.5 C) (Oral)  Ht 5\' 11"  (1.803 m)  Wt 181 lb 14.4 oz (82.509 kg)  BMI 25.38 kg/m2 Gen:  Alert, cooperative patient who appears stated age in no acute distress.  Vital signs reviewed. HEENT: EOMI,  MMM.  Left eye with haziness typical of cataract.   Cardiac:  Regular rate and rhythm  Pulm:  Clear to auscultation bilaterally with good air movement.  .   Abd:  Soft/nondistended/nontender.   Exts: Non edematous BL  LE, warm and well perfused.  Psych:  Pleasant and conversant.    No results found for this or any previous visit (from the past 72 hour(s)).

## 2014-11-01 NOTE — Patient Instructions (Addendum)
Your blood pressure is still a little high.  I have refilled your medicines today.  Checking labs today.  I have put in a referral for the eye doctor.  Come back to see me to make sure your blood pressure is a little better.    It was good to meet you today!

## 2014-11-03 ENCOUNTER — Telehealth: Payer: Self-pay | Admitting: Family Medicine

## 2014-11-03 DIAGNOSIS — H269 Unspecified cataract: Secondary | ICD-10-CM | POA: Insufficient documentation

## 2014-11-03 DIAGNOSIS — E875 Hyperkalemia: Secondary | ICD-10-CM

## 2014-11-03 NOTE — Assessment & Plan Note (Signed)
Refer to ophthalmology for further management and recommendations.

## 2014-11-03 NOTE — Assessment & Plan Note (Addendum)
Elevated today, even on recheck. Not surprising as he has been out of his medicines.   Refills provided today.   FU in 1 month for recheck to ensure they are back to normal range.  Checking labs today.

## 2014-11-03 NOTE — Telephone Encounter (Signed)
Called to discuss elevated lipids plus potassium (hemolyzed sample).  He wasn't taking his statin at last visit, will recommend this.  Will also recommend repeating BMET to ensure K+ is good.   Had to leave message.  Will call when patient calls back.

## 2014-11-07 NOTE — Telephone Encounter (Signed)
Pt returning call; states that anytime between now and 4pm this evening is a good time to return call. Thank you, Fonda Kinder, ASA

## 2014-11-14 MED ORDER — ATORVASTATIN CALCIUM 20 MG PO TABS: 20.0000 mg | ORAL_TABLET | Freq: Every day | ORAL | Status: DC

## 2014-11-14 NOTE — Telephone Encounter (Signed)
Called and discussed lab results with patient.  Hyperlipidemia noted, previously treated with statin but he was not on this, states no refills from prior physician.  Hasn't been on this for several months at least.  Will send this back in.    Also, hemolyzed potassium.  Repeat BMET. He will call and make a lab appt for this.  No palpitations.   Appreciative of call.

## 2014-11-16 ENCOUNTER — Other Ambulatory Visit: Payer: Medicare Other

## 2014-11-16 DIAGNOSIS — E875 Hyperkalemia: Secondary | ICD-10-CM

## 2014-11-16 LAB — BASIC METABOLIC PANEL
BUN: 15 mg/dL (ref 7–25)
CO2: 27 mmol/L (ref 20–31)
Calcium: 9.1 mg/dL (ref 8.6–10.3)
Chloride: 101 mmol/L (ref 98–110)
Creat: 1.3 mg/dL — ABNORMAL HIGH (ref 0.70–1.25)
Glucose, Bld: 111 mg/dL — ABNORMAL HIGH (ref 65–99)
Potassium: 4.3 mmol/L (ref 3.5–5.3)
Sodium: 136 mmol/L (ref 135–146)

## 2014-11-16 NOTE — Progress Notes (Signed)
Bmp done today Jobe Mutch 

## 2014-11-17 ENCOUNTER — Telehealth: Payer: Self-pay | Admitting: Family Medicine

## 2014-11-17 NOTE — Telephone Encounter (Signed)
Please let Mr. Slyter know that his potassium is back to completely normal and the other measurement was a lab error.  Let me know if he has any questions.  Thanks!  JW

## 2014-11-18 NOTE — Telephone Encounter (Signed)
Spoke to pt. Informed him of the information below. Ottis Stain, CMA

## 2015-01-26 ENCOUNTER — Encounter: Payer: Self-pay | Admitting: Student

## 2015-01-26 ENCOUNTER — Ambulatory Visit (INDEPENDENT_AMBULATORY_CARE_PROVIDER_SITE_OTHER): Payer: Medicare Other | Admitting: Student

## 2015-01-26 VITALS — BP 152/86 | HR 63 | Temp 97.9°F | Wt 175.6 lb

## 2015-01-26 DIAGNOSIS — I10 Essential (primary) hypertension: Secondary | ICD-10-CM | POA: Diagnosis not present

## 2015-01-26 DIAGNOSIS — R0789 Other chest pain: Secondary | ICD-10-CM | POA: Diagnosis not present

## 2015-01-26 MED ORDER — LISINOPRIL-HYDROCHLOROTHIAZIDE 20-25 MG PO TABS: 1.0000 | ORAL_TABLET | Freq: Every day | ORAL | Status: DC

## 2015-01-26 NOTE — Assessment & Plan Note (Signed)
Blood pressure slightly elevated. He says he has been out of his medication for about a week. -Refilled his lisinopril/HCTZ today

## 2015-01-26 NOTE — Assessment & Plan Note (Addendum)
This has been going on for three months. There was no changes over the last three months other than common cold for the last 4 days. Patient says he came in because he is curious and wanted to know. Broad differentials diagnosis which includes pneumonia, ACS, PE, lung cancer & GERD. However, this are unlikely based on his history and exam. Had Echo about a year ago which was normal. He has swollen and erythematous inferior turbinates bilaterally likely from common cold. Recommended saline spray and reassured the patient. Will get chest x-ray if no improvement.

## 2015-01-26 NOTE — Patient Instructions (Addendum)
It was great seeing you today! We have addressed the following issues today  1. Tightness in your chest, nasal congestion and cough: this likely from you common cold. Cough might take up to 6 weeks to clear. I would like you to try saline nasal spray for congestion. You can come back if things get worse.  2. Blood pressure: I have sent prescription to your pharmacy.   If we did any lab work today, and the results require attention, either me or my nurse will get in touch with you. If everything is normal, you will get a letter in mail. If you don't hear from [place] in two weeks, please give [place] a call. Otherwise, I look forward to talking with you again at our next visit. If you have any questions or concerns before then, please call the clinic at [phone].  Please bring all your medications to every doctors visit   Sign up for My Chart to have easy access to your labs results, and communication with your Primary care physician.    Please check-out at the front desk before leaving the clinic.   Take Care,

## 2015-01-26 NOTE — Progress Notes (Signed)
   Subjective:    Patient ID: Adam Burton, male    DOB: August 22, 1950, 64 y.o.   MRN: [id number]  HPI Chest tightness: for about two to three months. Had Echo about a year which was normal. CXR in 2013 unremarkable. He has common cold with congestion and cough for 4 days. He hears some noise on the left side of his chest since he developed common cold. He is "curious" about this and wanted to know what is going on. He takes zicam for cold that helped with congestion a little bit. Denies reflux but reports having a lot of mucus when he wakes up in the morning. He says the mucus looks whitish/grayish. Denies hemoptysis.No chest pain but discomfort when he coughs. Denies shortness of breath. Denies pain in his jaw or arm. Denies sore throat, fever, chills and night sweating or unintentional weight loss. Denies nausea, vomiting or diarrhea.    Hypertension: reports that he has been out of his blood pressure medication for one week.   Social history: drinks a two shots of vodka every other day. Smokes marijuana. Denies smoking cigarette or using other recreational drugs.  Review of Systems Per HPI  Objective:   Physical Exam   Filed Vitals:   01/26/15 0842  BP: 152/86  Pulse: 63  Temp: 97.9 F (36.6 C)  TempSrc: Oral  Weight: 175 lb 9.6 oz (79.652 kg)  SpO2: 97%   Gen: well-appearing, no acute distress  Nares: significant for swollen turbinates and erythema, no rhinorrhea Oropharynx: clear, moist, no exudation, no cobble stoneings Neck: supple without LAD CV: regular rate and rythem. S1 & S2 audible, no murmurs, no sign of fluid overload. Resp: no apparent WOB, CTAB. Abd: no epigastric tenderness Skin: no rash over his chest or abdomen    Assessment & Plan:  Discomfort in chest This has been going on for three months. There was no changes over the last three months other than common cold for the last 4 days. Patient says he came in because he is curious and wanted to know. Broad  differentials diagnosis which includes pneumonia, ACS, PE, lung cancer & GERD. However, this are unlikely based on his history and exam. Had Echo about a year ago which was normal. He has swollen and erythematous inferior turbinates bilaterally likely from common cold. Recommended saline spray and reassured the patient. Will get chest x-ray if no improvement.  Essential hypertension, benign Blood pressure slightly elevated. He says he has been out of his medication for about a week. -Refilled his lisinopril/HCTZ today

## 2015-01-26 NOTE — Addendum Note (Signed)
Addended by: Wendee Beavers T on: 01/26/2015 10:55 AM   Modules accepted: Level of Service

## 2015-01-31 ENCOUNTER — Ambulatory Visit (INDEPENDENT_AMBULATORY_CARE_PROVIDER_SITE_OTHER): Payer: Medicare Other | Admitting: Family Medicine

## 2015-01-31 ENCOUNTER — Encounter: Payer: Self-pay | Admitting: Family Medicine

## 2015-01-31 VITALS — BP 149/103 | HR 78 | Temp 98.3°F | Ht 71.0 in | Wt 181.0 lb

## 2015-01-31 DIAGNOSIS — N400 Enlarged prostate without lower urinary tract symptoms: Secondary | ICD-10-CM | POA: Diagnosis not present

## 2015-01-31 DIAGNOSIS — I10 Essential (primary) hypertension: Secondary | ICD-10-CM | POA: Diagnosis not present

## 2015-01-31 NOTE — Patient Instructions (Signed)
Everything looks good today.    Come back and see me in 3 months for a blood pressure recheck.  Take your pills today.   It was good to see you today.

## 2015-01-31 NOTE — Assessment & Plan Note (Signed)
Prostate exam good today. No tenderness. Follow-up in one year. Discussed PSA with him previously, declined this.

## 2015-01-31 NOTE — Progress Notes (Signed)
Subjective:    Adam Burton is a 64 y.o. male who presents to [facility] today for a prostate exam:  1.  Prostate issues:  Patient has had yearly prostate exam and PSA for past several years. He began taking saw palmetto after he was told he had BPH several years back and this greatly improved his lower urinary tract symptoms. He used to get up 3-4 times at night to urinate now is only 1 or 2 at the most. Steady stream without hesitancy. No hematuria noted. No abdominal pain or urinary retention.   ROS as above per HPI, otherwise neg.   The following portions of the patient's history were reviewed and updated as appropriate: allergies, current medications, past medical history, family and social history, and problem list. Patient is a nonsmoker.    PMH reviewed.  Past Medical History  Diagnosis Date  . Hypertension   . Hypercholesteremia   . Renal insufficiency 08/21/2012   Past Surgical History  Procedure Laterality Date  . Hernia repair    . Prostate surgery      Medications reviewed. Current Outpatient Prescriptions  Medication Sig Dispense Refill  . aspirin 81 MG tablet Take 81 mg by mouth daily.    [name] atorvastatin (LIPITOR) 20 MG tablet Take 1 tablet (20 mg total) by mouth daily. 30 tablet 5  . finasteride (PROSCAR) 5 MG tablet Take 1 tablet (5 mg total) by mouth daily. (Patient not taking: Reported on 01/26/2015) 30 tablet 5  . lisinopril-hydrochlorothiazide (PRINZIDE,ZESTORETIC) 20-25 MG tablet Take 1 tablet by mouth daily. 30 tablet 6  . omeprazole (PRILOSEC) 40 MG capsule Take 1 capsule (40 mg total) by mouth daily. (Patient not taking: Reported on 01/26/2015) 30 capsule 3   No current facility-administered medications for this visit.     Objective:   Physical Exam BP 149/103 mmHg  Pulse 78  Temp(Src) 98.3 F (36.8 C) (Oral)  Ht 5\' 11"  (1.803 m)  Wt 181 lb (82.101 kg)  BMI 25.26 kg/m2 Gen:  Alert, cooperative patient who appears stated age in no acute distress.  Vital  signs reviewed. Rectal: Good tone. No stool in vault. Symmetrically hypertrophic prostate without nodules or tenderness.  No results found for this or any previous visit (from the past 72 hour(s)).

## 2015-01-31 NOTE — Assessment & Plan Note (Signed)
Has not picked up or taken blood pressure medications since being seen here 5 days ago. Recommended to pick these up today.

## 2015-04-20 ENCOUNTER — Telehealth: Payer: Self-pay | Admitting: *Deleted

## 2015-04-20 NOTE — Telephone Encounter (Signed)
Called patient to offer flu vaccine, however, VM picked up.  Left message on patient's voice mail to return call. DUCATTE, LAURENZE L, RN   

## 2015-05-30 ENCOUNTER — Encounter: Payer: Self-pay | Admitting: Family Medicine

## 2015-05-30 ENCOUNTER — Ambulatory Visit (INDEPENDENT_AMBULATORY_CARE_PROVIDER_SITE_OTHER): Payer: Medicare Other | Admitting: Family Medicine

## 2015-05-30 VITALS — BP 152/102 | HR 69 | Temp 98.0°F | Ht 71.0 in | Wt 184.2 lb

## 2015-05-30 DIAGNOSIS — M25512 Pain in left shoulder: Secondary | ICD-10-CM | POA: Diagnosis not present

## 2015-05-30 MED ORDER — NAPROXEN 500 MG PO TABS: 500.0000 mg | ORAL_TABLET | Freq: Two times a day (BID) | ORAL | Status: DC

## 2015-05-30 NOTE — Patient Instructions (Addendum)
Thank you for coming in,   Please take the naproxen for a week and perform the exercises.   Please use ICE every time after you exercise and at least three times a day for 20 minutes.   Please bring all of your medications with you to each visit.   Sign up for My Chart to have easy access to your labs results, and communication with your Primary care physician   Please feel free to call with any questions or concerns at any time, at [phone]. --Dr. Raeford Razor

## 2015-05-30 NOTE — Assessment & Plan Note (Signed)
Limited ultrasound today showing hypoechoic region in the supraspinatus which is most likely corresponding to a partial tear of his supraspinatus Odd that he would have a partial tear from just sleeping on that affected shoulder Possible for a bursitis Doubtful for any AC joint or Biceps pathology  - Naproxen sent for a seven-day course. History of gastritis be reports no longer having symptoms. - Referral to sports medicine for a more diagnostic ultrasound and consideration of nitroglycerin patches - He will follow-up if he wants an injection or not

## 2015-05-30 NOTE — Progress Notes (Signed)
   Subjective:    Patient ID: Adam Burton, male    DOB: [birth year], 65 y.o.   MRN: [id number]  Seen for Same day visit for   CC: left shoulder pain   Two weeks of pain.  Locates the pain on the lateral side of her left shoulder  He has pain at night and feels it when he rolls on it.  Pain is lacining and annoying pain  Has taken Co10  Aleve has helped.  The pain returns when he makes a sudden movement  No numbness or tingling down in his hand  No injury or trauma  No pain like this before.  [place] the train and was sleeping on his left shoulder.  Staying the same.  No weakness in his hand.   PMH: BPH  FH: DM2   Review of Systems   See HPI for ROS. Objective:  BP 152/102 mmHg  Pulse 69  Temp(Src) 98 F (36.7 C) (Oral)  Ht 5\' 11"  (1.803 m)  Wt 184 lb 3.2 oz (83.553 kg)  BMI 25.70 kg/m2  General: NAD, He is right-handed Neuro: alert and oriented, no focal deficits Shoulder: Left shoulder Inspection reveals no abnormalities, atrophy or asymmetry. Palpation no tenderness over AC joint or bicipital groove. Has pain to palpation over the lateral shoulder Limited active range of motion with abduction but full passive. Full flexion Internal and external rotation weaker on left than compared to right Empty can weaker on left and was on right Normal scapular function observed. No painful arc and no drop arm sign. Neurovascularly intact Normal grip strength     Assessment & Plan:   Left shoulder pain Limited ultrasound today showing hypoechoic region in the supraspinatus which is most likely corresponding to a partial tear of his supraspinatus Odd that he would have a partial tear from just sleeping on that affected shoulder Possible for a bursitis Doubtful for any AC joint or Biceps pathology  - Naproxen sent for a seven-day course. History of gastritis be reports no longer having symptoms. - Referral to sports medicine for a more diagnostic ultrasound  and consideration of nitroglycerin patches - He will follow-up if he wants an injection or not

## 2015-06-14 ENCOUNTER — Ambulatory Visit (INDEPENDENT_AMBULATORY_CARE_PROVIDER_SITE_OTHER): Payer: Medicare Other | Admitting: Family Medicine

## 2015-06-14 ENCOUNTER — Encounter: Payer: Self-pay | Admitting: Family Medicine

## 2015-06-14 VITALS — BP 139/86 | Ht 71.0 in | Wt 171.0 lb

## 2015-06-14 DIAGNOSIS — M25512 Pain in left shoulder: Secondary | ICD-10-CM

## 2015-06-14 MED ORDER — METHYLPREDNISOLONE ACETATE 40 MG/ML IJ SUSP
40.0000 mg | Freq: Once | INTRAMUSCULAR | Status: AC
  Administered 2015-06-14: 40 mg via INTRA_ARTICULAR

## 2015-06-14 NOTE — Assessment & Plan Note (Signed)
Positive external impingement secondary to subacromial bursitis and supraspinatus/rotator cuff tendinopathy which is all visualized on ultrasound including dynamically. -Continue with anti-inflammatories as needed. -Subacromial injection today. -Jobe's exercises with band given the patient. -Follow-up with PCP as needed  Informed consent obtained and placed in chart.  Time out performed.  Area cleaned with iodine x 3 and wiped clear with alcohol swab.  Using 21 1/2 gauge needle 1 cc Depo 40 mg and 3 cc's 1% Lidocaine were injected in L subacromial space via posterior approach.  Sterile bandage placed.  Patient tolerated procedure well.  No complications.

## 2015-06-14 NOTE — Progress Notes (Signed)
  [name] - 65 y.o. male MRN [id number]  Date of birth: 02-01-1951  SUBJECTIVE:  Including CC & ROS.  Adam Burton is a 65 y.o. male who presents today for L shoulder pain.    Shoulder Pain L initial visit, 06/14/15 - patient returns today for ongoing left shoulder pain. This began to 3 months ago with no inciting injury. Pain is worse when sleeping on the area and he is right hand dominant. No previous injury to either shoulder. He has tried Naprosyn which has helped but otherwise has not tried anything. No paresthesias going down his arm and denies frank weakness in grip strength. Achy pain when he lays on the area.  PMHx - Updated and reviewed.  Contributory factors include: HTN, substance abuse PSHx - Updated and reviewed.  Contributory factors include:  Hernia repair FHx - Updated and reviewed.  Contributory factors include:  DM brother/Mother Social Hx - Updated and reviewed. Contributory factors include: Marijuana use   Medications - Updated/reviewed    ROS Per HPI, otherwise 12 point negative   PE: Filed Vitals:   06/14/15 1506  BP: 139/86   Gen: NAD, AAO 3 Cardio- RRR Pulm - Normal respiratory effort/rate Skin: No rashes or erythema Extremities: No edema  Vascular: pulses +2 bilateral upper and lower extremity Psych: Normal affect   MSK Shoulder:  L Shoulder: Inspection reveals no abnormalities, atrophy or asymmetry. Palpation is normal with no tenderness over AC joint or bicipital groove. ROM - 180 degrees flexion, 60 degrees extension, 150 abduction, 90 ER/IR  Rotator cuff strength 4/5 L>R + impingement with + Neer and Hawkin's tests, empty can. Speeds and Yergason's tests normal. Normal scapular function observed. + painful arc and no drop arm sign. No apprehension sign and no Jobe relocation sign  Negative Cross arm maneuver against resistance   Neurovascular status - Intact B/L UE

## 2015-09-08 ENCOUNTER — Ambulatory Visit (INDEPENDENT_AMBULATORY_CARE_PROVIDER_SITE_OTHER): Payer: Medicare Other | Admitting: Internal Medicine

## 2015-09-08 ENCOUNTER — Encounter: Payer: Self-pay | Admitting: Internal Medicine

## 2015-09-08 VITALS — BP 130/82 | HR 68 | Temp 98.0°F | Wt 175.0 lb

## 2015-09-08 DIAGNOSIS — R351 Nocturia: Secondary | ICD-10-CM

## 2015-09-08 DIAGNOSIS — N400 Enlarged prostate without lower urinary tract symptoms: Secondary | ICD-10-CM

## 2015-09-08 DIAGNOSIS — I10 Essential (primary) hypertension: Secondary | ICD-10-CM

## 2015-09-08 MED ORDER — FINASTERIDE 5 MG PO TABS: 5.0000 mg | ORAL_TABLET | Freq: Every day | ORAL | Status: DC

## 2015-09-08 MED ORDER — LISINOPRIL-HYDROCHLOROTHIAZIDE 20-25 MG PO TABS: 1.0000 | ORAL_TABLET | Freq: Every day | ORAL | Status: DC

## 2015-09-08 NOTE — Progress Notes (Signed)
   [facility] Adam Mo, MD Phone: [phone]  Reason For Visit: Same Day, Medication Refill for HTN meds and prostate.    CHRONIC HTN: Reports: BP at home 128/74  Current Meds - Taking Prinzide 20-25 mg  Reports good compliance, Did not take blood pressure medication, took last medication 2 days. Tolerating well, w/o complaints. Lifestyle - Smokes THC, no tobacco  Denies CP, dyspnea, HA, edema, dizziness / lightheadedness  Benign Prostrate Hypertrophy  - Has been off of Finasteride for over 1 year.  - Patient has been having to use bathroom 3 times a night, started at the beginning of this year. Patient was seen by Dr. Mingo Burton for a prostate exam in December of 2016 which was normal.  He was previously seen by Dr Adam Burton for BPH and was prescribed Proscar which helped significantly with his night time urination and issues with voiding. Patient endorses slight dripping with voiding, similar to previous prostate issues. No abdominal pain or urinary retention/incontience.   Past Medical History Reviewed problem list.  Medications- reviewed and updated No additions to family history Social history- patient is a THC  smoker  Objective: BP 130/82 mmHg  Pulse 68  Temp(Src) 98 F (36.7 C) (Oral)  Wt 175 lb (79.379 kg)  SpO2 99% Gen: NAD, alert, cooperative with exam Lungs:  Normal respiratory effort, Lungs are clear to auscultation, no crackles or wheezes. Heart - Regular rate and rhythm.  No murmurs, gallops or rubs.      Assessment/Plan: See problem based a/p  Essential hypertension, benign No issues with medication, blood pressure well controlled. Refilled medication. Recheck BMET.  Advised patient to follow up with PCP  Benign prostatic hyperplasia Hx of BPH, has used Proscar in the past with good results. Would like a prescription refill on this. Prostate exam last done this about 6 months ago. Indicated should be done at least yearly. Patient plans  to follow up with PCP in the next couple of months

## 2015-09-08 NOTE — Patient Instructions (Signed)
Please follow up with primary care physician Chrisandra Netters) in next 3-4 months.

## 2015-09-10 NOTE — Assessment & Plan Note (Addendum)
No issues with medication, blood pressure well controlled. Refilled medication. Recheck BMET.  Advised patient to follow up with PCP

## 2015-09-10 NOTE — Assessment & Plan Note (Addendum)
Hx of BPH, has used Proscar in the past with good results. Would like a prescription refill on this. Prostate exam last done this about 6 months ago. Indicated should be done at least yearly. Patient plans to follow up with PCP in the next couple of months

## 2015-09-19 ENCOUNTER — Other Ambulatory Visit: Payer: Medicare Other

## 2015-09-19 DIAGNOSIS — I10 Essential (primary) hypertension: Secondary | ICD-10-CM

## 2015-09-19 LAB — BASIC METABOLIC PANEL WITH GFR
BUN: 15 mg/dL (ref 7–25)
CO2: 24 mmol/L (ref 20–31)
Calcium: 9.7 mg/dL (ref 8.6–10.3)
Chloride: 102 mmol/L (ref 98–110)
Creat: 1.36 mg/dL — ABNORMAL HIGH (ref 0.70–1.25)
GFR, Est African American: 62 mL/min (ref >=60)
GFR, Est Non African American: 54 mL/min — ABNORMAL LOW (ref >=60)
Glucose, Bld: 147 mg/dL — ABNORMAL HIGH (ref 65–99)
Potassium: 4.2 mmol/L (ref 3.5–5.3)
Sodium: 136 mmol/L (ref 135–146)

## 2015-09-28 ENCOUNTER — Telehealth: Payer: Self-pay | Admitting: Internal Medicine

## 2015-09-28 NOTE — Telephone Encounter (Signed)
Called patient to make an appointment for A1C screening and other follow up.

## 2015-10-10 ENCOUNTER — Encounter: Payer: Self-pay | Admitting: Internal Medicine

## 2015-10-10 ENCOUNTER — Ambulatory Visit (INDEPENDENT_AMBULATORY_CARE_PROVIDER_SITE_OTHER): Payer: Medicare Other | Admitting: Internal Medicine

## 2015-10-10 VITALS — BP 124/80 | HR 64 | Temp 98.4°F | Ht 71.0 in | Wt 175.0 lb

## 2015-10-10 DIAGNOSIS — N400 Enlarged prostate without lower urinary tract symptoms: Secondary | ICD-10-CM

## 2015-10-10 DIAGNOSIS — R7301 Impaired fasting glucose: Secondary | ICD-10-CM | POA: Diagnosis not present

## 2015-10-10 DIAGNOSIS — I10 Essential (primary) hypertension: Secondary | ICD-10-CM | POA: Diagnosis not present

## 2015-10-10 DIAGNOSIS — R7303 Prediabetes: Secondary | ICD-10-CM

## 2015-10-10 LAB — POCT GLYCOSYLATED HEMOGLOBIN (HGB A1C): Hemoglobin A1C: 6.4

## 2015-10-10 NOTE — Progress Notes (Signed)
   [facility] [name], MD Phone: [phone]  Reason For Visit:  Follow up for CKD   # Prediabetic, Borderline in the past. Patient with significant family history of type 2 diabetes in mother and sister. Patient indicates he is pretty good at controlling his diet. Does not drink any sodas and limits his sweets. However patient does drink juice with most meals. Denies polyphagia/dipsia. Endorse polyuria. Visual problems- no blurry vision  # HYPERTENSION Well controlled previously. Patient does not check outside of clinic.  Chest pain, palpitations- none       Dyspnea- none  Medications: Prinzide without issues, takes this medication daily, no issues with compliance.  Lightheadedness,Syncope- no  Edema- no  BPH  - Still take the Proscar. Has helped only minimally.  Past Medical History Reviewed problem list.  Medications- reviewed and updated No additions to family history Social history- patient smokes THC, no tobacco  Objective: BP 124/80 (BP Location: Right Arm, Patient Position: Sitting, Cuff Size: Normal)   Pulse 64   Temp 98.4 F (36.9 C) (Oral)   Ht 5\' 11"  (1.803 m)   Wt 175 lb (79.4 kg)   BMI 24.41 kg/m  Gen: NAD, alert, cooperative with exam Cardio: regular rate and rhythm, S1S2 heard, no murmurs appreciated Pulm: clear to auscultation bilaterally, no wheezes, rhonchi or rales GU exam: Prostate examined, smooth, no lumps or bumps,  1 + in size   Assessment/Plan: See problem based a/p  Essential hypertension, benign Controlled.  - Recheck BMET due to elevated SCR, kidney protection with ACE already  - Continue Prinzide   Benign prostatic hyperplasia Continue Proscar, discussed that this medication should shrink prostate over time   Impaired fasting glucose Prediabetes: A1C  6.4, borderline for diabetes.  - Started patient on metformin 500 mg and then titrate up to 1000 mg  - Decrease juice intake  - Follow up in 3- 6 months

## 2015-10-10 NOTE — Patient Instructions (Addendum)
Prediabetic  - Going to check your blood sugar levels today  - Try to decrease your sugar intake as much as possible  Follow up in 3-6  months

## 2015-10-11 ENCOUNTER — Telehealth: Payer: Self-pay | Admitting: *Deleted

## 2015-10-11 LAB — BASIC METABOLIC PANEL WITH GFR
BUN: 19 mg/dL (ref 7–25)
CO2: 22 mmol/L (ref 20–31)
Calcium: 9.8 mg/dL (ref 8.6–10.3)
Chloride: 100 mmol/L (ref 98–110)
Creat: 1.34 mg/dL — ABNORMAL HIGH (ref 0.70–1.25)
GFR, Est African American: 63 mL/min (ref >=60)
GFR, Est Non African American: 55 mL/min — ABNORMAL LOW (ref >=60)
Glucose, Bld: 79 mg/dL (ref 65–99)
Potassium: 4.2 mmol/L (ref 3.5–5.3)
Sodium: 138 mmol/L (ref 135–146)

## 2015-10-11 MED ORDER — METFORMIN HCL 500 MG PO TABS: 500.0000 mg | ORAL_TABLET | ORAL | 3 refills | Status: DC

## 2015-10-11 NOTE — Assessment & Plan Note (Signed)
Prediabetes: A1C  6.4, borderline for diabetes.  - Started patient on metformin 500 mg and then titrate up to 1000 mg  - Decrease juice intake  - Follow up in 3- 6 months

## 2015-10-11 NOTE — Assessment & Plan Note (Signed)
Continue Proscar, discussed that this medication should shrink prostate over time

## 2015-10-11 NOTE — Telephone Encounter (Signed)
Received fax from [place] needing clarification for metformin 500 mg directions: Take 1 tablet (500 mg total) by mouth as directed. Take 1 tablet in the AM for 1 week and then 1 tablet in AM and 1 tablet in the PM.  Please send in new Rx with correct directions or call [phone].  Derl Barrow, RN

## 2015-10-11 NOTE — Assessment & Plan Note (Addendum)
Controlled.  - Recheck BMET due to elevated SCR, kidney protection with ACE already  - Continue Prinzide

## 2015-10-12 MED ORDER — METFORMIN HCL 500 MG PO TABS: ORAL_TABLET | ORAL | 3 refills | Status: DC

## 2015-10-12 NOTE — Telephone Encounter (Signed)
Resent prescription

## 2016-01-08 ENCOUNTER — Ambulatory Visit (HOSPITAL_COMMUNITY)
Admission: RE | Admit: 2016-01-08 | Discharge: 2016-01-08 | Disposition: A | Discharge status: Home / Self Care | Payer: Medicare Other | Source: Ambulatory Visit | Attending: Family Medicine | Admitting: Family Medicine

## 2016-01-08 ENCOUNTER — Encounter: Payer: Self-pay | Admitting: Family Medicine

## 2016-01-08 ENCOUNTER — Ambulatory Visit (INDEPENDENT_AMBULATORY_CARE_PROVIDER_SITE_OTHER): Payer: Medicare Other | Admitting: Family Medicine

## 2016-01-08 VITALS — BP 115/81 | HR 71 | Temp 97.7°F | Wt 168.0 lb

## 2016-01-08 DIAGNOSIS — R079 Chest pain, unspecified: Secondary | ICD-10-CM

## 2016-01-08 DIAGNOSIS — I10 Essential (primary) hypertension: Secondary | ICD-10-CM

## 2016-01-08 DIAGNOSIS — R0789 Other chest pain: Secondary | ICD-10-CM | POA: Diagnosis not present

## 2016-01-08 MED ORDER — LISINOPRIL-HYDROCHLOROTHIAZIDE 20-25 MG PO TABS: 1.0000 | ORAL_TABLET | Freq: Every day | ORAL | 6 refills | Status: DC

## 2016-01-08 NOTE — Assessment & Plan Note (Signed)
This is a 65 y/o male presenting with chest discomfort for the last week and a half that is intermittent and worsened by both alcohol, on awakening, and prolonged emulation. No shortness of breath concerning for pulmonary etiology. Wells score is 0 and patient is satting well on room air without any respiratory distress.  EKG was reassuring in clinic today. The patient had a stress test in November 2015 that was normal. He feels that his symptoms are similar to his symptoms in 2016.  Given that his symptoms worsen with exertion, I will refer back to cardiology as it is been almost 2 years since he last saw them. Additionally, this could be GI related given that it is worsened by alcohol consumption. I discussed importance of avoiding medications and foods that can exacerbate reflux such as alcohol, caffeine, and chocolate. Also discussed maintaining an upright position at least 30 minutes after eating. Patient has Prilosec on his medication list, however has not been taking it. - Referral to cardiology for possible stress test -As it has been one year, will repeat a chest x-ray -Discussed return precautions with the patient -Advised patient to restart Prilosec in addition to those behavioral modifications that we discussed.

## 2016-01-08 NOTE — Progress Notes (Signed)
Subjective: CC: chest pain HPI: Patient is a 65 y.o. male with a past medical history of HTN, prediabetes presenting to clinic today for a same day appt for .  Patient notes that every other morning he has "lancing pain" that radiates from the right side of his chest towards the left axilla.  No diaphoresis with chest pain.  He notes it occurs with drinking beer or taking shots and mildly bothers him with ambulation after about 20 minutes. He denies any other foods such as spicy food causing his symptoms.  No numbness or tingling. No dizziness or light headedness. No jaw pain. No SOB. No chest pain currently.  No foul taste in his mouth, hemoptysis, recent travel, recent surgeries, no LE edema  He notes that this pain is similar to back in 2016 when he was worked up for chest pain.   Social History:  Smokes THC, doesn't use tobacco.   ROS: All other systems reviewed and are negative.  Past Medical History Patient Active Problem List   Diagnosis Date Noted  . Prediabetes 10/10/2015  . Left shoulder pain 05/30/2015  . Discomfort in chest 01/26/2015  . Cataract, left eye 11/03/2014  . Gastritis 02/11/2014  . Impaired fasting glucose 10/19/2013  . Substance abuse 09/04/2011  . Erectile dysfunction 09/04/2011  . Hemoptysis 03/29/2011  . Colon cancer screening 03/29/2011  . Prostate cancer screening 03/29/2011  . Elevated lipids 04/04/2009  . Benign prostatic hyperplasia 04/04/2009  . NOCTURIA 01/05/2009  . Essential hypertension, benign 01/05/2009    Medications- reviewed and updated Current Outpatient Prescriptions  Medication Sig Dispense Refill  . aspirin 81 MG tablet Take 81 mg by mouth daily.    [name] atorvastatin (LIPITOR) 20 MG tablet Take 1 tablet (20 mg total) by mouth daily. 30 tablet 5  . finasteride (PROSCAR) 5 MG tablet Take 1 tablet (5 mg total) by mouth daily. 30 tablet 5  . lisinopril-hydrochlorothiazide (PRINZIDE,ZESTORETIC) 20-25 MG tablet Take 1 tablet by  mouth daily. 30 tablet 6  . metFORMIN (GLUCOPHAGE) 500 MG tablet Take 1 tablet in the AM for 1 week and then 1 tablet in AM and 1 tablet in the PM thereafter 60 tablet 3  . naproxen (NAPROSYN) 500 MG tablet Take 1 tablet (500 mg total) by mouth 2 (two) times daily with a meal. 14 tablet 0  . omeprazole (PRILOSEC) 40 MG capsule Take 1 capsule (40 mg total) by mouth daily. (Patient not taking: Reported on 01/26/2015) 30 capsule 3   No current facility-administered medications for this visit.     Objective: Office vital signs reviewed. BP 115/81   Pulse 71   Temp 97.7 F (36.5 C) (Oral)   Wt 168 lb (76.2 kg)   SpO2 98%   BMI 23.43 kg/m    Physical Examination:  General: Awake, alert, well- nourished, NAD Eyes: Conjunctiva non-injected.  Cardio: RRR, no m/r/g noted. No pitting edema. Pain not reproducible on palpation over the anterior chest wall.  Pulm: No increased WOB.  CTAB, without wheezes, rhonchi or crackles noted.  Extremities: no size discrepancy in calves.  MSK: Normal gait and station Skin: dry, intact, no rashes or lesions noted over anterior chest wall.   EKG:   Assessment/Plan: Discomfort in chest This is a 65 y/o male presenting with chest discomfort for the last week and a half that is intermittent and worsened by both alcohol, on awakening, and prolonged emulation. No shortness of breath concerning for pulmonary etiology. Wells score is 0 and patient  is satting well on room air without any respiratory distress.  EKG was reassuring in clinic today. The patient had a stress test in November 2015 that was normal. He feels that his symptoms are similar to his symptoms in 2016.  Given that his symptoms worsen with exertion, I will refer back to cardiology as it is been almost 2 years since he last saw them. Additionally, this could be GI related given that it is worsened by alcohol consumption. I discussed importance of avoiding medications and foods that can exacerbate reflux  such as alcohol, caffeine, and chocolate. Also discussed maintaining an upright position at least 30 minutes after eating. Patient has Prilosec on his medication list, however has not been taking it. - Referral to cardiology for possible stress test -As it has been one year, will repeat a chest x-ray -Discussed return precautions with the patient -Advised patient to restart Prilosec in addition to those behavioral modifications that we discussed.  Discussed with attending, Dr. Ree Kida  Orders Placed This Encounter  Procedures  . DG Chest 2 View    Standing Status:   Future    Standing Expiration Date:   03/09/2017    Order Specific Question:   Reason for Exam (SYMPTOM  OR DIAGNOSIS REQUIRED)    Answer:   chest pain intermittently    Order Specific Question:   Preferred imaging location?    Answer:   Internal  . Ambulatory referral to Cardiology    Referral Priority:   Routine    Referral Type:   Consultation    Referral Reason:   Specialty Services Required    Requested Specialty:   Cardiology    Number of Visits Requested:   1  . EKG 12-Lead    Meds ordered this encounter  Medications  . lisinopril-hydrochlorothiazide (PRINZIDE,ZESTORETIC) 20-25 MG tablet    Sig: Take 1 tablet by mouth daily.    Dispense:  30 tablet    Refill:  [facility] PGY-3, [place]

## 2016-01-08 NOTE — Patient Instructions (Signed)
It was nice to meet you. I did not think that your chest pain is anything dangerous at this time given its intermittent nature, however I do feel you should follow-up with cardiology since it has been almost 2 years since you saw him last.  I would like to get a repeat chest x-ray just to ensure that there is no issues with urinary lung.  I refilled your blood pressure medication.  If you chest pain worsens, changes in nature, associated with numbness or tingling of the arms or jaw, profuse sweating, or shortness of breath please seek urgent care.   Nonspecific Chest Pain  Chest pain can be caused by many different conditions. There is always a chance that your pain could be related to something serious, such as a heart attack or a blood clot in your lungs. Chest pain can also be caused by conditions that are not life-threatening. If you have chest pain, it is very important to follow up with your health care provider. CAUSES  Chest pain can be caused by:  Heartburn.  Pneumonia or bronchitis.  Anxiety or stress.  Inflammation around your heart (pericarditis) or lung (pleuritis or pleurisy).  A blood clot in your lung.  A collapsed lung (pneumothorax). It can develop suddenly on its own (spontaneous pneumothorax) or from trauma to the chest.  Shingles infection (varicella-zoster virus).  Heart attack.  Damage to the bones, muscles, and cartilage that make up your chest wall. This can include:  Bruised bones due to injury.  Strained muscles or cartilage due to frequent or repeated coughing or overwork.  Fracture to one or more ribs.  Sore cartilage due to inflammation (costochondritis). RISK FACTORS  Risk factors for chest pain may include:  Activities that increase your risk for trauma or injury to your chest.  Respiratory infections or conditions that cause frequent coughing.  Medical conditions or overeating that can cause heartburn.  Heart disease or family history  of heart disease.  Conditions or health behaviors that increase your risk of developing a blood clot.  Having had chicken pox (varicella zoster). SIGNS AND SYMPTOMS Chest pain can feel like:  Burning or tingling on the surface of your chest or deep in your chest.  Crushing, pressure, aching, or squeezing pain.  Dull or sharp pain that is worse when you move, cough, or take a deep breath.  Pain that is also felt in your back, neck, shoulder, or arm, or pain that spreads to any of these areas. Your chest pain may come and go, or it may stay constant. DIAGNOSIS Lab tests or other studies may be needed to find the cause of your pain. Your health care provider may have you take a test called an ambulatory ECG (electrocardiogram). An ECG records your heartbeat patterns at the time the test is performed. You may also have other tests, such as:  Transthoracic echocardiogram (TTE). During echocardiography, sound waves are used to create a picture of all of the heart structures and to look at how blood flows through your heart.  Transesophageal echocardiogram (TEE).This is a more advanced imaging test that obtains images from inside your body. It allows your health care provider to see your heart in finer detail.  Cardiac monitoring. This allows your health care provider to monitor your heart rate and rhythm in real time.  Holter monitor. This is a portable device that records your heartbeat and can help to diagnose abnormal heartbeats. It allows your health care provider to track your heart activity  for several days, if needed.  Stress tests. These can be done through exercise or by taking medicine that makes your heart beat more quickly.  Blood tests.  Imaging tests. TREATMENT  Your treatment depends on what is causing your chest pain. Treatment may include:  Medicines. These may include:  Acid blockers for heartburn.  Anti-inflammatory medicine.  Pain medicine for inflammatory  conditions.  Antibiotic medicine, if an infection is present.  Medicines to dissolve blood clots.  Medicines to treat coronary artery disease.  Supportive care for conditions that do not require medicines. This may include:  Resting.  Applying heat or cold packs to injured areas.  Limiting activities until pain decreases. HOME CARE INSTRUCTIONS  If you were prescribed an antibiotic medicine, finish it all even if you start to feel better.  Avoid any activities that bring on chest pain.  Do not use any tobacco products, including cigarettes, chewing tobacco, or electronic cigarettes. If you need help quitting, ask your health care provider.  Do not drink alcohol.  Take medicines only as directed by your health care provider.  Keep all follow-up visits as directed by your health care provider. This is important. This includes any further testing if your chest pain does not go away.  If heartburn is the cause for your chest pain, you may be told to keep your head raised (elevated) while sleeping. This reduces the chance that acid will go from your stomach into your esophagus.  Make lifestyle changes as directed by your health care provider. These may include:  Getting regular exercise. Ask your health care provider to suggest some activities that are safe for you.  Eating a heart-healthy diet. A registered dietitian can help you to learn healthy eating options.  Maintaining a healthy weight.  Managing diabetes, if necessary.  Reducing stress. SEEK MEDICAL CARE IF:  Your chest pain does not go away after treatment.  You have a rash with blisters on your chest.  You have a fever. SEEK IMMEDIATE MEDICAL CARE IF:   Your chest pain is worse.  You have an increasing cough, or you cough up blood.  You have severe abdominal pain.  You have severe weakness.  You faint.  You have chills.  You have sudden, unexplained chest discomfort.  You have sudden, unexplained  discomfort in your arms, back, neck, or jaw.  You have shortness of breath at any time.  You suddenly start to sweat, or your skin gets clammy.  You feel nauseous or you vomit.  You suddenly feel light-headed or dizzy.  Your heart begins to beat quickly, or it feels like it is skipping beats. These symptoms may represent a serious problem that is an emergency. Do not wait to see if the symptoms will go away. Get medical help right away. Call your local emergency services (911 in the [place] Do not drive yourself to the hospital.   This information is not intended to replace advice given to you by your health care provider. Make sure you discuss any questions you have with your health care provider.   Document Released: 11/21/2004 Document Revised: 03/04/2014 Document Reviewed: 09/17/2013 Elsevier Interactive Patient Education [employer].

## 2016-01-11 ENCOUNTER — Ambulatory Visit: Payer: Medicare Other | Admitting: Cardiovascular Disease

## 2016-01-12 ENCOUNTER — Encounter: Payer: Self-pay | Admitting: *Deleted

## 2016-02-01 ENCOUNTER — Encounter: Payer: Self-pay | Admitting: Cardiology

## 2016-02-01 ENCOUNTER — Ambulatory Visit (INDEPENDENT_AMBULATORY_CARE_PROVIDER_SITE_OTHER): Payer: Medicare Other | Admitting: Cardiology

## 2016-02-01 VITALS — BP 126/82 | HR 68 | Ht 71.0 in | Wt 169.6 lb

## 2016-02-01 DIAGNOSIS — R079 Chest pain, unspecified: Secondary | ICD-10-CM | POA: Diagnosis not present

## 2016-02-01 DIAGNOSIS — R0609 Other forms of dyspnea: Secondary | ICD-10-CM

## 2016-02-01 DIAGNOSIS — I10 Essential (primary) hypertension: Secondary | ICD-10-CM | POA: Diagnosis not present

## 2016-02-01 DIAGNOSIS — R0600 Dyspnea, unspecified: Secondary | ICD-10-CM

## 2016-02-01 DIAGNOSIS — E785 Hyperlipidemia, unspecified: Secondary | ICD-10-CM

## 2016-02-01 NOTE — Patient Instructions (Signed)
Schedule at [facility] has requested that you have a stress echocardiogram. For further information please visit [email or web address]. Please follow instruction sheet as given.    Your physician recommends that you schedule a follow-up appointment in  1 MONTH WITH DR HARDING - F/U TEST     Exercise Stress Echocardiogram An exercise stress echocardiogram is a test that checks how well your heart is working. For this test, you will walk on a treadmill to make your heart beat faster. This test uses sound waves (ultrasound) and a computer to make pictures (images) of your heart. These pictures will be taken before you exercise and after you exercise. What happens before the procedure?  Follow instructions from your doctor about what you cannot eat or drink before the test.  Do not drink or eat anything that has caffeine in it. Stop having caffeine for 24 hours before the test.  Ask your doctor about changing or stopping your normal medicines. This is important if you take diabetes medicines or blood thinners. Ask your doctor if you should take your medicines with water before the test.  If you use an inhaler, bring it to the test.  Do not use any products that have nicotine or tobacco in them, such as cigarettes and e-cigarettes. Stop using them for 4 hours before the test. If you need help quitting, ask your doctor.  Wear comfortable shoes and clothing. What happens during the procedure?  You will be hooked up to a TV screen. Your doctor will watch the screen to see how fast your heart beats during the test.  Before you exercise, a computer will make a picture of your heart. To do this:  A gel will be put on your chest.  A wand will be moved over the gel.  Sound waves from the wand will go to the computer to make the picture.  Your will start walking on a treadmill. The treadmill will start at a slow speed. It will get faster a little bit at a  time. When you walk faster, your heart will beat faster.  The treadmill will be stopped when your heart is working hard.  You will lie down right away so another picture of your heart can be taken.  The test will take 30-60 minutes. What happens after the procedure?  Your heart rate and blood pressure will be watched after the test.  If your doctor says that you can, you may:  Eat what you usually eat.  Do your normal activities.  Take medicines like normal. Summary  An exercise stress echocardiogram is a test that checks how well your heart is working.  Follow instructions about what you cannot eat or drink before the test. Ask your doctor if you should take your normal medicines before the test.  Stop having caffeine for 24 hours before the test. Do not use anything with nicotine or tobacco in it for 4 hours before the test.  A computer will take a picture of your heart before you walk on a treadmill. It will take another picture when you are done walking.  Your heart rate and blood pressure will be watched after the test. This information is not intended to replace advice given to you by your health care provider. Make sure you discuss any questions you have with your health care provider. Document Released: 12/09/2008 Document Revised: 11/05/2015 Document Reviewed: 11/05/2015 Elsevier Interactive Patient Education  2017 [employer].

## 2016-02-01 NOTE — Progress Notes (Signed)
PCP: Chrisandra Netters, MD  Clinic Note: Chief Complaint  Patient presents with  . New Patient (Initial Visit)  . Chest Pain    pt states it feels like an lancing pain but not sharp. it happens sometimes when drinking something cold and sometimes when getting up in the morning     HPI: Adam Burton is a 65 y.o. male with a PMH below who presents today for cardiology evaluation with a presentation to his family doctor recently for chest pain back on November 13. He has a history of hypertension, and prediabetes. He smokes marijuana, but not cigarettes.  Adam Burton was last seen by his PCP on November 13. He noted every other morning and episodes of "lancing pain" radiating from the left side of his chest toward the axilla. Not associated with diaphoresis. Usually noted with drinking beer or taking shots and maybe bothers him when he walks after about 20 minutes. Not associated with eating anything in particular. Not associated with dyspnea or radiation to the jaw or arm.  Recent Hospitalizations: None  Studies Reviewed:  Stress Echocardiogram November 2015: He exercised for just over 4 minutes. Heart rate increased to 148 bpm (95% of MPHR-156 bpm). Rate pressure product 31968 mmHg/min). Hypertensive response Normal baseline EF of 60%. Normal wall motion. No regional abnormalities. There is normal increase in thickening contractility in all segments with stress. NORMAL STUDY.  2-D Echocardiogram 12/02/2013: EF 55-60%. No regional wall motion modalities. Normal thickness. Mild LA dilation  Myoview Stress Test November 2010 (from [facility]): EF 60%. Good exercise capacity. Hypertensive response. No chest pain. No ST segment changes. All area of mild ischemia in the basal inferoseptal wall/apex. Otherwise normal.  Cardiac Catheterization December 2010 (Dr. Martinique): Normal coronary anatomy. Normal LV function  Interval History: Also is a very pleasant gentleman who has some  unusual symptoms of sharp "lancing like chest pain that he feels from the left side of his chest wall across to his left shoulder. This is not at all associated with any exertion or exercise, but he notes lying certain way in bed can make it worse. He also notes thickening made worse by drinking. Sometimes she'll most of exertional dyspnea and a different type chest discomfort with walking.  He may feel a little dizzy or lightheaded when his blood pressure is high me feel some occasional increase heartbeats but nothing that would lead to syncope or near syncope. No real symptomatic opticians. When he notes is that if he walks for longer distance and is doing anything else such as talking or carrying something, he will get short of breath and maybe noticed some discomfort in his chest - this is more of a tightness than the sharp lancing pain he feels otherwise.[name]   No PND, orthopnea or edema.  No syncope/near syncope, or TIA/amaurosis fugax symptoms. No melena, hematochezia, hematuria, or epstaxis. No claudication.  ROS: A comprehensive was performed. Review of Systems  Constitutional: Negative.   HENT: Negative.  Negative for nosebleeds.   Cardiovascular: Positive for palpitations. Negative for leg swelling.  Gastrointestinal: Positive for heartburn. Negative for blood in stool and melena.  Genitourinary: Negative for hematuria.  Musculoskeletal: Negative.   Neurological: Positive for dizziness.  Psychiatric/Behavioral: Negative.   All other systems reviewed and are negative.    Past Medical History:  Diagnosis Date  . Hypercholesteremia   . Hypertension   . Renal insufficiency 08/21/2012    Past Surgical History:  Procedure Laterality Date  . CARDIAC CATHETERIZATION  01/2009  Dr. Martinique. Normal coronary arteries. Normal LV function.  [name] HERNIA REPAIR    . NM MYOVIEW LTD  12/2008   Potential small area of mild ischemia in the basal inferoseptal wall. Otherwise normal. --> False  positive test based on coronary angiography  . PROSTATE SURGERY    . Stress Echocardiogram  12/2013   Exercise for just over 4 minutes. Heart rate increased to 148 BPM. Hypertensive response to exercise. EF 60%. No evidence of ischemia. NORMAL STUDY  . TRANSTHORACIC ECHOCARDIOGRAM  11/2013   EF 55-60%. No regional wall motion abnormality is. Normal thickness. Mild LA dilation.    Current Meds  Medication Sig  . aspirin 81 MG tablet Take 81 mg by mouth daily.  [name] atorvastatin (LIPITOR) 20 MG tablet Take 1 tablet (20 mg total) by mouth daily.  . finasteride (PROSCAR) 5 MG tablet Take 1 tablet (5 mg total) by mouth daily.  [name] lisinopril-hydrochlorothiazide (PRINZIDE,ZESTORETIC) 20-25 MG tablet Take 1 tablet by mouth daily.    No Known Allergies  Social History   Social History  . Marital status: Widowed    Spouse name: N/A  . Number of children: N/A  . Years of education: N/A   Social History Main Topics  . Smoking status: Never Smoker  . Smokeless tobacco: Never Used  . Alcohol use Yes     Comment: beer occasionally  . Drug use:     Types: Marijuana  . Sexual activity: Not Asked   Other Topics Concern  . None   Social History Narrative   He is a widowed father of 75, grandfather of 3. He lives with his current girlfriend. 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    family history includes Diabetes in his maternal grandmother, mother, and sister; Heart attack in his maternal grandmother and mother.  Wt Readings from Last 3 Encounters:  02/01/16 76.9 kg (169 lb 9.6 oz)  01/08/16 76.2 kg (168 lb)  10/10/15 79.4 kg (175 lb)    PHYSICAL EXAM BP 126/82   Pulse 68   Ht 5\' 11"  (1.803 m)   Wt 76.9 kg (169 lb 9.6 oz)   SpO2 97%   BMI 23.65 kg/m  General appearance: alert,  cooperative, appears stated age, no distress and healthy appearing.  Well nourished & well groomed. HEENT: [place]/AT, EOMI, MMM, anicteric sclera Neck: no adenopathy, no carotid bruit and no JVD Lungs: clear to auscultation bilaterally, normal percussion bilaterally and non-labored Heart: regular rate and rhythm, S1, S2 normal, no murmur, click, rub or gallop; non-displaced PMI Abdomen: soft, non-tender; bowel sounds normal; no masses,  no organomegaly; no HJR Extremities: extremities normal, atraumatic, no cyanosis, or edema  Pulses: 2+ and symmetric;  Skin: mobility and turgor normal or  Neurologic: Mental status: Alert, oriented, thought content appropriate Cranial nerves: normal (II-XII grossly intact)    Adult ECG Report Not checked  Other studies Reviewed: Additional studies/ records that were reviewed today include:  Recent Labs:   Lab Results  Component Value Date   CHOL 225 (H) 11/01/2014   HDL 38 (L) 11/01/2014   LDLCALC 164 (H) 11/01/2014   LDLDIRECT 84 09/03/2011   TRIG 113 11/01/2014   CHOLHDL 5.9 (H) 11/01/2014     ASSESSMENT / PLAN: Problem List Items Addressed This Visit  Hyperlipidemia with target low density lipoprotein (LDL) cholesterol less than 100 mg/dL (Chronic)    Last check was back in 2016. He is now on atorvastatin 20 mg daily. No myalgias. Depending on how his stress test shows, we may be more aggressive and recommend further titration. For now continue to monitor.      Essential hypertension, benign (Chronic)    Well-controlled on current regimen. Currently taking an ACE inhibitor-HCTZ with good control.      DOE (dyspnea on exertion)    Only related to deconditioning because he has not been doing as much walking as he usually does. But he still is living at pretty good amount of walking before he gets short of breath. Because some of it is associated with some discomfort in his chest, but the exclude ischemia. Plan: Treadmill Stress  Echocardiogram      Relevant Orders   ECHOCARDIOGRAM STRESS TEST   Chest pain with moderate risk for cardiac etiology - Primary    ES 2 different types chest pain. One is that lancing type chest pain which does not sound at all cardiectomy it sounds more musculoskeletal or even potential esophageal spasm. If the different than the chest discomfort that he notes with some exertional dyspnea that makes a little more concerned in a patient with hyperlipidemia and hypertension that he may potentially be having ischemic coronary disease.  He had a pretty good result on his last treadmill echocardiogram, but had an essentially false positive nuclear stress test in the past. Plan: Recheck treadmill echocardiogram to exclude ischemic CAD      Relevant Orders   ECHOCARDIOGRAM STRESS TEST      Current medicines are reviewed at length with the patient today. (+/- concerns) none The following changes have been made: none  Patient Instructions  Schedule at [facility] has requested that you have a stress echocardiogram. For further information please visit [email or web address]. Please follow instruction sheet as given.    Your physician recommends that you schedule a follow-up appointment in  1 MONTH WITH DR Lott Seelbach - F/U TEST     Exercise Stress Echocardiogram An exercise stress echocardiogram is a test that checks how well your heart is working. For this test, you will walk on a treadmill to make your heart beat faster. This test uses sound waves (ultrasound) and a computer to make pictures (images) of your heart. These pictures will be taken before you exercise and after you exercise. What happens before the procedure?  Follow instructions from your doctor about what you cannot eat or drink before the test.  Do not drink or eat anything that has caffeine in it. Stop having caffeine for 24 hours before the test.  Ask your doctor about changing or  stopping your normal medicines. This is important if you take diabetes medicines or blood thinners. Ask your doctor if you should take your medicines with water before the test.  If you use an inhaler, bring it to the test.  Do not use any products that have nicotine or tobacco in them, such as cigarettes and e-cigarettes. Stop using them for 4 hours before the test. If you need help quitting, ask your doctor.  Wear comfortable shoes and clothing. What happens during the procedure?  You will be hooked up to a TV screen. Your doctor will watch the screen to see how fast your heart beats during the test.  Before you exercise, a computer will make a picture of your  heart. To do this:  A gel will be put on your chest.  A wand will be moved over the gel.  Sound waves from the wand will go to the computer to make the picture.  Your will start walking on a treadmill. The treadmill will start at a slow speed. It will get faster a little bit at a time. When you walk faster, your heart will beat faster.  The treadmill will be stopped when your heart is working hard.  You will lie down right away so another picture of your heart can be taken.  The test will take 30-60 minutes. What happens after the procedure?  Your heart rate and blood pressure will be watched after the test.  If your doctor says that you can, you may:  Eat what you usually eat.  Do your normal activities.  Take medicines like normal. Summary  An exercise stress echocardiogram is a test that checks how well your heart is working.  Follow instructions about what you cannot eat or drink before the test. Ask your doctor if you should take your normal medicines before the test.  Stop having caffeine for 24 hours before the test. Do not use anything with nicotine or tobacco in it for 4 hours before the test.  A computer will take a picture of your heart before you walk on a treadmill. It will take another picture when  you are done walking.  Your heart rate and blood pressure will be watched after the test. This information is not intended to replace advice given to you by your health care provider. Make sure you discuss any questions you have with your health care provider. Document Released: 12/09/2008 Document Revised: 11/05/2015 Document Reviewed: 11/05/2015 Elsevier Interactive Patient Education  2017 [employer].     Studies Ordered:   Orders Placed This Encounter  Procedures  . ECHOCARDIOGRAM STRESS TEST      Glenetta Hew, M.D., M.S. Interventional Cardiologist   Pager # [phone] Phone # [phone] [address] [place], [place] [address]

## 2016-02-03 ENCOUNTER — Encounter: Payer: Self-pay | Admitting: Cardiology

## 2016-02-03 NOTE — Assessment & Plan Note (Signed)
Well-controlled on current regimen. Currently taking an ACE inhibitor-HCTZ with good control.

## 2016-02-03 NOTE — Assessment & Plan Note (Signed)
ES 2 different types chest pain. One is that lancing type chest pain which does not sound at all cardiectomy it sounds more musculoskeletal or even potential esophageal spasm. If the different than the chest discomfort that he notes with some exertional dyspnea that makes a little more concerned in a patient with hyperlipidemia and hypertension that he may potentially be having ischemic coronary disease.  He had a pretty good result on his last treadmill echocardiogram, but had an essentially false positive nuclear stress test in the past. Plan: Recheck treadmill echocardiogram to exclude ischemic CAD

## 2016-02-03 NOTE — Assessment & Plan Note (Signed)
Last check was back in 2016. He is now on atorvastatin 20 mg daily. No myalgias. Depending on how his stress test shows, we may be more aggressive and recommend further titration. For now continue to monitor.

## 2016-02-03 NOTE — Assessment & Plan Note (Signed)
Only related to deconditioning because he has not been doing as much walking as he usually does. But he still is living at pretty good amount of walking before he gets short of breath. Because some of it is associated with some discomfort in his chest, but the exclude ischemia. Plan: Treadmill Stress Echocardiogram

## 2016-02-29 ENCOUNTER — Telehealth (HOSPITAL_COMMUNITY): Payer: Self-pay | Admitting: *Deleted

## 2016-02-29 NOTE — Telephone Encounter (Signed)
Patient given detailed instructions per Stress Test Requisition Sheet for test on 03/04/16 at 2:30.Patient Notified to arrive 30 minutes early, and that it is imperative to arrive on time for appointment to keep from having the test rescheduled.  Patient verbalized understanding. Adam Burton

## 2016-03-04 ENCOUNTER — Ambulatory Visit (HOSPITAL_COMMUNITY): Payer: Medicare Other | Attending: Cardiology

## 2016-03-04 ENCOUNTER — Ambulatory Visit (INDEPENDENT_AMBULATORY_CARE_PROVIDER_SITE_OTHER): Payer: Medicare Other

## 2016-03-04 DIAGNOSIS — R0609 Other forms of dyspnea: Secondary | ICD-10-CM | POA: Diagnosis not present

## 2016-03-04 DIAGNOSIS — R0600 Dyspnea, unspecified: Secondary | ICD-10-CM

## 2016-03-04 DIAGNOSIS — R079 Chest pain, unspecified: Secondary | ICD-10-CM | POA: Diagnosis not present

## 2016-03-04 DIAGNOSIS — R0989 Other specified symptoms and signs involving the circulatory and respiratory systems: Secondary | ICD-10-CM

## 2016-04-04 ENCOUNTER — Encounter: Payer: Self-pay | Admitting: Cardiology

## 2016-04-04 ENCOUNTER — Ambulatory Visit (INDEPENDENT_AMBULATORY_CARE_PROVIDER_SITE_OTHER): Payer: Medicare Other | Admitting: Cardiology

## 2016-04-04 VITALS — BP 135/88 | HR 70 | Ht 71.0 in | Wt 169.4 lb

## 2016-04-04 DIAGNOSIS — I10 Essential (primary) hypertension: Secondary | ICD-10-CM | POA: Diagnosis not present

## 2016-04-04 DIAGNOSIS — E785 Hyperlipidemia, unspecified: Secondary | ICD-10-CM | POA: Diagnosis not present

## 2016-04-04 DIAGNOSIS — R079 Chest pain, unspecified: Secondary | ICD-10-CM | POA: Diagnosis not present

## 2016-04-04 NOTE — Progress Notes (Signed)
PCP: Chrisandra Netters, MD  Clinic Note: Chief Complaint  Patient presents with  . Follow-up    after stress echo to evaluate chest pain.   - No furher CP    HPI: Adam Burton is a 66 y.o. male with a PMH below who presents today for follow-up cardiology evaluation with a presentation to his family doctor recently for chest pain back on November 13. He has a history of hypertension, and prediabetes. He smokes marijuana, but not cigarettes.  Adam Burton was last seen inf referral from his PCP on Feb 01 2016 for evaluation of chest pain.[name] He noted every other morning and episodes of "lancing pain" radiating from the left side of his chest toward the axilla. Not associated with diaphoresis. Usually noted with drinking beer or taking shots and maybe bothers him when he walks after about 20 minutes. Not associated with eating anything in particular. Not associated with dyspnea or radiation to the jaw or arm. -- he had a follow-up stress echo that was read as normal (see below)  Recent Hospitalizations: None  Studies Reviewed:  Stress echo 03/04/2016: Normal ejection fraction. No EKG changes.  Good exercise tolerance. Mild hypertensive response to exercise. No evidence of ischemia on stress echo.  Interval History: Adam Burton is doing well today. He really notes that the chest discomfort symptoms seemed to have gone away.  What he describes now is occasionally having food get stuck in his throat that leads to some throat-chest discomfort.  This is usually alleviated by drinking some water or juice.  No further lancing chest pain & no symptoms with exertion of chest pain or dyspnea - although he does not being somewhat deconditioned & probably cannot do the level of activity that he once was able to do.  He may feel a little dizzy or lightheaded when his blood pressure is high me feel some occasional increase heartbeats but nothing that would lead to syncope or near syncope. No real symptomatic  opticians.  No PND, orthopnea or edema.  No syncope/near syncope, or TIA/amaurosis fugax symptoms. No melena, hematochezia, hematuria, or epstaxis. No claudication.  ROS: A comprehensive was performed. Review of Systems  Constitutional: Negative.   HENT: Negative.  Negative for nosebleeds.   Cardiovascular: Negative for palpitations (resolved) and leg swelling.  Gastrointestinal: Positive for heartburn. Negative for blood in stool and melena.       Sounds like mild dysphagia - food "slows down" going down - better after drinking.  Genitourinary: Negative for hematuria.  Musculoskeletal: Negative.   Neurological: Positive for dizziness.  Psychiatric/Behavioral: Negative.   All other systems reviewed and are negative.   Past Medical History:  Diagnosis Date  . Hypercholesteremia   . Hypertension   . Renal insufficiency 08/21/2012    Past Surgical History:  Procedure Laterality Date  . CARDIAC CATHETERIZATION  01/2009   Dr. Martinique. Normal coronary arteries. Normal LV function.  [name] HERNIA REPAIR    . NM MYOVIEW LTD  12/2008   Potential small area of mild ischemia in the basal inferoseptal wall. Otherwise normal. --> False positive test based on coronary angiography  . PROSTATE SURGERY    . Stress Echocardiogram  12/2013   Exercise for just over 4 minutes. Heart rate increased to 148 BPM. Hypertensive response to exercise. EF 60%. No evidence of ischemia. NORMAL STUDY  . TRANSTHORACIC ECHOCARDIOGRAM  11/2013   EF 55-60%. No regional wall motion abnormality is. Normal thickness. Mild LA dilation.  Stress Echocardiogram November 2015: He exercised for  just over 4 minutes. Heart rate increased to 148 bpm (95% of MPHR-156 bpm). Rate pressure product 31968 mmHg/min). Hypertensive response Normal baseline EF of 60%. Normal wall motion. No regional abnormalities. There is normal increase in thickening contractility in all segments with stress. NORMAL STUDY.  2-D Echocardiogram 12/02/2013:  EF 55-60%. No regional wall motion modalities. Normal thickness. Mild LA dilation  Myoview Stress Test November 2010 (from [facility]): EF 60%. Good exercise capacity. Hypertensive response. No chest pain. No ST segment changes. All area of mild ischemia in the basal inferoseptal wall/apex. Otherwise normal. Cardiac Catheterization December 2010 (Dr. Martinique): Normal coronary anatomy. Normal LV function  Current Meds  Medication Sig  . aspirin 81 MG tablet Take 81 mg by mouth daily.  [name] atorvastatin (LIPITOR) 20 MG tablet Take 1 tablet (20 mg total) by mouth daily.  . finasteride (PROSCAR) 5 MG tablet Take 1 tablet (5 mg total) by mouth daily.  [name] lisinopril-hydrochlorothiazide (PRINZIDE,ZESTORETIC) 20-25 MG tablet Take 1 tablet by mouth daily.    No Known Allergies  Social History   Social History  . Marital status: Widowed    Spouse name: N/A  . Number of children: N/A  . Years of education: N/A   Social History Main Topics  . Smoking status: Never Smoker  . Smokeless tobacco: Never Used  . Alcohol use Yes     Comment: beer occasionally  . Drug use: Yes    Types: Marijuana  . Sexual activity: Not Asked   Other Topics Concern  . None   Social History Narrative   He is a widowed father of 44, grandfather of 3. He lives with his current girlfriend. 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    family history includes Diabetes in his maternal grandmother, mother, and sister; Heart attack in his maternal grandmother and mother.  Wt Readings from Last 3 Encounters:  04/04/16 76.8 kg (169 lb 6.4 oz)  02/01/16 76.9 kg (169 lb 9.6 oz)  01/08/16 76.2 kg (168 lb)    PHYSICAL EXAM BP 135/88   Pulse 70   Ht 5\' 11"  (1.803 m)   Wt 76.8 kg (169 lb 6.4 oz)   BMI 23.63 kg/m    General appearance: alert, cooperative, appears stated age, no distress and healthy appearing.  Well nourished & well groomed. HEENT: [place]/AT, EOMI, MMM, anicteric sclera Neck: no adenopathy, no carotid bruit and no JVD Lungs: clear to auscultation bilaterally, normal percussion bilaterally and non-labored Heart: regular rate and rhythm, S1, S2 normal, no murmur, click, rub or gallop; non-displaced PMI Abdomen: soft, non-tender; bowel sounds normal; no masses,  no organomegaly; no HJR Extremities: extremities normal, atraumatic, no cyanosis, or edema  Pulses: 2+ and symmetric;  Neurologic: Mental status: Alert, oriented, thought content appropriate    Adult ECG Report Not checked  Other studies Reviewed: Additional studies/ records that were reviewed today include:  Recent Labs:   Lab Results  Component Value Date   CHOL 225 (H) 11/01/2014   HDL 38 (L) 11/01/2014   LDLCALC 164 (H) 11/01/2014   LDLDIRECT 84 09/03/2011   TRIG 113 11/01/2014   CHOLHDL 5.9 (H) 11/01/2014     ASSESSMENT / PLAN: Problem List Items Addressed This Visit    Hyperlipidemia with target low density lipoprotein (LDL) cholesterol  less than 100 mg/dL (Chronic)    Currently on atorvastatin. Apparently labs to be followed by PCP. Was not really a goal back in 2016.      Chest pain with low risk for cardiac etiology - Primary (Chronic)    He did have mostly atypical features for his chest pain but did have risk factors. He had again another normal treadmill echocardiogram arguing against anginal chest pain. Also the fact he is not had any more significant symptoms is also reassuring. Continue to encourage maintaining his exercise and treating cardiac risk factors.      Essential hypertension, benign (Chronic)    Fairly well controlled on current regimen. Continue current regimen.         Current medicines are reviewed at length with the patient today. (+/- concerns) none The following changes have been  made: none  Patient Instructions  No medications changes.   Your physician wants you to follow-up in: 1 YEAR with Dr. Ellyn Hack. You will receive a reminder letter in the mail two months in advance. If you don't receive a letter, please call our office to schedule the follow-up appointment.    If you need a refill on your cardiac medications before your next appointment, please call your pharmacy.    Studies Ordered:   No orders of the defined types were placed in this encounter.     Glenetta Hew, M.D., M.S. Interventional Cardiologist   Pager # [phone] Phone # [phone] [address] [place], [place] [address]

## 2016-04-04 NOTE — Patient Instructions (Addendum)
No medications changes.   Your physician wants you to follow-up in: 1 YEAR with Dr. Ellyn Hack. You will receive a reminder letter in the mail two months in advance. If you don't receive a letter, please call our office to schedule the follow-up appointment.    If you need a refill on your cardiac medications before your next appointment, please call your pharmacy.

## 2016-04-06 ENCOUNTER — Encounter: Payer: Self-pay | Admitting: Cardiology

## 2016-04-06 NOTE — Assessment & Plan Note (Signed)
Currently on atorvastatin. Apparently labs to be followed by PCP. Was not really a goal back in 2016.

## 2016-04-06 NOTE — Assessment & Plan Note (Addendum)
Fairly well controlled on current regimen. Continue current regimen.

## 2016-04-06 NOTE — Assessment & Plan Note (Signed)
He did have mostly atypical features for his chest pain but did have risk factors. He had again another normal treadmill echocardiogram arguing against anginal chest pain. Also the fact he is not had any more significant symptoms is also reassuring. Continue to encourage maintaining his exercise and treating cardiac risk factors.

## 2016-06-12 ENCOUNTER — Encounter: Payer: Self-pay | Admitting: *Deleted

## 2016-06-12 ENCOUNTER — Other Ambulatory Visit: Payer: Self-pay | Admitting: *Deleted

## 2016-06-12 ENCOUNTER — Ambulatory Visit (INDEPENDENT_AMBULATORY_CARE_PROVIDER_SITE_OTHER): Payer: Medicare Other | Admitting: *Deleted

## 2016-06-12 VITALS — BP 124/80 | HR 60 | Temp 98.5°F | Ht 71.0 in | Wt 165.2 lb

## 2016-06-12 DIAGNOSIS — Z23 Encounter for immunization: Secondary | ICD-10-CM | POA: Diagnosis not present

## 2016-06-12 DIAGNOSIS — Z0000 Encounter for general adult medical examination without abnormal findings: Secondary | ICD-10-CM | POA: Diagnosis not present

## 2016-06-12 DIAGNOSIS — Z1159 Encounter for screening for other viral diseases: Secondary | ICD-10-CM | POA: Diagnosis not present

## 2016-06-12 DIAGNOSIS — R7303 Prediabetes: Secondary | ICD-10-CM | POA: Diagnosis not present

## 2016-06-12 DIAGNOSIS — Z1211 Encounter for screening for malignant neoplasm of colon: Secondary | ICD-10-CM

## 2016-06-12 LAB — POCT GLYCOSYLATED HEMOGLOBIN (HGB A1C): Hemoglobin A1C: 6

## 2016-06-12 NOTE — Patient Instructions (Signed)
Fall Prevention in the Home Falls can cause injuries. They can happen to people of all ages. There are many things you can do to make your home safe and to help prevent falls. What can I do on the outside of my home?  Regularly fix the edges of walkways and driveways and fix any cracks.  Remove anything that might make you trip as you walk through a door, such as a raised step or threshold.  Trim any bushes or trees on the path to your home.  Use bright outdoor lighting.  Clear any walking paths of anything that might make someone trip, such as rocks or tools.  Regularly check to see if handrails are loose or broken. Make sure that both sides of any steps have handrails.  Any raised decks and porches should have guardrails on the edges.  Have any leaves, snow, or ice cleared regularly.  Use sand or salt on walking paths during winter.  Clean up any spills in your garage right away. This includes oil or grease spills. What can I do in the bathroom?  Use night lights.  Install grab bars by the toilet and in the tub and shower. Do not use towel bars as grab bars.  Use non-skid mats or decals in the tub or shower.  If you need to sit down in the shower, use a plastic, non-slip stool.  Keep the floor dry. Clean up any water that spills on the floor as soon as it happens.  Remove soap buildup in the tub or shower regularly.  Attach bath mats securely with double-sided non-slip rug tape.  Do not have throw rugs and other things on the floor that can make you trip. What can I do in the bedroom?  Use night lights.  Make sure that you have a light by your bed that is easy to reach.  Do not use any sheets or blankets that are too big for your bed. They should not hang down onto the floor.  Have a firm chair that has side arms. You can use this for support while you get dressed.  Do not have throw rugs and other things on the floor that can make you trip. What can I do in  the kitchen?  Clean up any spills right away.  Avoid walking on wet floors.  Keep items that you use a lot in easy-to-reach places.  If you need to reach something above you, use a strong step stool that has a grab bar.  Keep electrical cords out of the way.  Do not use floor polish or wax that makes floors slippery. If you must use wax, use non-skid floor wax.  Do not have throw rugs and other things on the floor that can make you trip. What can I do with my stairs?  Do not leave any items on the stairs.  Make sure that there are handrails on both sides of the stairs and use them. Fix handrails that are broken or loose. Make sure that handrails are as long as the stairways.  Check any carpeting to make sure that it is firmly attached to the stairs. Fix any carpet that is loose or worn.  Avoid having throw rugs at the top or bottom of the stairs. If you do have throw rugs, attach them to the floor with carpet tape.  Make sure that you have a light switch at the top of the stairs and the bottom of the stairs. If you  do not have them, ask someone to add them for you. What else can I do to help prevent falls?  Wear shoes that:  Do not have high heels.  Have rubber bottoms.  Are comfortable and fit you well.  Are closed at the toe. Do not wear sandals.  If you use a stepladder:  Make sure that it is fully opened. Do not climb a closed stepladder.  Make sure that both sides of the stepladder are locked into place.  Ask someone to hold it for you, if possible.  Clearly mark and make sure that you can see:  Any grab bars or handrails.  First and last steps.  Where the edge of each step is.  Use tools that help you move around (mobility aids) if they are needed. These include:  Canes.  Walkers.  Scooters.  Crutches.  Turn on the lights when you go into a dark area. Replace any light bulbs as soon as they burn out.  Set up your furniture so you have a clear  path. Avoid moving your furniture around.  If any of your floors are uneven, fix them.  If there are any pets around you, be aware of where they are.  Review your medicines with your doctor. Some medicines can make you feel dizzy. This can increase your chance of falling. Ask your doctor what other things that you can do to help prevent falls. This information is not intended to replace advice given to you by your health care provider. Make sure you discuss any questions you have with your health care provider. Document Released: 12/08/2008 Document Revised: 07/20/2015 Document Reviewed: 03/18/2014 Elsevier Interactive Patient Education  2017 Adam Burton, Male A healthy lifestyle and preventive care is important for your health and wellness. Ask your health care provider about what schedule of regular examinations is right for you. What should I know about weight and diet?  Eat a Healthy Diet  Eat plenty of vegetables, fruits, whole grains, low-fat dairy products, and lean protein.  Do not eat a lot of foods high in solid fats, added sugars, or salt. Maintain a Healthy Weight  Regular exercise can help you achieve or maintain a healthy weight. You should:  Do at least 150 minutes of exercise each week. The exercise should increase your heart rate and make you sweat (moderate-intensity exercise).  Do strength-training exercises at least twice a week. Watch Your Levels of Cholesterol and Blood Lipids  Have your blood tested for lipids and cholesterol every 5 years starting at 68 years of age. If you are at high risk for heart disease, you should start having your blood tested when you are 68 years old. You may need to have your cholesterol levels checked more often if:  Your lipid or cholesterol levels are high.  You are older than 68 years of age.  You are at high risk for heart disease. What should I know about cancer screening? Many types of cancers can  be detected early and may often be prevented. Lung Cancer  You should be screened every year for lung cancer if:  You are a current smoker who has smoked for at least 30 years.  You are a former smoker who has quit within the past 15 years.  Talk to your health care provider about your screening options, when you should start screening, and how often you should be screened. Colorectal Cancer  Routine colorectal cancer screening usually begins at 68 years  of age and should be repeated every 5-10 years until you are 68 years old. You may need to be screened more often if early forms of precancerous polyps or small growths are found. Your health care provider may recommend screening at an earlier age if you have risk factors for colon cancer.  Your health care provider may recommend using home test kits to check for hidden blood in the stool.  A small camera at the end of a tube can be used to examine your colon (sigmoidoscopy or colonoscopy). This checks for the earliest forms of colorectal cancer. Prostate and Testicular Cancer  Depending on your age and overall health, your health care provider may do certain tests to screen for prostate and testicular cancer.  Talk to your health care provider about any symptoms or concerns you have about testicular or prostate cancer. Skin Cancer  Check your skin from head to toe regularly.  Tell your health care provider about any new moles or changes in moles, especially if:  There is a change in a mole's size, shape, or color.  You have a mole that is larger than a pencil eraser.  Always use sunscreen. Apply sunscreen liberally and repeat throughout the day.  Protect yourself by wearing long sleeves, pants, a wide-brimmed hat, and sunglasses when outside. What should I know about heart disease, diabetes, and high blood pressure?  If you are 24-24 years of age, have your blood pressure checked every 3-5 years. If you are 63 years of age or  older, have your blood pressure checked every year. You should have your blood pressure measured twice-once when you are at a hospital or clinic, and once when you are not at a hospital or clinic. Record the average of the two measurements. To check your blood pressure when you are not at a hospital or clinic, you can use:  An automated blood pressure machine at a pharmacy.  A home blood pressure monitor.  Talk to your health care provider about your target blood pressure.  If you are between 76-79 years old, ask your health care provider if you should take aspirin to prevent heart disease.  Have regular diabetes screenings by checking your fasting blood sugar level.  If you are at a normal weight and have a low risk for diabetes, have this test once every three years after the age of 59.  If you are overweight and have a high risk for diabetes, consider being tested at a younger age or more often.  A one-time screening for abdominal aortic aneurysm (AAA) by ultrasound is recommended for men aged 54-75 years who are current or former smokers. What should I know about preventing infection? Hepatitis B  If you have a higher risk for hepatitis B, you should be screened for this virus. Talk with your health care provider to find out if you are at risk for hepatitis B infection. Hepatitis C  Blood testing is recommended for:  Everyone born from [birth year] through 1967.  Anyone with known risk factors for hepatitis C. Sexually Transmitted Diseases (STDs)  You should be screened each year for STDs including gonorrhea and chlamydia if:  You are sexually active and are younger than 68 years of age.  You are older than 68 years of age and your health care provider tells you that you are at risk for this type of infection.  Your sexual activity has changed since you were last screened and you are at an increased risk for chlamydia  or gonorrhea. Ask your health care provider if you are at  risk.  Talk with your health care provider about whether you are at high risk of being infected with HIV. Your health care provider may recommend a prescription medicine to help prevent HIV infection. What else can I do?  Schedule regular health, dental, and eye exams.  Stay current with your vaccines (immunizations).  Do not use any tobacco products, such as cigarettes, chewing tobacco, and e-cigarettes. If you need help quitting, ask your health care provider.  Limit alcohol intake to no more than 2 drinks per day. One drink equals 12 ounces of beer, 5 ounces of wine, or 1 ounces of hard liquor.  Do not use street drugs.  Do not share needles.  Ask your health care provider for help if you need support or information about quitting drugs.  Tell your health care provider if you often feel depressed.  Tell your health care provider if you have ever been abused or do not feel safe at home. This information is not intended to replace advice given to you by your health care provider. Make sure you discuss any questions you have with your health care provider. Document Released: 08/10/2007 Document Revised: 10/11/2015 Document Reviewed: 11/15/2014 Elsevier Interactive Patient Education  2017 [employer].

## 2016-06-12 NOTE — Progress Notes (Signed)
Subjective:   Adam Burton is a 66 y.o. male who presents for an Initial Medicare Annual Wellness Visit.  Cardiac Risk Factors include: advanced age (>84mn, >>95women);dyslipidemia;hypertension;male gender;smoking/ tobacco exposure    Objective:    Today's Vitals   06/12/16 1440  BP: 124/80  Pulse: 60  Temp: 98.5 F (36.9 C)  TempSrc: Oral  SpO2: 96%  Weight: 165 lb 3.2 oz (74.9 kg)  Height: '5\' 11"'  (1.803 m)   Body mass index is 23.04 kg/m.  Patient ran out of lisinopril/HCTZ 2 days ago. BP 124/80 left arm manually with adult cuff States feeling like passing out after standing from lying or reclining position X 4-5 months. PCP made aware and appt with PCP scheduled for 06/25/2016 at 0845 to discuss.   Current Medications (verified) Outpatient Encounter Prescriptions as of 06/12/2016  Medication Sig  . aspirin 81 MG tablet Take 81 mg by mouth daily.  .Marland Kitchenatorvastatin (LIPITOR) 20 MG tablet Take 1 tablet (20 mg total) by mouth daily.  .Marland Kitchenlisinopril-hydrochlorothiazide (PRINZIDE,ZESTORETIC) 20-25 MG tablet Take 1 tablet by mouth daily.  . finasteride (PROSCAR) 5 MG tablet Take 1 tablet (5 mg total) by mouth daily. (Patient not taking: Reported on 06/12/2016)   No facility-administered encounter medications on file as of 06/12/2016.    Stopped taking proscar but is taking a med he found on-line for BPH  Allergies (verified) Patient has no known allergies.   History: Past Medical History:  Diagnosis Date  . Hypercholesteremia   . Hypertension   . Renal insufficiency 08/21/2012   Past Surgical History:  Procedure Laterality Date  . CARDIAC CATHETERIZATION  01/2009   Dr. JMartinique Normal coronary arteries. Normal LV function.  .Marland KitchenHERNIA REPAIR    . NM MYOVIEW LTD  12/2008   Potential small area of mild ischemia in the basal inferoseptal wall. Otherwise normal. --> False positive test based on coronary angiography  . PROSTATE SURGERY    . Stress Echocardiogram  12/2013   Exercise for just over 4 minutes. Heart rate increased to 148 BPM. Hypertensive response to exercise. EF 60%. No evidence of ischemia. NORMAL STUDY  . TRANSTHORACIC ECHOCARDIOGRAM  11/2013   EF 55-60%. No regional wall motion abnormality is. Normal thickness. Mild LA dilation.   Family History  Problem Relation Age of Onset  . Heart attack Mother   . Diabetes Mother   . Diabetes Sister   . Diabetes Maternal Grandmother   . Heart attack Maternal Grandmother   . Diabetes Maternal Aunt    Social History   Occupational History  . Not on file.   Social History Main Topics  . Smoking status: Never Smoker  . Smokeless tobacco: Never Used  . Alcohol use Yes     Comment: beer or vodka occasionally  . Drug use: Yes    Types: Marijuana     Comment: 3-6 X per week  . Sexual activity: Yes   Tobacco Counseling Counseling given: Yes Patient has never smoked cigarettes but smokes marijuana 3-6 X per week  Activities of Daily Living In your present state of health, do you have any difficulty performing the following activities: 06/12/2016  Hearing? N  Vision? Y  Difficulty concentrating or making decisions? Y  Walking or climbing stairs? N  Dressing or bathing? N  Doing errands, shopping? N  Preparing Food and eating ? N  Using the Toilet? N  In the past six months, have you accidently leaked urine? Y  Do you have problems with loss  of bowel control? N  Managing your Medications? N  Managing your Finances? Y  Housekeeping or managing your Housekeeping? N  Some recent data might be hidden   Home Safety:  My home has a working smoke alarm:  Yes X 3           My home throw rugs have been fastened down to the floor or removed:  Removed I have non-slip mats in the bathtub and shower:  Non-slip surface         All my home's stairs have railings or bannisters: Two level home with half front step without handrail         My home's floors, stairs and hallways are free from clutter, wires  and cords:  Yes     I wear seatbelts consistently:  Yes   Immunizations and Health Maintenance Immunization History  Administered Date(s) Administered  . Influenza Whole 01/05/2009   Health Maintenance Due  Topic Date Due  . Hepatitis C Screening  06/13/1950  . PNA vac Low Risk Adult (1 of 2 - PCV13) 01/16/2014  . TETANUS/TDAP  03/29/2015  Hgb A1c drawn today = 6.0 down from 6.29 Sep 2015 Hep C drawn today Prevnar administered today Patient will obtain TDaP at local pharmacy Patient given FOBT kit and will bring back when completed  Patient Care Team: [name], MD as PCP - General (Family Medicine)  Indicate any recent Medical Services you may have received from other than [facility] providers in the past year (date may be approximate).    Assessment:   This is a routine wellness examination for [place].   Hearing/Vision screen  Hearing Screening   Method: Audiometry   '125Hz'  '250Hz'  '500Hz'  '1000Hz'  '2000Hz'  '3000Hz'  '4000Hz'  '6000Hz'  '8000Hz'   Right ear:   40 40 40  40    Left ear:   40 40 40  40      Dietary issues and exercise activities discussed: Current Exercise Habits: Home exercise routine, Type of exercise: walking, Time (Minutes): 60, Frequency (Times/Week): 5, Weekly Exercise (Minutes/Week): 300, Intensity: Mild  Goals    . Blood Pressure < 140/90 (pt-stated)    . HEMOGLOBIN A1C < 6.5 (pt-stated)    . Increase water intake      Depression Screen PHQ 2/9 Scores 06/12/2016 01/08/2016 09/08/2015 06/14/2015  PHQ - 2 Score 3 0 0 0  PHQ- 9 Score 6 - - -  Exception Documentation - - - Other- indicate reason in comment box    Discussed Behavioral Health Consult  Fall Risk Fall Risk  06/12/2016 09/08/2015 06/14/2015 06/14/2015 05/30/2015  Falls in the past year? No No No No No  Risk for fall due to : - - Other (Comment) Other (Comment) -  TUG Test:  Done in 10 seconds. Patient used both hands to sit back down only.  Cognitive Function: Mini-Cog  Passed with score  3/5   Screening Tests Health Maintenance  Topic Date Due  . Hepatitis C Screening  [birth year]  . PNA vac Low Risk Adult (1 of 2 - PCV13) 01/16/2014  . TETANUS/TDAP  03/29/2015  . INFLUENZA VACCINE  09/25/2016  . COLONOSCOPY  03/28/2021        Plan:     Patient ran out of lisinopril/HCTZ 2 days ago. BP 124/80 left arm manually with adult cuff States feeling like passing out after standing from lying or reclining position X 4-5 months. PCP made aware and appt with PCP scheduled for 06/25/2016 at 0845 to discuss.  Hgb A1c drawn today = 6.0 down from 6.29 Sep 2015 Hep C drawn today Prevnar administered today Patient will obtain TDaP at local pharmacy Patient given FOBT kit and will bring back when completed  During the course of the visit Marvel was educated and counseled about the following appropriate screening and preventive services:   Vaccines to include Pneumoccal, Influenza, Td, Zostavax/Shingrix  Colorectal cancer screening  Cardiovascular disease screening  Diabetes screening  Nutrition counseling   Patient Instructions (the written plan) were given to the patient.   Velora Heckler, RN   06/12/2016

## 2016-06-13 LAB — HCV AB W/RFLX TO VERIFICATION: HCV Ab: <0.1 s/co ratio (ref 0.0–0.9)

## 2016-06-13 LAB — HCV INTERPRETATION

## 2016-06-14 ENCOUNTER — Encounter: Payer: Self-pay | Admitting: *Deleted

## 2016-06-14 MED ORDER — ATORVASTATIN CALCIUM 20 MG PO TABS: 20.0000 mg | ORAL_TABLET | Freq: Every day | ORAL | 1 refills | Status: DC

## 2016-06-14 NOTE — Progress Notes (Signed)
I have reviewed this visit and discussed with Lauren Ducatte, RN, BSN, and agree with her documentation.   Keymani Glynn J Rogan Wigley, MD  

## 2016-06-19 DIAGNOSIS — Z1211 Encounter for screening for malignant neoplasm of colon: Secondary | ICD-10-CM | POA: Diagnosis not present

## 2016-06-19 NOTE — Addendum Note (Signed)
Addended by: [place] Pink on: 06/19/2016 12:23 PM   Modules accepted: Orders

## 2016-06-22 LAB — FECAL OCCULT BLOOD, IMMUNOCHEMICAL: Fecal Occult Bld: NEGATIVE

## 2016-06-25 ENCOUNTER — Encounter: Payer: Self-pay | Admitting: Family Medicine

## 2016-06-25 ENCOUNTER — Ambulatory Visit (INDEPENDENT_AMBULATORY_CARE_PROVIDER_SITE_OTHER): Payer: Medicare Other | Admitting: Family Medicine

## 2016-06-25 VITALS — BP 119/80 | HR 63 | Temp 97.7°F | Ht 71.0 in | Wt 167.6 lb

## 2016-06-25 DIAGNOSIS — I10 Essential (primary) hypertension: Secondary | ICD-10-CM | POA: Diagnosis not present

## 2016-06-25 DIAGNOSIS — E785 Hyperlipidemia, unspecified: Secondary | ICD-10-CM

## 2016-06-25 DIAGNOSIS — F1290 Cannabis use, unspecified, uncomplicated: Secondary | ICD-10-CM

## 2016-06-25 DIAGNOSIS — N401 Enlarged prostate with lower urinary tract symptoms: Secondary | ICD-10-CM | POA: Diagnosis not present

## 2016-06-25 DIAGNOSIS — N3943 Post-void dribbling: Secondary | ICD-10-CM

## 2016-06-25 MED ORDER — TETANUS-DIPHTH-ACELL PERTUSSIS 5-2.5-18.5 LF-MCG/0.5 IM SUSP: 0.5000 mL | Freq: Once | INTRAMUSCULAR | 0 refills | Status: AC

## 2016-06-25 NOTE — Patient Instructions (Signed)
It was nice to meet you today!  On your way out, schedule an appointment one morning to come back for fasting labs. Do not eat or drink anything other than water the morning of your lab appointment until after your labs are drawn.  Bring the prostate supplement with you to your next visit  See me in 6 months  Go to pharmacy and get tetanus shot - printed prescription for this today.  Be well, Dr. Ardelia Mems

## 2016-06-25 NOTE — Progress Notes (Signed)
Date of Visit: 06/25/2016   HPI:  Patient presents for routine follow up and to meet new PCP.  Hypertension - has been out of lisinopril-HCTZ for a while. Has some lightheadedness upon standing sometimes. Takes it slow. No chest pain or shortness of breath. No swelling.  BPH - doing well. Previously was on finasteride but no longer taking it. Instead is on an over the counter prostate supplement which he reports is doing well. Has some slight leakage of urine   THC use - smokes marijuana daily. No other drugs or toboacco.  ROS: See HPI.  Golf: history of hypertension, hyperlipidemia, BPH, prediabetes  PHYSICAL EXAM: BP 119/80 (BP Location: Right Arm, Patient Position: Sitting, Cuff Size: Normal)   Pulse 63   Temp 97.7 F (36.5 C) (Oral)   Ht 5\' 11"  (1.803 m)   Wt 167 lb 9.6 oz (76 kg)   SpO2 98%   BMI 23.38 kg/m  Gen: no acute distress, pleasant, cooperativen HEENT: normocephalic, atraumatic, moist mucous membranes  Heart: regular rate and rhythm, no murmur Lungs: clear to auscultation bilaterally normal work of breathing  Neuro: alert, grossly nonfocal, speech norma Ext: No appreciable lower extremity edema bilaterally   ASSESSMENT/PLAN:  Health maintenance:  -due for Tdap, will give printed rx  Essential hypertension, benign Orthostatics negative. Blood pressure well controlled off of medications. Stop lisinopril-HCTZ officially. Follow up in 6 months to recheck blood pressure, sooner if needed.  Hyperlipidemia with target low density lipoprotein (LDL) cholesterol less than 100 mg/dL Check lipids at fasting lab visit  Benign prostatic hyperplasia Symptoms well controlled on some sort of prostate "supplement" Asked him to bring the supplement with him to next visit so we can add it to his medication list Discussed limitations and potential risks of supplements that are not regulated by the FDA  Marijuana use Encouraged cessation  FOLLOW UP: Follow up in 6 mos  for blood pressure   [place] J. Ardelia Mems, [place]

## 2016-06-27 DIAGNOSIS — F1290 Cannabis use, unspecified, uncomplicated: Secondary | ICD-10-CM | POA: Insufficient documentation

## 2016-06-27 NOTE — Assessment & Plan Note (Signed)
Check lipids at fasting lab visit

## 2016-06-27 NOTE — Assessment & Plan Note (Signed)
Symptoms well controlled on some sort of prostate "supplement" Asked him to bring the supplement with him to next visit so we can add it to his medication list Discussed limitations and potential risks of supplements that are not regulated by the FDA

## 2016-06-27 NOTE — Assessment & Plan Note (Signed)
Encouraged cessation.

## 2016-06-27 NOTE — Assessment & Plan Note (Signed)
Orthostatics negative. Blood pressure well controlled off of medications. Stop lisinopril-HCTZ officially. Follow up in 6 months to recheck blood pressure, sooner if needed.

## 2016-07-29 ENCOUNTER — Emergency Department (HOSPITAL_COMMUNITY)
Admission: EM | Admit: 2016-07-29 | Discharge: 2016-07-29 | Disposition: A | Discharge status: Home / Self Care | Payer: Medicare Other | Attending: Emergency Medicine | Admitting: Emergency Medicine

## 2016-07-29 ENCOUNTER — Emergency Department (HOSPITAL_COMMUNITY): Payer: Medicare Other

## 2016-07-29 ENCOUNTER — Encounter (HOSPITAL_COMMUNITY): Payer: Self-pay | Admitting: Emergency Medicine

## 2016-07-29 DIAGNOSIS — S99922A Unspecified injury of left foot, initial encounter: Secondary | ICD-10-CM | POA: Diagnosis present

## 2016-07-29 DIAGNOSIS — I10 Essential (primary) hypertension: Secondary | ICD-10-CM | POA: Insufficient documentation

## 2016-07-29 DIAGNOSIS — R52 Pain, unspecified: Secondary | ICD-10-CM

## 2016-07-29 DIAGNOSIS — Z79899 Other long term (current) drug therapy: Secondary | ICD-10-CM | POA: Diagnosis not present

## 2016-07-29 DIAGNOSIS — S92415A Nondisplaced fracture of proximal phalanx of left great toe, initial encounter for closed fracture: Secondary | ICD-10-CM | POA: Insufficient documentation

## 2016-07-29 DIAGNOSIS — Z7982 Long term (current) use of aspirin: Secondary | ICD-10-CM | POA: Insufficient documentation

## 2016-07-29 DIAGNOSIS — Y939 Activity, unspecified: Secondary | ICD-10-CM | POA: Insufficient documentation

## 2016-07-29 DIAGNOSIS — Y999 Unspecified external cause status: Secondary | ICD-10-CM | POA: Diagnosis not present

## 2016-07-29 DIAGNOSIS — Z955 Presence of coronary angioplasty implant and graft: Secondary | ICD-10-CM | POA: Insufficient documentation

## 2016-07-29 DIAGNOSIS — Y92410 Unspecified street and highway as the place of occurrence of the external cause: Secondary | ICD-10-CM | POA: Insufficient documentation

## 2016-07-29 DIAGNOSIS — S92412A Displaced fracture of proximal phalanx of left great toe, initial encounter for closed fracture: Secondary | ICD-10-CM | POA: Diagnosis not present

## 2016-07-29 MED ORDER — HYDROCODONE-ACETAMINOPHEN 5-325 MG PO TABS: 1.0000 | ORAL_TABLET | Freq: Four times a day (QID) | ORAL | 0 refills | Status: DC | PRN

## 2016-07-29 MED ORDER — HYDROCODONE-ACETAMINOPHEN 5-325 MG PO TABS
1.0000 | ORAL_TABLET | Freq: Once | ORAL | Status: AC
  Administered 2016-07-29: 1 via ORAL
  Filled 2016-07-29: qty 1

## 2016-07-29 NOTE — ED Triage Notes (Signed)
Pt reports a hand truck fell onto his right big toe last week, has had pain and swelling since.

## 2016-07-29 NOTE — ED Notes (Signed)
Declined W/C at D/C and was escorted to lobby by RN. 

## 2016-07-29 NOTE — ED Provider Notes (Signed)
[facility] DEPT Provider Note   CSN: [id number] Arrival date & time: 07/29/16  1355  By signing my name below, I, Mayer Masker, attest that this documentation has been prepared under the direction and in the presence of Janetta Hora, PA-C. Electronically Signed: Mayer Masker, Scribe. 07/29/16. 3:35 PM.  History   Chief Complaint Chief Complaint  Patient presents with  . Toe Injury   The history is provided by the patient. No language interpreter was used.    HPI Comments:  Adam Burton is a 66 y.o. male who presents to the Emergency Department complaining of constant, gradually worsening left foot pain s/p a hand truck fell on it 1 week ago. He has associated swelling to the area and difficulty walking. He denies other associated symptoms or injuries.   Past Medical History:  Diagnosis Date  . Hypercholesteremia   . Hypertension   . Renal insufficiency 08/21/2012    Patient Active Problem List   Diagnosis Date Noted  . Marijuana use 06/27/2016  . Prediabetes 10/10/2015  . Left shoulder pain 05/30/2015  . Discomfort in chest 01/26/2015  . Cataract, left eye 11/03/2014  . Gastritis 02/11/2014  . Impaired fasting glucose 10/19/2013  . Substance abuse 09/04/2011  . Erectile dysfunction 09/04/2011  . Chest pain with low risk for cardiac etiology 04/19/2011  . Hemoptysis 03/29/2011  . Colon cancer screening 03/29/2011  . Prostate cancer screening 03/29/2011  . DOE (dyspnea on exertion) 11/28/2009  . Hyperlipidemia with target low density lipoprotein (LDL) cholesterol less than 100 mg/dL 04/04/2009  . Benign prostatic hyperplasia 04/04/2009  . NOCTURIA 01/05/2009  . Essential hypertension, benign 01/05/2009    Past Surgical History:  Procedure Laterality Date  . CARDIAC CATHETERIZATION  01/2009   Dr. Martinique. Normal coronary arteries. Normal LV function.  [name] HERNIA REPAIR    . NM MYOVIEW LTD  12/2008   Potential small area of mild ischemia in the basal inferoseptal  wall. Otherwise normal. --> False positive test based on coronary angiography  . PROSTATE SURGERY    . Stress Echocardiogram  12/2013   Exercise for just over 4 minutes. Heart rate increased to 148 BPM. Hypertensive response to exercise. EF 60%. No evidence of ischemia. NORMAL STUDY  . TRANSTHORACIC ECHOCARDIOGRAM  11/2013   EF 55-60%. No regional wall motion abnormality is. Normal thickness. Mild LA dilation.       Home Medications    Prior to Admission medications   Medication Sig Start Date End Date Taking? Authorizing Provider  aspirin 81 MG tablet Take 81 mg by mouth daily.    [provider]  atorvastatin (LIPITOR) 20 MG tablet Take 1 tablet (20 mg total) by mouth daily. 06/14/16   [name], MD    Family History Family History  Problem Relation Age of Onset  . Heart attack Mother   . Diabetes Mother   . Diabetes Sister   . Diabetes Maternal Grandmother   . Heart attack Maternal Grandmother   . Diabetes Maternal Aunt     Social History Social History  Substance Use Topics  . Smoking status: Never Smoker  . Smokeless tobacco: Never Used  . Alcohol use Yes     Comment: beer or vodka occasionally     Allergies   Patient has no known allergies.   Review of Systems Review of Systems  Constitutional: Negative for fever.  Musculoskeletal: Positive for arthralgias, gait problem and joint swelling.  All other systems reviewed and are negative.    Physical Exam Updated  Vital Signs BP 133/84   Pulse 67   Temp 97.8 F (36.6 C) (Oral)   Resp 12   SpO2 95%   Physical Exam  Constitutional: He is oriented to person, place, and time. He appears well-developed and well-nourished. No distress.  HENT:  Head: Normocephalic and atraumatic.  Eyes: Conjunctivae are normal. Pupils are equal, round, and reactive to light. Right eye exhibits no discharge. Left eye exhibits no discharge. No scleral icterus.  Neck: Normal range of motion.    Cardiovascular: Normal rate.   Pulmonary/Chest: Effort normal. No respiratory distress.  Abdominal: He exhibits no distension.  Musculoskeletal: He exhibits tenderness.  Left foot: Swelling over great toe, nail intact. Tenderness to great toe. No tenderness to foot or lesser toes  Neurological: He is alert and oriented to person, place, and time.  Neurovascularly intact  Skin: Skin is warm and dry.  Psychiatric: He has a normal mood and affect. His behavior is normal.  Nursing note and vitals reviewed.    ED Treatments / Results  DIAGNOSTIC STUDIES: Oxygen Saturation is 95% on RA, normal by my interpretation.    COORDINATION OF CARE: 3:34 PM Discussed treatment plan with pt at bedside and pt agreed to plan.  Labs (all labs ordered are listed, but only abnormal results are displayed) Labs Reviewed - No data to display  EKG  EKG Interpretation None       Radiology Dg Foot Complete Left  Result Date: 07/29/2016 CLINICAL DATA:  Pain and swelling after blunt trauma to the great toe 1 week ago. EXAM: LEFT FOOT - COMPLETE 3+ VIEW COMPARISON:  None. FINDINGS: There is a fracture of distal aspect of the proximal phalanx of the great toe. The fracture extends to the articular surface. No appreciable angulation. Minimal displacement. Slight osteoarthritis of the first MTP joint. IMPRESSION: Fracture of the distal aspect of the proximal phalanx of the great toe. Electronically Signed   By: Lorriane Shire M.D.   On: 07/29/2016 15:21    Procedures Procedures (including critical care time)  Medications Ordered in ED Medications - No data to display   Initial Impression / Assessment and Plan / ED Course  I have reviewed the triage vital signs and the nursing notes.  Pertinent labs & imaging results that were available during my care of the patient were reviewed by me and considered in my medical decision making (see chart for details).  66 year old with fracture of proximal phalanx  of left great toe. Advised post-op shoe, crutches, RICE. Follow up with podiatry. Return precautions given.  Final Clinical Impressions(s) / ED Diagnoses   Final diagnoses:  Closed nondisplaced fracture of proximal phalanx of left great toe, initial encounter    New Prescriptions New Prescriptions   No medications on file   I personally performed the services described in this documentation, which was scribed in my presence. The recorded information has been reviewed and is accurate.     Recardo Evangelist, PA-C 07/31/16 1331    Virgel Manifold, MD 08/05/16 [phone]

## 2016-07-29 NOTE — Discharge Instructions (Signed)
Rest - please stay off foot  for the next several days. After you can bear weight as tolerated. Ice - ice for 20 minutes at a time, several times a day Compression - wear brace to provide support Elevate - elevate foot above level of heart Ibuprofen - take with food. Take up to 3-4 times daily Follow up with podiatry

## 2016-07-29 NOTE — ED Notes (Signed)
Patient transported to X-ray 

## 2016-09-05 ENCOUNTER — Other Ambulatory Visit: Payer: Self-pay | Admitting: *Deleted

## 2016-09-05 NOTE — Telephone Encounter (Signed)
Refill request for 90 day supply.  Aqil Goetting L, RN  

## 2016-09-10 MED ORDER — ATORVASTATIN CALCIUM 20 MG PO TABS: 20.0000 mg | ORAL_TABLET | Freq: Every day | ORAL | 0 refills | Status: DC

## 2016-09-19 ENCOUNTER — Ambulatory Visit (INDEPENDENT_AMBULATORY_CARE_PROVIDER_SITE_OTHER): Payer: Medicare Other | Admitting: Student

## 2016-09-19 VITALS — BP 132/74 | HR 57 | Temp 97.9°F | Ht 71.0 in | Wt 159.8 lb

## 2016-09-19 DIAGNOSIS — L84 Corns and callosities: Secondary | ICD-10-CM | POA: Diagnosis not present

## 2016-09-19 NOTE — Patient Instructions (Signed)
It was great seeing you today! We have addressed the following issues today 1. Bump on your left foot: this could be a wart but hard to tell for sure. We have ordered a referral to a foot doctor. Someone will get in touch with you about this in the next couple of weeks.  If we did any lab work today, and the results require attention, either me or my nurse will get in touch with you. If everything is normal, you will get a letter in mail and a message via . If you don't hear from [place] in two weeks, please give [place] a call. Otherwise, we look forward to seeing you again at your next visit. If you have any questions or concerns before then, please call the clinic at [phone].  Please bring all your medications to every doctors visit  Sign up for My Chart to have easy access to your labs results, and communication with your Primary care physician.    Please check-out at the front desk before leaving the clinic.    Take Care,   Dr. Cyndia Skeeters

## 2016-09-19 NOTE — Progress Notes (Signed)
  Subjective:    Adam Burton is a 66 y.o. old male here for lump on his left foot  HPI Lump on left foot: noted this 3 days ago on his sole medially. Hurts when he steps on his foot. Denies fever or skin changes. He had left big toe fracture after dropping metal hand truck on his left foot about 6 weeks ago. He went to ED. X-ray of his foot showed fracture of the distal aspect of the proximal phalanx of the great toe. He was advised to wear post-op shoe, use crutches,  RICE and follow up with podiatry. He didn't follow up with podiatry. He has been walking on it. He says the fracture has healed and is no longer hurting.   PMH/Problem List: has Hyperlipidemia with target low density lipoprotein (LDL) cholesterol less than 100 mg/dL; Benign prostatic hyperplasia; DOE (dyspnea on exertion); NOCTURIA; Essential hypertension, benign; Hemoptysis; Colon cancer screening; Prostate cancer screening; Chest pain with low risk for cardiac etiology; Substance abuse; Erectile dysfunction; Impaired fasting glucose; Gastritis; Cataract, left eye; Discomfort in chest; Left shoulder pain; Prediabetes; Marijuana use; and Corn of foot on his problem list.   has a past medical history of Hypercholesteremia; Hypertension; and Renal insufficiency (08/21/2012).  FH:  Family History  Problem Relation Age of Onset  . Heart attack Mother   . Diabetes Mother   . Diabetes Sister   . Diabetes Maternal Grandmother   . Heart attack Maternal Grandmother   . Diabetes Maternal Aunt     SH Social History  Substance Use Topics  . Smoking status: Never Smoker  . Smokeless tobacco: Never Used  . Alcohol use Yes     Comment: beer or vodka occasionally    Review of Systems Review of systems negative except for pertinent positives and negatives in history of present illness above.     Objective:     Vitals:   09/19/16 1141  BP: 132/74  Pulse: (!) 57  Temp: 97.9 F (36.6 C)  TempSrc: Oral  SpO2: 96%  Weight: 159 lb  12.8 oz (72.5 kg)  Height: 5\' 11"  (1.803 m)    Physical Exam GEN: appears well, no apparent distress. CVS: RRR, nl S1&S2, no murmurs, no edema RESP: no IWOB, good air movement bilaterally, CTAB MSK: no focal tenderness or notable swelling No bony deformities, inflammation. There is a smooth but firm circular lump about 1.5 cm in diameter over the plantar aspect of the shaft of his left first metatarsal bone. It is slightly tender to palpation. It is semi mobile. No skin lesion or erythema. His fractured toe is healing well. No pes planus. Full ROM dorsi/plantar flexion, inversion & eversion.     SKIN: no apparent skin lesion NEURO: alert and oiented appropriately, no gross deficits  PSYCH: euthymic mood with congruent affect    Assessment and Plan:  Corn of foot Likely corn could also be something else. This is not over pressure areas which argues against calluses. Will refer to podiatry for further evaluation and management.   Orders Placed This Encounter  Procedures  . Ambulatory referral to Podiatry   Return if symptoms worsen or fail to improve.  Mercy Riding, MD 09/19/16 Pager: [phone]

## 2016-09-19 NOTE — Assessment & Plan Note (Signed)
Likely corn could also be something else. This is not over pressure areas which argues against calluses. Will refer to podiatry for further evaluation and management.

## 2016-10-17 ENCOUNTER — Ambulatory Visit: Payer: Self-pay | Admitting: Podiatry

## 2016-10-31 ENCOUNTER — Ambulatory Visit: Payer: Self-pay | Admitting: Podiatry

## 2016-11-14 ENCOUNTER — Ambulatory Visit (INDEPENDENT_AMBULATORY_CARE_PROVIDER_SITE_OTHER): Payer: Medicare Other | Admitting: Podiatry

## 2016-11-14 ENCOUNTER — Other Ambulatory Visit: Payer: Self-pay | Admitting: Podiatry

## 2016-11-14 ENCOUNTER — Encounter: Payer: Self-pay | Admitting: Podiatry

## 2016-11-14 ENCOUNTER — Ambulatory Visit (INDEPENDENT_AMBULATORY_CARE_PROVIDER_SITE_OTHER): Payer: Medicare Other

## 2016-11-14 VITALS — BP 178/101 | HR 74

## 2016-11-14 DIAGNOSIS — S92402A Displaced unspecified fracture of left great toe, initial encounter for closed fracture: Secondary | ICD-10-CM | POA: Diagnosis not present

## 2016-11-14 DIAGNOSIS — M722 Plantar fascial fibromatosis: Secondary | ICD-10-CM | POA: Diagnosis not present

## 2016-11-14 DIAGNOSIS — M79672 Pain in left foot: Secondary | ICD-10-CM | POA: Diagnosis not present

## 2016-11-14 MED ORDER — DICLOFENAC SODIUM 75 MG PO TBEC: 75.0000 mg | DELAYED_RELEASE_TABLET | Freq: Two times a day (BID) | ORAL | 2 refills | Status: DC

## 2016-11-14 MED ORDER — TRIAMCINOLONE ACETONIDE 10 MG/ML IJ SUSP
10.0000 mg | Freq: Once | INTRAMUSCULAR | Status: AC
  Administered 2016-11-14: 10 mg

## 2016-11-14 NOTE — Progress Notes (Signed)
   Subjective:    Patient ID: Adam Burton, male    DOB: August 17, 1950, 66 y.o.   MRN: [id number]  HPI  Chief Complaint  Patient presents with  . Foot Pain    Lt plantar       Review of Systems  Cardiovascular:       Calf pain with walking  Genitourinary: Positive for frequency.  Musculoskeletal: Positive for arthralgias.  All other systems reviewed and are negative.      Objective:   Physical Exam        Assessment & Plan:

## 2016-11-14 NOTE — Progress Notes (Signed)
Subjective:    Patient ID: Adam Burton, male   DOB: 66 y.o.   MRN: [id number]   HPI patient presents with several problems with one being swelling of his left big toe with history of fracture and second being problems in the plantar aspect left foot with a nodule in the distal fascia and moderate discomfort in the lateral side of the foot and lateral leg. Patient states that he did have an injury at work and he states that it's been swollen for the last 3 months and that there is been some slight redness and that his work history indicates that he has to be on his feet quite a bit    Review of Systems  All other systems reviewed and are negative.       Objective:  Physical Exam  Constitutional: He appears well-developed and well-nourished.  Cardiovascular: Intact distal pulses.   Pulmonary/Chest: Effort normal.  Musculoskeletal: Normal range of motion.  Neurological: He is alert.  Skin: Skin is warm.  Nursing note and vitals reviewed.  neurovascular status intact muscle strength adequate range of motion within normal limits with patient stating he is not a smoker currently. Patient's noted to have moderate swelling of the left hallux with inflammation of the plantar fascia distal with a small nodule measuring 5 x 5 mm and mild lateral pain     Assessment:    Fasciitis-like symptoms left distal along with fractured left hallux and moderate lateral pain secondary to compensation     Plan:   H&P conditions reviewed and explained that the toe should heal gradually but appears to be stable. I went ahead today and did distal fascial injection left 3 mg Kenalog 5 mg Xylocaine advised on heat ice therapy and if symptoms continue patient be seen back to recheck  X-rays did indicate fracture the left hallux with joint involvement but appears to be stable with no other known calcified type

## 2016-11-20 ENCOUNTER — Encounter: Payer: Self-pay | Admitting: Family Medicine

## 2016-11-20 ENCOUNTER — Ambulatory Visit (INDEPENDENT_AMBULATORY_CARE_PROVIDER_SITE_OTHER): Payer: Medicare Other | Admitting: Family Medicine

## 2016-11-20 VITALS — BP 134/80 | HR 70 | Temp 98.5°F | Ht 71.0 in | Wt 158.0 lb

## 2016-11-20 DIAGNOSIS — R1310 Dysphagia, unspecified: Secondary | ICD-10-CM

## 2016-11-20 DIAGNOSIS — R1319 Other dysphagia: Secondary | ICD-10-CM

## 2016-11-20 NOTE — Patient Instructions (Signed)

## 2016-11-20 NOTE — Progress Notes (Signed)
   Subjective:    Patient ID: Adam Burton is a 66 y.o. male presenting with No chief complaint on file.  on 11/20/2016  HPI: Patient here today with 3-4 wks of increasing chest tightness. Reports that it comes on with eating. Notes with food or water and with swallowing. He reports feeling like the food is getting stuck in his esophagus for several minutes prior to going down. Reports that he has had this in the past and has been told to see GI but has not done this. He states that he was not ready for a procedure at that time. He is here with his daughter today and she reports that things have gotten much worse in the last few weeks. It is now occurring with every episode of eating. He denies GERD, heartburn, finding un-chewed food in his mouth. He denies N/V. He is not a smoker. Reports right elbow pain which is worse after sleeping. Frequently sleeps with elbow hyperflexed and under his chin.  Review of Systems  Constitutional: Negative for chills and fever.  Respiratory: Negative for shortness of breath.   Cardiovascular: Negative for leg swelling.  Gastrointestinal: Negative for abdominal pain, nausea and vomiting.  Musculoskeletal: Positive for arthralgias (right elbow when he sleeps).      Objective:    Blood pressure 134/80, pulse 70, temperature 98.5 F (36.9 C), temperature source Oral, height 5\' 11"  (1.803 m), weight 158 lb (71.7 kg), SpO2 99 %.  Physical Exam  Constitutional: He appears well-developed and well-nourished. No distress.  HENT:  Head: Normocephalic and atraumatic.  Eyes: No scleral icterus.  Neck: Neck supple.  Cardiovascular: Normal rate and regular rhythm.   No murmur heard. Pulmonary/Chest: Effort normal and breath sounds normal. No respiratory distress. He has no wheezes.  Abdominal: Soft.  Musculoskeletal: He exhibits no edema.       Right elbow: He exhibits normal range of motion, no swelling, no effusion and no deformity. No tenderness found.    Neurological: He is alert.  Skin: Skin is warm.  Psychiatric: He has a normal mood and affect.       Assessment & Plan:   Problem List Items Addressed This Visit      Unprioritized   Esophageal dysphagia - Primary    Referral to GI for EGD is indicated in this patient. He is agreeable to this at this time. Please carefully chew all food.       Relevant Orders   Ambulatory referral to Gastroenterology     Try not to sleep with elbow at >90 degrees of flexion.  Total face-to-face time with patient: 15 minutes. Over 50% of encounter was spent on counseling and coordination of care.  Donnamae Jude 11/20/2016 4:29 PM

## 2016-11-20 NOTE — Assessment & Plan Note (Signed)
Referral to GI for EGD is indicated in this patient. He is agreeable to this at this time. Please carefully chew all food.

## 2016-11-21 ENCOUNTER — Encounter: Payer: Self-pay | Admitting: Gastroenterology

## 2017-01-15 ENCOUNTER — Ambulatory Visit (INDEPENDENT_AMBULATORY_CARE_PROVIDER_SITE_OTHER): Payer: Medicare Other | Admitting: Gastroenterology

## 2017-01-15 ENCOUNTER — Encounter: Payer: Self-pay | Admitting: Gastroenterology

## 2017-01-15 VITALS — BP 140/94 | HR 77 | Ht 71.0 in | Wt 174.6 lb

## 2017-01-15 DIAGNOSIS — K219 Gastro-esophageal reflux disease without esophagitis: Secondary | ICD-10-CM | POA: Diagnosis not present

## 2017-01-15 DIAGNOSIS — R1319 Other dysphagia: Secondary | ICD-10-CM

## 2017-01-15 DIAGNOSIS — R1310 Dysphagia, unspecified: Secondary | ICD-10-CM

## 2017-01-15 NOTE — Patient Instructions (Addendum)
If you are age 68 or older, your body mass index should be between 23-30. Your Body mass index is 24.35 kg/m. If this is out of the aforementioned range listed, please consider follow up with your Primary Care Provider.  If you are age 57 or younger, your body mass index should be between 19-25. Your Body mass index is 24.35 kg/m. If this is out of the aformentioned range listed, please consider follow up with your Primary Care Provider.   You have been scheduled for an endoscopy. Please follow written instructions given to you at your visit today. If you use inhalers (even only as needed), please bring them with you on the day of your procedure. Your physician has requested that you go to www.startemmi.com and enter the access code given to you at your visit today. This web site gives a general overview about your procedure. However, you should still follow specific instructions given to you by our office regarding your preparation for the procedure.  Begin over the counter omeprazole 20 mg once a day  Thank you for choosing [facility]  Dr Wilfrid Lund III

## 2017-01-15 NOTE — Progress Notes (Signed)
[facility] Consult Note:  History: Adam Burton 01/15/2017  Referring physician: [name], MD  Reason for consult/chief complaint: Dysphagia (Solid food and juice is hard to swallow, started a few weeks ago)   Subjective  HPI:  This is a 66 year old man referred by primary care noted above for worsening dysphagia to solids and liquids.  He initially said it had been a problem for a few weeks, but I note that he made mention of it to his cardiologist as far back as February.  He then indicated that the problem has just been getting worse of late.  He has had chronic heartburn for years for which he takes papaya enzyme.  Over the last several weeks he has had increasing dysphagia more so to solids and liquids.  He denies nausea, vomiting, early satiety or weight loss. His bowel habits are reportedly regular with no rectal bleeding.  He is uncertain when he may have had a colonoscopy done in the past.  ROS:  Review of Systems  Constitutional: Negative for appetite change and unexpected weight change.  HENT: Negative for mouth sores and voice change.   Eyes: Negative for pain and redness.  Respiratory: Negative for cough and shortness of breath.   Cardiovascular: Negative for chest pain and palpitations.  Genitourinary: Negative for dysuria and hematuria.  Musculoskeletal: Negative for arthralgias and myalgias.  Skin: Negative for pallor and rash.  Neurological: Negative for weakness and headaches.  Hematological: Negative for adenopathy.     Past Medical History: Past Medical History:  Diagnosis Date  . Hypercholesteremia   . Hypertension   . Renal insufficiency 08/21/2012     Past Surgical History: Past Surgical History:  Procedure Laterality Date  . CARDIAC CATHETERIZATION  01/2009   Dr. Martin[place]. Normal coronary arteries. Normal LV function.  [name] HERNIA REPAIR    . NM MYOVIEW LTD  12/2008   Potential small area of mild ischemia in the  basal inferoseptal wall. Otherwise normal. --> False positive test based on coronary angiography  . Stress Echocardiogram  12/2013   Exercise for just over 4 minutes. Heart rate increased to 148 BPM. Hypertensive response to exercise. EF 60%. No evidence of ischemia. NORMAL STUDY  . TRANSTHORACIC ECHOCARDIOGRAM  11/2013   EF 55-60%. No regional wall motion abnormality is. Normal thickness. Mild LA dilation.     Family History: Family History  Problem Relation Age of Onset  . Heart attack Mother   . Diabetes Mother   . Diabetes Sister   . Diabetes Maternal Grandmother   . Heart attack Maternal Grandmother   . Diabetes Maternal Aunt     Social History: Social History   Socioeconomic History  . Marital status: Widowed    Spouse name: None  . Number of children: None  . Years of education: None  . Highest education level: None  Social Needs  . Financial resource strain: None  . Food insecurity - worry: None  . Food insecurity - inability: None  . Transportation needs - medical: None  . Transportation needs - non-medical: None  Occupational History  . None  Tobacco Use  . Smoking status: Never Smoker  . Smokeless tobacco: Never Used  Substance and Sexual Activity  . Alcohol use: Yes    Comment: beer or vodka occasionally  . Drug use: Yes    Types: Marijuana    Comment: 3-6 X per week  . Sexual activity: Yes  Other Topics Concern  . None  Social History Narrative  He is a widowed father of 5, grandfather of 3. He lives with his current girlfriend. 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    Allergies: No Known Allergies  Outpatient Meds: Current Outpatient Medications  Medication Sig Dispense Refill  . aspirin 81 MG tablet Take 81 mg by mouth daily.    [name]  atorvastatin (LIPITOR) 20 MG tablet Take 1 tablet (20 mg total) by mouth daily. 90 tablet 0  . diclofenac (VOLTAREN) 75 MG EC tablet Take 1 tablet (75 mg total) by mouth 2 (two) times daily. 50 tablet 2   No current facility-administered medications for this visit.       ___________________________________________________________________ Objective   Exam:  BP (!) 140/94   Pulse 77   Ht 5\' 11"  (1.803 m)   Wt 174 lb 9.6 oz (79.2 kg)   SpO2 97%   BMI 24.35 kg/m    General: this is a(n) well-appearing man with no muscle wasting  Eyes: sclera anicteric, no redness  ENT: oral mucosa moist without lesions, no cervical or supraclavicular lymphadenopathy, good dentition  CV: RRR without murmur, S1/S2, no JVD, no peripheral edema  Resp: clear to auscultation bilaterally, normal RR and effort noted  GI: soft, no tenderness, with active bowel sounds. No guarding or palpable organomegaly noted.  Skin; warm and dry, no rash or jaundice noted  Neuro: awake, alert and oriented x 3. Normal gross motor function and fluent speech  Labs:  CBC Latest Ref Rng & Units 11/01/2014 12/06/2013 10/19/2013  WBC 4.0 - 10.5 K/uL 4.9 7.4 4.4  Hemoglobin 13.0 - 17.0 g/dL 15.2 16.5 14.5  Hematocrit 39.0 - 52.0 % 44.2 48.5 42.2  Platelets 150 - 400 K/uL 356 327 340   CMP Latest Ref Rng & Units 10/10/2015 09/19/2015 11/16/2014  Glucose 65 - 99 mg/dL 79 147(H) 111(H)  BUN 7 - 25 mg/dL 19 15 15   Creatinine 0.70 - 1.25 mg/dL 1.34(H) 1.36(H) 1.30(H)  Sodium 135 - 146 mmol/L 138 136 136  Potassium 3.5 - 5.3 mmol/L 4.2 4.2 4.3  Chloride 98 - 110 mmol/L 100 102 101  CO2 20 - 31 mmol/L 22 24 27   Calcium 8.6 - 10.3 mg/dL 9.8 9.7 9.1  Total Protein 6.1 - 8.1 g/dL - - -  Total Bilirubin 0.2 - 1.2 mg/dL - - -  Alkaline Phos 40 - 115 U/L - - -  AST 10 - 35 U/L - - -  ALT 9 - 46 U/L - - -    Assessment: Encounter Diagnoses  Name Primary?  . Esophageal dysphagia Yes  . 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  Achalasia and malignancy seems less likely.  Plan:  Upper endoscopy with probable dilation.  He is agreeable after thorough discussion of procedure and risks.  The benefits and risks of the planned procedure were described in detail with the patient or (when appropriate) their health care proxy.  Risks were outlined as including, but not limited to, bleeding, infection, perforation, adverse medication reaction leading to cardiac or pulmonary decompensation, or pancreatitis (if ERCP).  The limitation of incomplete mucosal visualization was also discussed.  No guarantees or warranties were given.  I  advised him to take OTC omeprazole 20 mg once daily.  Thank you for the courtesy of this consult.  Please call me with any questions or concerns.  Nelida Meuse III  CC: [name], MD

## 2017-01-23 ENCOUNTER — Ambulatory Visit (AMBULATORY_SURGERY_CENTER): Payer: Medicare Other | Admitting: Gastroenterology

## 2017-01-23 ENCOUNTER — Other Ambulatory Visit: Payer: Self-pay

## 2017-01-23 ENCOUNTER — Encounter: Payer: Self-pay | Admitting: Gastroenterology

## 2017-01-23 VITALS — BP 111/68 | HR 65 | Temp 98.0°F | Resp 14 | Ht 71.0 in | Wt 174.0 lb

## 2017-01-23 DIAGNOSIS — R1310 Dysphagia, unspecified: Secondary | ICD-10-CM

## 2017-01-23 DIAGNOSIS — K219 Gastro-esophageal reflux disease without esophagitis: Secondary | ICD-10-CM

## 2017-01-23 MED ORDER — SODIUM CHLORIDE 0.9 % IV SOLN
500.0000 mL | INTRAVENOUS | Status: DC
Start: 2017-01-23 — End: 2017-01-23

## 2017-01-23 NOTE — Patient Instructions (Signed)
Discharge instructions given. Normal exam. Office will call to schedule office visit. Resume previous medications. YOU HAD AN ENDOSCOPIC PROCEDURE TODAY AT [facility]:   Refer to the procedure report that was given to you for any specific questions about what was found during the examination.  If the procedure report does not answer your questions, please call your gastroenterologist to clarify.  If you requested that your care partner not be given the details of your procedure findings, then the procedure report has been included in a sealed envelope for you to review at your convenience later.  YOU SHOULD EXPECT: Some feelings of bloating in the abdomen. Passage of more gas than usual.  Walking can help get rid of the air that was put into your GI tract during the procedure and reduce the bloating. If you had a lower endoscopy (such as a colonoscopy or flexible sigmoidoscopy) you may notice spotting of blood in your stool or on the toilet paper. If you underwent a bowel prep for your procedure, you may not have a normal bowel movement for a few days.  Please Note:  You might notice some irritation and congestion in your nose or some drainage.  This is from the oxygen used during your procedure.  There is no need for concern and it should clear up in a day or so.  SYMPTOMS TO REPORT IMMEDIATELY:    Following upper endoscopy (EGD)  Vomiting of blood or coffee ground material  New chest pain or pain under the shoulder blades  Painful or persistently difficult swallowing  New shortness of breath  Fever of 100F or higher  Black, tarry-looking stools  For urgent or emergent issues, a gastroenterologist can be reached at any hour by calling [phone].   DIET:  We do recommend a small meal at first, but then you may proceed to your regular diet.  Drink plenty of fluids but you should avoid alcoholic beverages for 24 hours.  ACTIVITY:  You should plan to take it easy for  the rest of today and you should NOT DRIVE or use heavy machinery until tomorrow (because of the sedation medicines used during the test).    FOLLOW UP: Our staff will call the number listed on your records the next business day following your procedure to check on you and address any questions or concerns that you may have regarding the information given to you following your procedure. If we do not reach you, we will leave a message.  However, if you are feeling well and you are not experiencing any problems, there is no need to return our call.  We will assume that you have returned to your regular daily activities without incident.  If any biopsies were taken you will be contacted by phone or by letter within the next 1-3 weeks.  Please call us at [phone] if you have not heard about the biopsies in 3 weeks.    SIGNATURES/CONFIDENTIALITY: You and/or your care partner have signed paperwork which will be entered into your electronic medical record.  These signatures attest to the fact that that the information above on your After Visit Summary has been reviewed and is understood.  Full responsibility of the confidentiality of this discharge information lies with you and/or your care-partner.

## 2017-01-23 NOTE — Op Note (Signed)
Adam Burton Patient Name: Adam Burton Procedure Date: 01/23/2017 7:35 AM MRN: [id number] Endoscopist: Mallie Mussel L. Loletha Carrow , MD Age: 66 Referring MD:  Date of Birth: 1951-01-01 Gender: Male Account #: [id number] Procedure:                Upper GI endoscopy Indications:              Esophageal dysphagia Medicines:                Monitored Anesthesia Care Procedure:                Pre-Anesthesia Assessment:                           - Prior to the procedure, a History and Physical                            was performed, and patient medications and                            allergies were reviewed. The patient's tolerance of                            previous anesthesia was also reviewed. The risks                            and benefits of the procedure and the sedation                            options and risks were discussed with the patient.                            All questions were answered, and informed consent                            was obtained. Anticoagulants: The patient has taken                            aspirin. It was decided not to withhold this                            medication prior to the procedure. ASA Grade                            Assessment: II - A patient with mild systemic                            disease. After reviewing the risks and benefits,                            the patient was deemed in satisfactory condition to                            undergo the procedure.  After obtaining informed consent, the endoscope was                            passed under direct vision. Throughout the                            procedure, the patient's blood pressure, pulse, and                            oxygen saturations were monitored continuously. The                            Endoscope was introduced through the mouth, and                            advanced to the second part of duodenum. The upper                    GI endoscopy was accomplished without difficulty.                            The patient tolerated the procedure well. Scope In: Scope Out: Findings:                 The larynx was normal.                           The esophagus was normal. Specifically, no mucosal                            abnormalities, stricture, mass, dilatation or                            resistance at EGJ.                           The stomach was normal.                           The cardia and gastric fundus were normal on                            retroflexion.                           The examined duodenum was normal. Complications:            No immediate complications. Estimated Blood Loss:     Estimated blood loss: none. Impression:               - Normal larynx.                           - Normal esophagus.                           - Normal stomach.                           -  Normal examined duodenum.                           - No specimens collected.                           Patient has frequent heartburn. 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 Recommendation:           - Patient has a contact number available for                            emergencies. The signs and symptoms of potential                            delayed complications were discussed with the                            patient. Return to normal activities tomorrow.                            Written discharge instructions were provided to the                            patient.                           - Resume previous diet.                           - Continue present medications, including the OTC                            omeprazole recommended at last-week's office visit.                           - Return to my office in about 6 weeks. If                            heartburn under control but little or no                            improvement in dyphagia  at that time, most likely                            pursue esophageal manometry. Penny Frisbie L. Loletha Carrow, MD 01/23/2017 7:55:57 AM This report has been signed electronically.

## 2017-01-23 NOTE — Progress Notes (Signed)
To PACU  At awake and alert. rejport to RN

## 2017-01-23 NOTE — Progress Notes (Signed)
No egg or soy allergy known to patient  No issues with past sedation with any surgeries  or procedures, no intubation problems  No diet pills per patient No home 02 use per patient  No blood thinners per patient Called to room to assist during endoscopic procedure.  Patient ID and intended procedure confirmed with present staff. Received instructions for my participation in the procedure from the performing physician. No A fib or A flutter

## 2017-01-24 ENCOUNTER — Telehealth: Payer: Self-pay | Admitting: *Deleted

## 2017-01-24 NOTE — Telephone Encounter (Signed)
  Follow up Call-  Call back number 01/23/2017  Post procedure Call Back phone  # [phone]  Permission to leave phone message Yes  Some recent data might be hidden     Patient questions:  Do you have a fever, pain , or abdominal swelling? No. Pain Score  0 *  Have you tolerated food without any problems? Yes.    Have you been able to return to your normal activities? Yes.    Do you have any questions about your discharge instructions: Diet   No. Medications  No. Follow up visit  No.  Do you have questions or concerns about your Care? No.  Actions: * If pain score is 4 or above: No action needed, pain <4.

## 2017-03-07 ENCOUNTER — Encounter: Payer: Self-pay | Admitting: Gastroenterology

## 2017-03-07 ENCOUNTER — Ambulatory Visit (INDEPENDENT_AMBULATORY_CARE_PROVIDER_SITE_OTHER): Payer: Medicare Other | Admitting: Gastroenterology

## 2017-03-07 VITALS — BP 114/80 | HR 80 | Ht 71.0 in | Wt 179.6 lb

## 2017-03-07 DIAGNOSIS — K219 Gastro-esophageal reflux disease without esophagitis: Secondary | ICD-10-CM | POA: Diagnosis not present

## 2017-03-07 DIAGNOSIS — R1310 Dysphagia, unspecified: Secondary | ICD-10-CM | POA: Diagnosis not present

## 2017-03-07 NOTE — Patient Instructions (Addendum)
If you are age 69 or older, your body mass index should be between 23-30. Your Body mass index is 25.05 kg/m. If this is out of the aforementioned range listed, please consider follow up with your Primary Care Provider.  If you are age 12 or younger, your body mass index should be between 19-25. Your Body mass index is 25.05 kg/m. If this is out of the aformentioned range listed, please consider follow up with your Primary Care Provider.   Omeprazole 20 mg can be taken every evening to prevent night time heartburn , especially if you eat late.  Alternatively, you can take generic "pepcid AC" as needed if night time heartburn occurs.  Call us as needed. [phone]  Thank you for choosing [facility]  Dr Wilfrid Lund III

## 2017-03-07 NOTE — Progress Notes (Signed)
     [facility] Progress Note  Chief Complaint: GERD and dysphagia  Subjective  History:  Adam Burton follows up after his recent upper endoscopy.  He continues to have feelings of regurgitation and pyrosis, especially after laying down in the evening.  He still has not tried acid suppression, as he was concerned about medicines in general.  He sometimes eats late in the evening and this will prompt symptoms.  His dysphagia is much improved.  Upper endoscopy was a normal study.  ROS: Cardiovascular:  no chest pain Respiratory: no dyspnea  The patient's Past Medical, Family and Social History were reviewed and are on file in the EMR.  Objective:  Med list reviewed  Current Outpatient Medications:  .  aspirin 81 MG tablet, Take 81 mg by mouth daily., Disp: , Rfl:  .  atorvastatin (LIPITOR) 20 MG tablet, Take 1 tablet (20 mg total) by mouth daily., Disp: 90 tablet, Rfl: 0 .  diclofenac (VOLTAREN) 75 MG EC tablet, Take 1 tablet (75 mg total) by mouth 2 (two) times daily., Disp: 50 tablet, Rfl: 2 .  lisinopril-hydrochlorothiazide (PRINZIDE,ZESTORETIC) 20-25 MG tablet, , Disp: , Rfl:    Vital signs in last 24 hrs: Vitals:   03/07/17 1101  BP: 114/80  Pulse: 80    Physical Exam  No exam today.  15-minute visit, all of which spent on counseling and coordinating care.    @ASSESSMENTPLANBEGIN @ Assessment: Encounter Diagnoses  Name Primary?  . Gastroesophageal reflux disease without esophagitis Yes  . Dysphagia, unspecified type    He has nonerosive reflux disease, with dysphagia that I believe is dysmotility from reflux.  We again discussed diet and lifestyle measures that I think would help, especially not eating within several hours of bed and elevating the head of bed since he has nocturnal symptoms.  I asked him to seriously consider at least a short trial of once daily PPI therapy at his evening meal, and he will consider it.  He may also consider generic Pepcid  complete for as needed use instead.  He will plan to see me as needed.   Nelida Meuse III

## 2017-03-18 ENCOUNTER — Ambulatory Visit (INDEPENDENT_AMBULATORY_CARE_PROVIDER_SITE_OTHER): Payer: Medicare Other | Admitting: Student

## 2017-03-18 ENCOUNTER — Other Ambulatory Visit: Payer: Self-pay

## 2017-03-18 ENCOUNTER — Encounter: Payer: Self-pay | Admitting: Student

## 2017-03-18 ENCOUNTER — Other Ambulatory Visit (HOSPITAL_COMMUNITY)
Admission: RE | Admit: 2017-03-18 | Discharge: 2017-03-18 | Disposition: A | Discharge status: Home / Self Care | Payer: Medicare Other | Source: Ambulatory Visit | Attending: Family Medicine | Admitting: Family Medicine

## 2017-03-18 VITALS — BP 140/80 | HR 78 | Temp 99.2°F | Ht 71.0 in | Wt 184.0 lb

## 2017-03-18 DIAGNOSIS — Z7251 High risk heterosexual behavior: Secondary | ICD-10-CM | POA: Diagnosis present

## 2017-03-18 DIAGNOSIS — R300 Dysuria: Secondary | ICD-10-CM | POA: Diagnosis not present

## 2017-03-18 LAB — POCT URINALYSIS DIP (MANUAL ENTRY)
Bilirubin, UA: NEGATIVE
Glucose, UA: NEGATIVE mg/dL
Ketones, POC UA: NEGATIVE mg/dL
Leukocytes, UA: NEGATIVE
Nitrite, UA: NEGATIVE
Protein Ur, POC: NEGATIVE mg/dL
Spec Grav, UA: 1.02 (ref 1.010–1.025)
Urobilinogen, UA: 0.2 E.U./dL
pH, UA: 6.5 (ref 5.0–8.0)

## 2017-03-18 NOTE — Progress Notes (Signed)
Subjective:    Adam Burton is a 67 y.o. old male here for dysuria after unprotected sex.  HPI Had unprotected sex with a girl about a week ago. Two days later he felt dysuria and "straining" in perineal area. Not sure about discharge but he noticed some stain on his underwear that looked yellowish. Denies blood or skin lesion. Urine stream is thinner. He urinates 2-3 times a night. Denies sense of incomplete void or urgency. Denies lumps or bumps in his groin area. He says his symptoms felt like when he had gonorrhea when he was young. Denies fever, back pain or unintential weight loss.   Denies smoking cigarettes, drinking alcohol or recreational drug use. PMH/Problem List: has Hyperlipidemia with target low density lipoprotein (LDL) cholesterol less than 100 mg/dL; Benign prostatic hyperplasia; DOE (dyspnea on exertion); NOCTURIA; Essential hypertension, benign; Hemoptysis; Colon cancer screening; Prostate cancer screening; Chest pain with low risk for cardiac etiology; Substance abuse ([place]); Erectile dysfunction; Impaired fasting glucose; Gastritis; Cataract, left eye; Discomfort in chest; Left shoulder pain; Prediabetes; Marijuana use; Corn of foot; and Esophageal dysphagia on their problem list.   has a past medical history of Hypercholesteremia, Hypertension, and Renal insufficiency (08/21/2012).  FH:  Family History  Problem Relation Age of Onset  . Heart attack Mother   . Diabetes Mother   . Diabetes Sister   . Diabetes Maternal Grandmother   . Heart attack Maternal Grandmother   . Diabetes Maternal Aunt   . Colon cancer Neg Hx   . Colon polyps Neg Hx   . Esophageal cancer Neg Hx   . Rectal cancer Neg Hx   . Stomach cancer Neg Hx     SH Social History   Tobacco Use  . Smoking status: Never Smoker  . Smokeless tobacco: Never Used  Substance Use Topics  . Alcohol use: Yes    Comment: beer or vodka occasionally  . Drug use: Yes    Types: Marijuana    Comment: 3-6 X per week      Review of Systems Review of systems negative except for pertinent positives and negatives in history of present illness above.     Objective:     Vitals:   03/18/17 1402  BP: 140/80  Pulse: 78  Temp: 99.2 F (37.3 C)  TempSrc: Oral  SpO2: 99%  Weight: 184 lb (83.5 kg)  Height: 5\' 11"  (1.803 m)   Body mass index is 25.66 kg/m.  Physical Exam  GEN: appears well, no apparent distress. Oropharynx: mmm without erythema or exudation HEM: negative for cervical or periauricular lymphadenopathies CVS: RRR, nl s1 & s2, no murmurs, no edema RESP: no IWOB GI: BS present & normal, soft, NTND, no guarding, no rebound, no mass GU: Uncircumcised, no apparent lesion or swelling, no apparent erythema, no inguinal lymphadenopathy no notable discharge or meatal stricture, no testicular tenderness or swelling, no inguinal hernia, no suprapubic or CVA tenderness. MSK: no focal tenderness or notable swelling SKIN: no apparent skin lesion NEURO: alert and oiented appropriately, no gross deficits  PSYCH: euthymic mood with congruent affect Assessment and Plan:  1. Unprotected sexual intercourse: Genital exam within normal limits. Discussed with the patient that his test today may not pick up some of the infections from recent exposure.  Discussed about safe sex and gave him handout.  - Urine cytology ancillary only - HIV antibody (with reflex) - RPR - Hepatitis B surface antigen - Hepatitis B core antibody, IgM - POCT urinalysis dipstick  2. Dysuria: patient with  dysuria, reduced urine stream and nocturnal urination.  Urinalysis negative except for trace blood.  Will check STD labs as above.  Will check PSA as well.  Return if symptoms worsen or fail to improve.  Mercy Riding, MD 03/18/17 Pager: [phone]

## 2017-03-18 NOTE — Patient Instructions (Signed)
It was great seeing you today! We have addressed the following issues today  For burning with urination, we have done some tests today.  Someone will get in touch with you to discuss about the test results.   If we did any lab work today, and the results require attention, either me or my nurse will get in touch with you. If everything is normal, you will get a letter in mail and a message via . If you don't hear from [place] in two weeks, please give [place] a call. Otherwise, we look forward to seeing you again at your next visit. If you have any questions or concerns before then, please call the clinic at [phone].  Please bring all your medications to every doctors visit  Sign up for My Chart to have easy access to your labs results, and communication with your Primary care physician.    Please check-out at the front desk before leaving the clinic.    Take Care,   Dr. Erik Obey Sex Practicing safe sex means taking steps before and during sex to reduce your risk of:  Getting an STD (sexually transmitted disease).  Giving your partner an STD.  Unwanted pregnancy.  How can I practice safe sex?  To practice safe sex:  Limit your sexual partners to only one partner who is having sex with only you.  Avoid using alcohol and recreational drugs before having sex. These substances can affect your judgment.  Before having sex with a new partner: ? Talk to your partner about past partners, past STDs, and drug use. ? You and your partner should be screened for STDs and discuss the results with each other.  Check your body regularly for sores, blisters, rashes, or unusual discharge. If you notice any of these problems, visit your health care provider.  If you have symptoms of an infection or you are being treated for an STD, avoid sexual contact.  While having sex, use a condom. Make sure to: ? Use a condom every time you have vaginal, oral, or anal sex. Both females and males  should wear condoms during oral sex. ? Keep condoms in place from the beginning to the end of sexual activity. ? Use a latex condom, if possible. Latex condoms offer the best protection. ? Use only water-based lubricants or oils to lubricate a condom. Using petroleum-based lubricants or oils will weaken the condom and increase the chance that it will break.  See your health care provider for regular screenings, exams, and tests for STDs.  Talk with your health care provider about the form of birth control (contraception) that is best for you.  Get vaccinated against hepatitis B and human papillomavirus (HPV).  If you are at risk of being infected with HIV (human immunodeficiency virus), talk with your health care provider about taking a prescription medicine to prevent HIV infection. You are considered at risk for HIV if: ? You are a man who has sex with other men. ? You are a heterosexual man or woman who is sexually active with more than one partner. ? You take drugs by injection. ? You are sexually active with a partner who has HIV.  This information is not intended to replace advice given to you by your health care provider. Make sure you discuss any questions you have with your health care provider. Document Released: 03/21/2004 Document Revised: 06/28/2015 Document Reviewed: 01/01/2015 Elsevier Interactive Patient Education  [employer].

## 2017-03-19 ENCOUNTER — Telehealth: Payer: Self-pay | Admitting: Student

## 2017-03-19 LAB — PSA: Prostate Specific Ag, Serum: 2.1 ng/mL (ref 0.0–4.0)

## 2017-03-19 LAB — URINE CYTOLOGY ANCILLARY ONLY
Chlamydia: NEGATIVE
Neisseria Gonorrhea: NEGATIVE
Trichomonas: NEGATIVE

## 2017-03-19 LAB — HIV ANTIBODY (ROUTINE TESTING W REFLEX): HIV Screen 4th Generation wRfx: NONREACTIVE

## 2017-03-19 LAB — RPR: RPR Ser Ql: NONREACTIVE

## 2017-03-19 LAB — HEPATITIS B SURFACE ANTIGEN: Hepatitis B Surface Ag: NEGATIVE

## 2017-03-19 LAB — HEPATITIS B CORE ANTIBODY, IGM: Hep B C IgM: NEGATIVE

## 2017-03-19 NOTE — Telephone Encounter (Signed)
Discussed STD labs, PSA and UA which are normal except for trace blood on UA. This has been the same for years. Warned patient infection from recent exposure could be missed and recommended retesting in a month or earlier if he has symptoms. Patient voiced understanding and agrees.

## 2017-04-08 ENCOUNTER — Ambulatory Visit: Payer: Medicare Other | Admitting: Cardiology

## 2017-05-29 ENCOUNTER — Telehealth: Payer: Self-pay | Admitting: Family Medicine

## 2017-05-29 NOTE — Telephone Encounter (Signed)
Pt was seen in the ED and found out he fractured his left foot. He continued to work while it was fractured and is now in much greater pain. He would like to be referred to an Orthopedic.

## 2017-07-25 ENCOUNTER — Ambulatory Visit: Payer: Medicare Other | Admitting: Family Medicine

## 2017-10-02 ENCOUNTER — Ambulatory Visit (INDEPENDENT_AMBULATORY_CARE_PROVIDER_SITE_OTHER): Payer: Medicare Other | Admitting: Family Medicine

## 2017-10-02 VITALS — BP 125/70 | HR 73 | Temp 98.2°F | Wt 178.4 lb

## 2017-10-02 DIAGNOSIS — K219 Gastro-esophageal reflux disease without esophagitis: Secondary | ICD-10-CM | POA: Diagnosis not present

## 2017-10-02 DIAGNOSIS — R7303 Prediabetes: Secondary | ICD-10-CM | POA: Diagnosis not present

## 2017-10-02 DIAGNOSIS — R079 Chest pain, unspecified: Secondary | ICD-10-CM

## 2017-10-02 DIAGNOSIS — E785 Hyperlipidemia, unspecified: Secondary | ICD-10-CM | POA: Diagnosis not present

## 2017-10-02 DIAGNOSIS — I10 Essential (primary) hypertension: Secondary | ICD-10-CM

## 2017-10-02 LAB — POCT GLYCOSYLATED HEMOGLOBIN (HGB A1C): HbA1c, POC (controlled diabetic range): 6 % (ref 0.0–7.0)

## 2017-10-02 MED ORDER — ATORVASTATIN CALCIUM 20 MG PO TABS: 20.0000 mg | ORAL_TABLET | Freq: Every day | ORAL | 0 refills | Status: DC

## 2017-10-02 MED ORDER — OMEPRAZOLE 20 MG PO CPDR: 20.0000 mg | DELAYED_RELEASE_CAPSULE | Freq: Every day | ORAL | 3 refills | Status: DC

## 2017-10-02 NOTE — Patient Instructions (Addendum)
It was great to meet you today! Thank you for letting me participate in your care!  Today, we discussed your chest pain which is atypical in nature. However, you do have high blood pressure and high cholesterol which are risk factors for heart disease. Also, your symptoms can be consistent with heart disease so I will refer you to Cardiology today.   I have restarted your Atorvastatin today and I have started you on omeprazole for your GERD.  Please take these as instructed.  Be well, Harolyn Rutherford, DO PGY-2, [facility]

## 2017-10-02 NOTE — Assessment & Plan Note (Signed)
Last lipid panel had LDL over goal of 100. Patient states he has not been taking Atorvastatin. I am refilling his Atorvastatin today and rechecking lipid panel today as well.  Discussed importance of reducing cholesterol as a risk factor for heart disease, especially in the setting of new chest pain.

## 2017-10-02 NOTE — Assessment & Plan Note (Signed)
Started patient on omeprazole 20mg  daily for suspected heart burn.

## 2017-10-02 NOTE — Assessment & Plan Note (Addendum)
Patient is experiencing chest pain that is atypical but does have multiple risk factors for cardiovascular disease (smokes marijuana, has HLD and not taking his medication). Will refer to cardiology for further assessment and work up. May benefit from another stress test as his symptoms have changed since his last work up in 2018 which was a negative stress test.

## 2017-10-02 NOTE — Assessment & Plan Note (Signed)
Patient BP at goal today and states he is still taking his Lisinopril-HCTZ combination pill daily. Will obtain BMP today to check potassium and renal function.

## 2017-10-02 NOTE — Assessment & Plan Note (Signed)
Recheck HgbA1c today as it has not been checked in one year.

## 2017-10-02 NOTE — Progress Notes (Signed)
Subjective: Chief Complaint  Patient presents with  . chest pains    post eating or drinking certain items    HPI: Adam Burton is a 67 y.o. presenting to clinic today to discuss the following:  Referral to Cardiology request Patient is endorsing 2-3 weeks of post prandial pain in the left side of his chest that radiates down his arm. He describes the pain as a heaviness and pressure, not burning or sharp. He is not having any abdominal pain. He also notices it if he drinks an alcoholic beverage. It occurs 2-3 times per day and lasts approximately 15 minutes in duration. Patient states he has been told he has GERD in the past but is not taking any medication for it. He also states this pain is different from his previous "heart burn type pain". The pain does not occur at rest and does not occur with activity however patient is not active. He has HTN and HLD and has not been taking his cholesterol medication.  No fever, chills, abdominal pain, nausea, vomiting, diarrhea, constipation or blood in stool.  Health Maintenance: none     ROS noted in HPI.   Past Medical, Surgical, Social, and Family History Reviewed & Updated per EMR.   Pertinent Historical Findings include:   Social History   Tobacco Use  Smoking Status Never Smoker  Smokeless Tobacco Never Used    Objective: BP 125/70 (BP Location: Right Arm, Patient Position: Sitting, Cuff Size: Normal)   Pulse 73   Temp 98.2 F (36.8 C) (Oral)   Wt 178 lb 6.4 oz (80.9 kg)   SpO2 100%   BMI 24.88 kg/m  Vitals and nursing notes reviewed  Physical Exam Gen: Alert and Oriented x 3, NAD HEENT: Normocephalic, atraumatic, PERRLA, EOMI Neck: trachea midline, no thyroidmegaly, no LAD CV: RRR, no murmurs, normal S1, S2 split Resp: CTAB, no wheezing, rales, or rhonchi, comfortable work of breathing Abd: non-distended, non-tender, soft MSK: Moves all four extremities Ext: no clubbing, cyanosis, or edema Skin: warm, dry,  intact, no rashes  No results found for this or any previous visit (from the past 72 hour(s)).  Assessment/Plan:  Chest pain Patient is experiencing chest pain that is atypical but does have multiple risk factors for cardiovascular disease (smokes marijuana, has HLD and not taking his medication). Will refer to cardiology for further assessment and work up. May benefit from another stress test as his symptoms have changed since his last work up in 2018 which was a negative stress test.   Prediabetes Recheck HgbA1c today as it has not been checked in one year.  Essential hypertension, benign Patient BP at goal today and states he is still taking his Lisinopril-HCTZ combination pill daily. Will obtain BMP today to check potassium and renal function.  Hyperlipidemia Last lipid panel had LDL over goal of 100. Patient states he has not been taking Atorvastatin. I am refilling his Atorvastatin today and rechecking lipid panel today as well.  Discussed importance of reducing cholesterol as a risk factor for heart disease, especially in the setting of new chest pain.  Gastroesophageal reflux disease Started patient on omeprazole 20mg  daily for suspected heart burn.   PATIENT EDUCATION PROVIDED: See AVS    Diagnosis and plan along with any newly prescribed medication(s) were discussed in detail with this patient today. The patient verbalized understanding and agreed with the plan. Patient advised if symptoms worsen return to clinic or ER.   Health Maintainance:   Orders Placed  This Encounter  Procedures  . Lipid Panel  . Basic Metabolic Panel  . Ambulatory referral to Cardiology    Referral Priority:   Routine    Referral Type:   Consultation    Referral Reason:   Specialty Services Required    Requested Specialty:   Cardiology    Number of Visits Requested:   1  . HgB A1c    Meds ordered this encounter  Medications  . atorvastatin (LIPITOR) 20 MG tablet    Sig: Take 1 tablet  (20 mg total) by mouth daily.    Dispense:  90 tablet    Refill:  0  . omeprazole (PRILOSEC) 20 MG capsule    Sig: Take 1 capsule (20 mg total) by mouth daily.    Dispense:  30 capsule    Refill:  [place], DO 10/02/2017, 8:18 AM PGY-2 [place]

## 2017-10-03 LAB — BASIC METABOLIC PANEL
BUN/Creatinine Ratio: 8 — ABNORMAL LOW (ref 10–24)
BUN: 10 mg/dL (ref 8–27)
CO2: 27 mmol/L (ref 20–29)
Calcium: 9.9 mg/dL (ref 8.6–10.2)
Chloride: 101 mmol/L (ref 96–106)
Creatinine, Ser: 1.33 mg/dL — ABNORMAL HIGH (ref 0.76–1.27)
GFR calc Af Amer: 63 mL/min/{1.73_m2} (ref >59)
GFR calc non Af Amer: 55 mL/min/{1.73_m2} — ABNORMAL LOW (ref >59)
Glucose: 102 mg/dL — ABNORMAL HIGH (ref 65–99)
Potassium: 4.9 mmol/L (ref 3.5–5.2)
Sodium: 140 mmol/L (ref 134–144)

## 2017-10-03 LAB — LIPID PANEL
Chol/HDL Ratio: 3.6 ratio (ref 0.0–5.0)
Cholesterol, Total: 228 mg/dL — ABNORMAL HIGH (ref 100–199)
HDL: 63 mg/dL (ref >39)
LDL Calculated: 138 mg/dL — ABNORMAL HIGH (ref 0–99)
Triglycerides: 133 mg/dL (ref 0–149)
VLDL Cholesterol Cal: 27 mg/dL (ref 5–40)

## 2017-10-11 ENCOUNTER — Encounter: Payer: Self-pay | Admitting: Family Medicine

## 2017-10-11 ENCOUNTER — Other Ambulatory Visit: Payer: Self-pay | Admitting: Family Medicine

## 2017-10-11 MED ORDER — ATORVASTATIN CALCIUM 20 MG PO TABS: 20.0000 mg | ORAL_TABLET | Freq: Every day | ORAL | 0 refills | Status: DC

## 2017-10-11 NOTE — Progress Notes (Signed)
Notifying patient via phone that his cholesterol was elevated and I want him on a statin. Sending prescription to the pharmacy.

## 2017-10-11 NOTE — Progress Notes (Signed)
Cont Atorvastatin 

## 2017-10-13 ENCOUNTER — Telehealth: Payer: Self-pay

## 2017-10-13 NOTE — Telephone Encounter (Signed)
Called and informed patient  of his new medication for cholesterol. He had already picked it up from pharmacy.  Informed him to call Dr. Garlan Fillers if questions arose.   Adam Burton, [place]

## 2017-10-15 DIAGNOSIS — E7800 Pure hypercholesterolemia, unspecified: Secondary | ICD-10-CM | POA: Diagnosis not present

## 2017-10-15 DIAGNOSIS — I10 Essential (primary) hypertension: Secondary | ICD-10-CM | POA: Diagnosis not present

## 2017-10-15 DIAGNOSIS — R0789 Other chest pain: Secondary | ICD-10-CM | POA: Diagnosis not present

## 2017-10-24 ENCOUNTER — Encounter

## 2017-12-01 DIAGNOSIS — I10 Essential (primary) hypertension: Secondary | ICD-10-CM | POA: Diagnosis not present

## 2017-12-01 DIAGNOSIS — E7800 Pure hypercholesterolemia, unspecified: Secondary | ICD-10-CM | POA: Diagnosis not present

## 2017-12-01 DIAGNOSIS — R0789 Other chest pain: Secondary | ICD-10-CM | POA: Diagnosis not present

## 2017-12-08 DIAGNOSIS — R0789 Other chest pain: Secondary | ICD-10-CM | POA: Diagnosis not present

## 2017-12-16 DIAGNOSIS — R0789 Other chest pain: Secondary | ICD-10-CM | POA: Diagnosis not present

## 2017-12-16 DIAGNOSIS — R9439 Abnormal result of other cardiovascular function study: Secondary | ICD-10-CM | POA: Diagnosis not present

## 2017-12-16 DIAGNOSIS — E7800 Pure hypercholesterolemia, unspecified: Secondary | ICD-10-CM | POA: Diagnosis not present

## 2017-12-16 DIAGNOSIS — I10 Essential (primary) hypertension: Secondary | ICD-10-CM | POA: Diagnosis not present

## 2017-12-29 DIAGNOSIS — R0789 Other chest pain: Secondary | ICD-10-CM | POA: Diagnosis not present

## 2017-12-29 DIAGNOSIS — I10 Essential (primary) hypertension: Secondary | ICD-10-CM | POA: Diagnosis not present

## 2017-12-29 DIAGNOSIS — R9439 Abnormal result of other cardiovascular function study: Secondary | ICD-10-CM | POA: Diagnosis not present

## 2018-01-05 ENCOUNTER — Telehealth: Payer: Self-pay | Admitting: Cardiology

## 2018-01-05 ENCOUNTER — Other Ambulatory Visit: Payer: Self-pay | Admitting: Cardiology

## 2018-01-05 DIAGNOSIS — R9439 Abnormal result of other cardiovascular function study: Secondary | ICD-10-CM

## 2018-01-05 NOTE — Telephone Encounter (Signed)
Returned call to patient, unsure who called or what this was in regards to.    He states he left them a message on the number they called.   Apologized for the confusion-advised to call back if needed.     offered to schedule ROV as he is due-patient declines at current.

## 2018-01-05 NOTE — Telephone Encounter (Signed)
New Message:    Patient returning call back patient stated that some one called him.

## 2018-01-12 DIAGNOSIS — R9439 Abnormal result of other cardiovascular function study: Secondary | ICD-10-CM | POA: Diagnosis not present

## 2018-01-12 DIAGNOSIS — R0789 Other chest pain: Secondary | ICD-10-CM | POA: Diagnosis not present

## 2018-01-12 DIAGNOSIS — E7800 Pure hypercholesterolemia, unspecified: Secondary | ICD-10-CM | POA: Diagnosis not present

## 2018-01-12 DIAGNOSIS — I10 Essential (primary) hypertension: Secondary | ICD-10-CM | POA: Diagnosis not present

## 2018-01-20 ENCOUNTER — Encounter: Payer: Self-pay | Admitting: Family Medicine

## 2018-01-28 ENCOUNTER — Encounter (HOSPITAL_COMMUNITY): Payer: Self-pay

## 2018-01-28 ENCOUNTER — Ambulatory Visit (HOSPITAL_COMMUNITY)
Admission: RE | Admit: 2018-01-28 | Discharge: 2018-01-28 | Disposition: A | Discharge status: Home / Self Care | Payer: Medicare Other | Source: Ambulatory Visit | Attending: Cardiology | Admitting: Cardiology

## 2018-01-28 DIAGNOSIS — I2510 Atherosclerotic heart disease of native coronary artery without angina pectoris: Secondary | ICD-10-CM | POA: Insufficient documentation

## 2018-01-28 DIAGNOSIS — R9439 Abnormal result of other cardiovascular function study: Secondary | ICD-10-CM | POA: Diagnosis not present

## 2018-01-28 DIAGNOSIS — I700 Atherosclerosis of aorta: Secondary | ICD-10-CM | POA: Insufficient documentation

## 2018-01-28 MED ORDER — METOPROLOL TARTRATE 5 MG/5ML IV SOLN
5.0000 mg | INTRAVENOUS | Status: DC | PRN
  Administered 2018-01-28: 5 mg via INTRAVENOUS
  Filled 2018-01-28: qty 5

## 2018-01-28 MED ORDER — NITROGLYCERIN 0.4 MG SL SUBL
SUBLINGUAL_TABLET | SUBLINGUAL | Status: AC
  Administered 2018-01-28: 0.8 mg via SUBLINGUAL
  Filled 2018-01-28: qty 2

## 2018-01-28 MED ORDER — IOPAMIDOL (ISOVUE-370) INJECTION 76%
100.0000 mL | Freq: Once | INTRAVENOUS | Status: AC | PRN
  Administered 2018-01-28: 100 mL via INTRAVENOUS

## 2018-01-28 MED ORDER — METOPROLOL TARTRATE 5 MG/5ML IV SOLN
INTRAVENOUS | Status: AC
  Administered 2018-01-28: 5 mg via INTRAVENOUS
  Filled 2018-01-28: qty 10

## 2018-01-28 MED ORDER — NITROGLYCERIN 0.4 MG SL SUBL
0.8000 mg | SUBLINGUAL_TABLET | Freq: Once | SUBLINGUAL | Status: AC
  Administered 2018-01-28: 0.8 mg via SUBLINGUAL
  Filled 2018-01-28: qty 25

## 2018-02-10 DIAGNOSIS — I25118 Atherosclerotic heart disease of native coronary artery with other forms of angina pectoris: Secondary | ICD-10-CM | POA: Diagnosis not present

## 2018-02-10 DIAGNOSIS — I10 Essential (primary) hypertension: Secondary | ICD-10-CM | POA: Diagnosis not present

## 2018-02-10 DIAGNOSIS — Z0189 Encounter for other specified special examinations: Secondary | ICD-10-CM | POA: Diagnosis not present

## 2018-03-12 DIAGNOSIS — I2510 Atherosclerotic heart disease of native coronary artery without angina pectoris: Secondary | ICD-10-CM | POA: Diagnosis not present

## 2018-03-12 DIAGNOSIS — I10 Essential (primary) hypertension: Secondary | ICD-10-CM | POA: Diagnosis not present

## 2018-03-12 DIAGNOSIS — E782 Mixed hyperlipidemia: Secondary | ICD-10-CM | POA: Diagnosis not present

## 2018-04-24 ENCOUNTER — Other Ambulatory Visit (HOSPITAL_COMMUNITY)
Admission: RE | Admit: 2018-04-24 | Discharge: 2018-04-24 | Disposition: A | Discharge status: Home / Self Care | Payer: Medicare Other | Source: Ambulatory Visit | Attending: Family Medicine | Admitting: Family Medicine

## 2018-04-24 ENCOUNTER — Ambulatory Visit (INDEPENDENT_AMBULATORY_CARE_PROVIDER_SITE_OTHER): Payer: Medicare Other | Admitting: Family Medicine

## 2018-04-24 ENCOUNTER — Other Ambulatory Visit: Payer: Self-pay

## 2018-04-24 ENCOUNTER — Encounter: Payer: Self-pay | Admitting: Family Medicine

## 2018-04-24 VITALS — BP 156/100 | HR 66 | Temp 97.9°F | Ht 71.0 in | Wt 184.6 lb

## 2018-04-24 DIAGNOSIS — Z0000 Encounter for general adult medical examination without abnormal findings: Secondary | ICD-10-CM

## 2018-04-24 DIAGNOSIS — I10 Essential (primary) hypertension: Secondary | ICD-10-CM

## 2018-04-24 DIAGNOSIS — Z23 Encounter for immunization: Secondary | ICD-10-CM

## 2018-04-24 DIAGNOSIS — Z113 Encounter for screening for infections with a predominantly sexual mode of transmission: Secondary | ICD-10-CM | POA: Insufficient documentation

## 2018-04-24 DIAGNOSIS — F1090 Alcohol use, unspecified, uncomplicated: Secondary | ICD-10-CM

## 2018-04-24 DIAGNOSIS — Z1211 Encounter for screening for malignant neoplasm of colon: Secondary | ICD-10-CM | POA: Diagnosis not present

## 2018-04-24 DIAGNOSIS — R7303 Prediabetes: Secondary | ICD-10-CM

## 2018-04-24 DIAGNOSIS — Z789 Other specified health status: Secondary | ICD-10-CM

## 2018-04-24 DIAGNOSIS — Z7289 Other problems related to lifestyle: Secondary | ICD-10-CM

## 2018-04-24 DIAGNOSIS — K219 Gastro-esophageal reflux disease without esophagitis: Secondary | ICD-10-CM

## 2018-04-24 DIAGNOSIS — F1290 Cannabis use, unspecified, uncomplicated: Secondary | ICD-10-CM

## 2018-04-24 MED ORDER — TETANUS-DIPHTH-ACELL PERTUSSIS 5-2.5-18.5 LF-MCG/0.5 IM SUSP: 0.5000 mL | Freq: Once | INTRAMUSCULAR | 0 refills | Status: AC

## 2018-04-24 MED ORDER — OMEPRAZOLE 20 MG PO CPDR: 20.0000 mg | DELAYED_RELEASE_CAPSULE | Freq: Every day | ORAL | 3 refills | Status: DC

## 2018-04-24 MED ORDER — HYDROCHLOROTHIAZIDE 25 MG PO TABS: 25.0000 mg | ORAL_TABLET | Freq: Every day | ORAL | 0 refills | Status: DC

## 2018-04-24 NOTE — Progress Notes (Signed)
Date of Visit: 04/24/2018   HPI:  Patient presents today for a well adult male exam.   Concerns today: refills of meds Sexual activity: yes, 3-4 male partners in the last year. Does not use condoms every time. STD Screening: agreeable to screening Exercise: not regularly Smoking: no tobacco use Alcohol: 2-3 shots of vodka about every other day. Never has more than 4. No prior problems if he went several days without drinking Drugs: smokes marijuana twice daily. Interested in cutting back. Mood: no concerns Dentist: does not have dentist  Hypertension - not taking any medications regularly. Has an old bottle of lisin-HCTZ that he takes about twice a week but not at all in the last week.  GERD - takes omeprazole daily with good results, needs refill  ROS: See HPI  [place]:  BPH, hypertension, hyperlipidemia, ED, GERD, prediabetes  PHYSICAL EXAM: BP (!) 156/100   Pulse 66   Temp 97.9 F (36.6 C) (Oral)   Ht 5\' 11"  (1.803 m)   Wt 184 lb 9.6 oz (83.7 kg)   SpO2 98%   BMI 25.75 kg/m   Gen: NAD, pleasant, cooperative HEENT: NCAT, PERRL, no palpable thyromegaly or anterior cervical lymphadenopathy. Poor dentition. Heart: RRR, no murmurs Lungs: CTAB, NWOB Abdomen: soft, nontender to palpation Neuro: grossly nonfocal, speech normal Extremities: No appreciable lower extremity edema bilaterally   ASSESSMENT/PLAN:  Health maintenance:  -STD screening: urine gc/chl/trich collected today. Will draw HIV & RPR at lab visit in 1 week -immunizations: PSV23 given today, patient declined flu shot, given Rx for Tdap -lipid screening: current on lipids -colonoscopy: called patient after visit as we did not discuss today, he is agreeable to referral for colonoscopy. Referral placed. -handout given on health maintenance topics  Essential hypertension, benign Uncontrolled, not on blood pressure medications presently. Will add HCTZ 25mg  daily. Follow up in 1 week for RN blood pressure check  and lab visit to check renal function.   Prediabetes Update A1c with upcoming lab visit  Marijuana use Encouraged cessation or at least cutting back.  Gastroesophageal reflux disease Stable, refill omeprazole  Alcohol use Counseled patient to cut back on ETOH use. Recommend limiting to 2 drinks per day. Patient agreeable.   FOLLOW UP: Follow up in 1 week for RN blood pressure check & lab visit  [place] J. Ardelia Mems, [place]

## 2018-04-24 NOTE — Assessment & Plan Note (Addendum)
Uncontrolled, not on blood pressure medications presently. Will add HCTZ 25mg  daily. Follow up in 1 week for RN blood pressure check and lab visit to check renal function.

## 2018-04-24 NOTE — Assessment & Plan Note (Signed)
Encouraged cessation or at least cutting back.

## 2018-04-24 NOTE — Assessment & Plan Note (Signed)
Counseled patient to cut back on ETOH use. Recommend limiting to 2 drinks per day. Patient agreeable.

## 2018-04-24 NOTE — Assessment & Plan Note (Signed)
Stable, refill omeprazole

## 2018-04-24 NOTE — Patient Instructions (Addendum)
Take your blood pressure medication (hydrochlorothiazide 25mg ) every day for a week then return for nurse blood pressure check and lab visit.  Take tetanus shot prescription to your pharmacy Pneumonia shot today   Health Maintenance, Male A healthy lifestyle and preventive care is important for your health and wellness. Ask your health care provider about what schedule of regular examinations is right for you. What should I know about weight and diet? Eat a Healthy Diet  Eat plenty of vegetables, fruits, whole grains, low-fat dairy products, and lean protein.  Do not eat a lot of foods high in solid fats, added sugars, or salt.  Maintain a Healthy Weight Regular exercise can help you achieve or maintain a healthy weight. You should:  Do at least 150 minutes of exercise each week. The exercise should increase your heart rate and make you sweat (moderate-intensity exercise).  Do strength-training exercises at least twice a week. Watch Your Levels of Cholesterol and Blood Lipids  Have your blood tested for lipids and cholesterol every 5 years starting at 70 years of age. If you are at high risk for heart disease, you should start having your blood tested when you are 70 years old. You may need to have your cholesterol levels checked more often if: ? Your lipid or cholesterol levels are high. ? You are older than 70 years of age. ? You are at high risk for heart disease. What should I know about cancer screening? Many types of cancers can be detected early and may often be prevented. Lung Cancer  You should be screened every year for lung cancer if: ? You are a current smoker who has smoked for at least 30 years. ? You are a former smoker who has quit within the past 15 years.  Talk to your health care provider about your screening options, when you should start screening, and how often you should be screened. Colorectal Cancer  Routine colorectal cancer screening usually begins at  70 years of age and should be repeated every 5-10 years until you are 70 years old. You may need to be screened more often if early forms of precancerous polyps or small growths are found. Your health care provider may recommend screening at an earlier age if you have risk factors for colon cancer.  Your health care provider may recommend using home test kits to check for hidden blood in the stool.  A small camera at the end of a tube can be used to examine your colon (sigmoidoscopy or colonoscopy). This checks for the earliest forms of colorectal cancer. Prostate and Testicular Cancer  Depending on your age and overall health, your health care provider may do certain tests to screen for prostate and testicular cancer.  Talk to your health care provider about any symptoms or concerns you have about testicular or prostate cancer. Skin Cancer  Check your skin from head to toe regularly.  Tell your health care provider about any new moles or changes in moles, especially if: ? There is a change in a mole's size, shape, or color. ? You have a mole that is larger than a pencil eraser.  Always use sunscreen. Apply sunscreen liberally and repeat throughout the day.  Protect yourself by wearing long sleeves, pants, a wide-brimmed hat, and sunglasses when outside. What should I know about heart disease, diabetes, and high blood pressure?  If you are 70-34 years of age, have your blood pressure checked every 3-5 years. If you are 40 years of  age or older, have your blood pressure checked every year. You should have your blood pressure measured twice-once when you are at a hospital or clinic, and once when you are not at a hospital or clinic. Record the average of the two measurements. To check your blood pressure when you are not at a hospital or clinic, you can use: ? An automated blood pressure machine at a pharmacy. ? A home blood pressure monitor.  Talk to your health care provider about your  target blood pressure.  If you are between 60-13 years old, ask your health care provider if you should take aspirin to prevent heart disease.  Have regular diabetes screenings by checking your fasting blood sugar level. ? If you are at a normal weight and have a low risk for diabetes, have this test once every three years after the age of 59. ? If you are overweight and have a high risk for diabetes, consider being tested at a younger age or more often.  A one-time screening for abdominal aortic aneurysm (AAA) by ultrasound is recommended for men aged 36-75 years who are current or former smokers. What should I know about preventing infection? Hepatitis B If you have a higher risk for hepatitis B, you should be screened for this virus. Talk with your health care provider to find out if you are at risk for hepatitis B infection. Hepatitis C Blood testing is recommended for:  Everyone born from [birth year] through 1967.  Anyone with known risk factors for hepatitis C. Sexually Transmitted Diseases (STDs)  You should be screened each year for STDs including gonorrhea and chlamydia if: ? You are sexually active and are younger than 70 years of age. ? You are older than 70 years of age and your health care provider tells you that you are at risk for this type of infection. ? Your sexual activity has changed since you were last screened and you are at an increased risk for chlamydia or gonorrhea. Ask your health care provider if you are at risk.  Talk with your health care provider about whether you are at high risk of being infected with HIV. Your health care provider may recommend a prescription medicine to help prevent HIV infection. What else can I do?  Schedule regular health, dental, and eye exams.  Stay current with your vaccines (immunizations).  Do not use any tobacco products, such as cigarettes, chewing tobacco, and e-cigarettes. If you need help quitting, ask your health care  provider.  Limit alcohol intake to no more than 2 drinks per day. One drink equals 12 ounces of beer, 5 ounces of wine, or 1 ounces of hard liquor.  Do not use street drugs.  Do not share needles.  Ask your health care provider for help if you need support or information about quitting drugs.  Tell your health care provider if you often feel depressed.  Tell your health care provider if you have ever been abused or do not feel safe at home. This information is not intended to replace advice given to you by your health care provider. Make sure you discuss any questions you have with your health care provider. Document Released: 08/10/2007 Document Revised: 10/11/2015 Document Reviewed: 11/15/2014 Elsevier Interactive Patient Education  2019 [employer].

## 2018-04-24 NOTE — Assessment & Plan Note (Signed)
Update A1c with upcoming lab visit

## 2018-04-27 LAB — URINE CYTOLOGY ANCILLARY ONLY
Chlamydia: NEGATIVE
Neisseria Gonorrhea: NEGATIVE
Trichomonas: NEGATIVE

## 2018-04-27 NOTE — Addendum Note (Signed)
Addended by: Junious Dresser on: 04/27/2018 12:16 PM   Modules accepted: Orders

## 2018-04-28 ENCOUNTER — Telehealth: Payer: Self-pay

## 2018-04-28 NOTE — Telephone Encounter (Signed)
Called patient and informed him that STD results was negative.  Adam Burton, Adam Burton

## 2018-04-29 NOTE — Addendum Note (Signed)
Addended by: [name] on: 04/29/2018 09:24 AM   Modules accepted: Orders

## 2018-05-01 ENCOUNTER — Other Ambulatory Visit (INDEPENDENT_AMBULATORY_CARE_PROVIDER_SITE_OTHER): Payer: Medicare Other

## 2018-05-01 DIAGNOSIS — R7303 Prediabetes: Secondary | ICD-10-CM

## 2018-05-01 DIAGNOSIS — Z113 Encounter for screening for infections with a predominantly sexual mode of transmission: Secondary | ICD-10-CM

## 2018-05-01 LAB — POCT GLYCOSYLATED HEMOGLOBIN (HGB A1C): Hemoglobin A1C: 6.2 % — AB (ref 4.0–5.6)

## 2018-05-02 LAB — CMP14+EGFR
ALT: 15 IU/L (ref 0–44)
AST: 17 IU/L (ref 0–40)
Albumin/Globulin Ratio: 1.7 (ref 1.2–2.2)
Albumin: 4.5 g/dL (ref 3.8–4.8)
Alkaline Phosphatase: 63 IU/L (ref 39–117)
BUN/Creatinine Ratio: 9 — ABNORMAL LOW (ref 10–24)
BUN: 11 mg/dL (ref 8–27)
Bilirubin Total: 0.5 mg/dL (ref 0.0–1.2)
CO2: 22 mmol/L (ref 20–29)
Calcium: 9.3 mg/dL (ref 8.6–10.2)
Chloride: 102 mmol/L (ref 96–106)
Creatinine, Ser: 1.2 mg/dL (ref 0.76–1.27)
GFR calc Af Amer: 71 mL/min/{1.73_m2} (ref >59)
GFR calc non Af Amer: 61 mL/min/{1.73_m2} (ref >59)
Globulin, Total: 2.6 g/dL (ref 1.5–4.5)
Glucose: 110 mg/dL — ABNORMAL HIGH (ref 65–99)
Potassium: 4.5 mmol/L (ref 3.5–5.2)
Sodium: 140 mmol/L (ref 134–144)
Total Protein: 7.1 g/dL (ref 6.0–8.5)

## 2018-05-02 LAB — RPR: RPR Ser Ql: NONREACTIVE

## 2018-05-02 LAB — HIV ANTIBODY (ROUTINE TESTING W REFLEX): HIV Screen 4th Generation wRfx: NONREACTIVE

## 2018-05-04 ENCOUNTER — Telehealth: Payer: Self-pay

## 2018-05-04 ENCOUNTER — Encounter: Payer: Self-pay | Admitting: Family Medicine

## 2018-05-04 NOTE — Telephone Encounter (Signed)
Pt called stating that isosorbide and causing headaches and making him have nausea. Please review and advise.//ah

## 2018-05-04 NOTE — Telephone Encounter (Signed)
Not sure of the dose he is on, can try taking half a table and see how he does; otherwise, we will have to stop. Ask about his chest pain

## 2018-05-05 NOTE — Telephone Encounter (Signed)
Pt is taking 30mg  qd. He is aware to try half tab. He still has on/off cp but states that when he takes the isosordbide, it helps w/ the cp but then causes the headaches and nausea.//ah

## 2018-05-05 NOTE — Telephone Encounter (Signed)
He is going to cath if he is still having on and off chest pain as we discussed at his last office visit. I can see him and we can talk about the procedure.

## 2018-05-05 NOTE — Telephone Encounter (Signed)
Pt said that if you feel it will help with the pain, then he is ok with having the cath done. He just needs about 2 weeks before it is scheduled.//ah

## 2018-05-06 NOTE — Telephone Encounter (Signed)
I called patient to discuss coronary angiogram; however, patient wishes to come in to the office to further discuss. He is also having headaches despite holding Imdur. Will send message to front staff to contact patient to schedule appt. He states he will not be able to come in for the next few weeks.

## 2018-06-05 ENCOUNTER — Encounter: Payer: Self-pay | Admitting: Cardiology

## 2018-06-05 ENCOUNTER — Other Ambulatory Visit: Payer: Self-pay | Admitting: Cardiology

## 2018-06-05 ENCOUNTER — Telehealth (INDEPENDENT_AMBULATORY_CARE_PROVIDER_SITE_OTHER): Payer: Medicare Other | Admitting: Cardiology

## 2018-06-05 DIAGNOSIS — I209 Angina pectoris, unspecified: Secondary | ICD-10-CM

## 2018-06-05 MED ORDER — METOPROLOL SUCCINATE ER 25 MG PO TB24: 25.0000 mg | ORAL_TABLET | Freq: Every day | ORAL | 2 refills | Status: DC

## 2018-06-05 NOTE — Telephone Encounter (Signed)
Patient called me earlier this morning stating that he had chest discomfort which was relieved with isosorbide mononitrate, but caused him to have severe headache and nausea.  He is now more concerned about exertional chest discomfort that is relieved with rest and also relieved with nitroglycerin and wants to proceed with either coronary angiography or coronary CTA, but states that he is relatively stable as long as he takes it easy and does not exert himself.  I will start him on metoprolol succinate 25 mg daily, advised him if he continues taking isosorbide mononitrate on a daily basis the headache should eventually get better and resolve over the next 3 to 5 days.  Also advised him that it is safe for him to take Tylenol 20 to 30 minutes prior to taking isosorbide.  If his symptoms do not resolve or he continues to have rest pain, he will contact us immediately.  He strength avoid going to the emergency room, advised him that this should not prevent him from going to the ED if he has rest pain or chest pain is not relieved with isosorbide mononitrate.  This was a 15-minute telephone encounter.  Metoprolol succinate 25 mg dose sent to the pharmacy.

## 2018-06-11 ENCOUNTER — Ambulatory Visit (INDEPENDENT_AMBULATORY_CARE_PROVIDER_SITE_OTHER): Payer: Medicare Other | Admitting: Cardiology

## 2018-06-11 ENCOUNTER — Encounter: Payer: Self-pay | Admitting: Cardiology

## 2018-06-11 ENCOUNTER — Other Ambulatory Visit: Payer: Self-pay

## 2018-06-11 VITALS — Ht 71.0 in | Wt 176.0 lb

## 2018-06-11 DIAGNOSIS — E782 Mixed hyperlipidemia: Secondary | ICD-10-CM

## 2018-06-11 DIAGNOSIS — F1290 Cannabis use, unspecified, uncomplicated: Secondary | ICD-10-CM

## 2018-06-11 DIAGNOSIS — I25118 Atherosclerotic heart disease of native coronary artery with other forms of angina pectoris: Secondary | ICD-10-CM | POA: Diagnosis not present

## 2018-06-11 DIAGNOSIS — I10 Essential (primary) hypertension: Secondary | ICD-10-CM | POA: Diagnosis not present

## 2018-06-11 MED ORDER — AMLODIPINE BESYLATE 5 MG PO TABS: 5.0000 mg | ORAL_TABLET | Freq: Every day | ORAL | 1 refills | Status: DC

## 2018-06-11 NOTE — Progress Notes (Signed)
Subjective:   Adam Burton, male    DOB: [birth year], 67 y.o.   MRN: [id number]  [name], MD:  Chief Complaint  Patient presents with  . Coronary Artery Disease  . Hyperlipidemia  . Follow-up    3 mth   This visit type was conducted due to national recommendations for restrictions regarding the COVID-19 Pandemic (e.g. social distancing).  This format is felt to be most appropriate for this patient at this time.  All issues noted in this document were discussed and addressed.  No physical exam was performed (except for noted visual exam findings with Telehealth visits).  The patient has consented to conduct a Telehealth visit and understands insurance will be billed.   I discussed the limitations of evaluation and management by telemedicine and the availability of in person appointments. The patient expressed understanding and agreed to proceed.  Virtual Visit via Video Note is as below  I connected with Adam Burton, on 06/11/18 at 1330 by telephone and verified that I am speaking with the correct person using two identifiers.     I have discussed with her regarding the safety during COVID Pandemic and steps and precautions including social distancing with the patient.    HPI: Adam Burton  is a 68 y.o. male  with hypertension, hyperlipidemia, abnormal nuclear stress test in Oct 2019, and underwent coronary CTA on 01/28/2018 revealing LAD proximal noncalcific greater than 70% stenosis, FFR 0.75, mid RCA moderate 50-69% stenosis, FFR 0.85. Coronary calcium score: 21.  Coronary angiogram was recommended; however, since being on medical therapy, he was asymptomatic and wished to continue with medical management for now.   He was last seen in Jan 2020; however, recently has called our office twice for chest pain and was encouraged to make an appointment. He does admit to not taking all of his medications daily. He has only occasionally taken metoprolol, takes statin  every other day, and is not taking amlodipine. He was previously on Imdur; however, during the interim developed headache and nausea and has stopped taking this except he did take 1 dose the other day for chest pain. Chest pain did improve, but also had headache and nausea. He does continue to have exertional chest pain with occasional radiation to his left arm that improves with resting. He is avoiding activities as he knows that this will cause chest pain. He denies any shortness of breath or leg swelling.   Current marijuana smoker of approximatley 1 blunt per day. No cigarette use. Ocassional alcohol use. Patient is orginially from [place] and moved to the [place] in 1960's.   Past Medical History:  Diagnosis Date  . Hypercholesteremia   . Hypertension   . Renal insufficiency 08/21/2012    Past Surgical History:  Procedure Laterality Date  . CARDIAC CATHETERIZATION  01/2009   Dr. [place]. Normal coronary arteries. Normal LV function.  [name] HERNIA REPAIR    . NM MYOVIEW LTD  12/2008   Potential small area of mild ischemia in the basal inferoseptal wall. Otherwise normal. --> False positive test based on coronary angiography  . Stress Echocardiogram  12/2013   Exercise for just over 4 minutes. Heart rate increased to 148 BPM. Hypertensive response to exercise. EF 60%. No evidence of ischemia. NORMAL STUDY  . TRANSTHORACIC ECHOCARDIOGRAM  11/2013   EF 55-60%. No regional wall motion abnormality is. Normal thickness. Mild LA dilation.    Family History  Problem Relation Age of Onset  . Heart attack  Mother   . Diabetes Mother   . Diabetes Sister   . Diabetes Maternal Grandmother   . Heart attack Maternal Grandmother   . Diabetes Maternal Aunt   . Colon cancer Neg Hx   . Colon polyps Neg Hx   . Esophageal cancer Neg Hx   . Rectal cancer Neg Hx   . Stomach cancer Neg Hx     Social History   Socioeconomic History  . Marital status: Widowed    Spouse name: Not on file  . Number of  children: 3  . Years of education: Not on file  . Highest education level: Not on file  Occupational History  . Not on file  Social Needs  . Financial resource strain: Not on file  . Food insecurity:    Worry: Not on file    Inability: Not on file  . Transportation needs:    Medical: Not on file    Non-medical: Not on file  Tobacco Use  . Smoking status: Never Smoker  . Smokeless tobacco: Never Used  Substance and Sexual Activity  . Alcohol use: Yes    Comment: beer or vodka occasionally  . Drug use: Yes    Types: Marijuana    Comment: 3-6 X per week  . Sexual activity: Yes  Lifestyle  . Physical activity:    Days per week: Not on file    Minutes per session: Not on file  . Stress: Not on file  Relationships  . Social connections:    Talks on phone: Not on file    Gets together: Not on file    Attends religious service: Not on file    Active member of club or organization: Not on file    Attends meetings of clubs or organizations: Not on file    Relationship status: Not on file  . Intimate partner violence:    Fear of current or ex partner: Not on file    Emotionally abused: Not on file    Physically abused: Not on file    Forced sexual activity: Not on file  Other Topics Concern  . Not on file  Social History Narrative   He is a widowed father of 56, grandfather of 3. He lives with his current girlfriend. 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    Current Meds  Medication Sig  . aspirin 81 MG tablet Take 81 mg by mouth every other day.   [name] atorvastatin (LIPITOR) 20 MG tablet Take 1 tablet (20 mg total) by mouth daily.  . hydrochlorothiazide (HYDRODIURIL) 25 MG tablet Take 1 tablet (25 mg total) by mouth daily.  [name] lisinopril (ZESTRIL) 20 MG tablet Take 20 mg by  mouth daily.  . metoprolol succinate (TOPROL-XL) 25 MG 24 hr tablet Take 1 tablet (25 mg total) by mouth daily. Take with or immediately following a meal.  . nitroGLYCERIN (NITROSTAT) 0.4 MG SL tablet Place 0.4 mg under the tongue every 5 (five) minutes as needed for chest pain.  [name] omeprazole (PRILOSEC) 20 MG capsule Take 1 capsule (20 mg total) by mouth daily.     Review of Systems  Constitution: Negative for decreased appetite, malaise/fatigue, weight gain and weight loss.  Eyes: Negative for visual disturbance.  Cardiovascular: Positive for chest pain. Negative  for claudication, dyspnea on exertion, leg swelling, orthopnea, palpitations and syncope.  Respiratory: Negative for hemoptysis and wheezing.   Endocrine: Negative for cold intolerance and heat intolerance.  Hematologic/Lymphatic: Does not bruise/bleed easily.  Skin: Negative for nail changes.  Musculoskeletal: Negative for muscle weakness and myalgias.  Gastrointestinal: Negative for abdominal pain, change in bowel habit, nausea and vomiting.  Neurological: Negative for difficulty with concentration, dizziness, focal weakness and headaches.  Psychiatric/Behavioral: Negative for altered mental status and suicidal ideas.  All other systems reviewed and are negative.      Objective:     Height 5\' 11"  (1.803 m), weight 176 lb (79.8 kg).  Cardiac studies:  Coronary CTA 01/28/2018: Left main normal, normal ramus and circumflex. LAD proximal noncalcific greater than 70% stenosis, FFR 0.75, mid RCA moderate 50-69% stenosis, FFR 0.85. Coronary calcium score: 21. Recommend cardiac catheterization.  Exercise myoview stress 12/08/2017: 1. Resting EKG demonstrates normal sinus rhythm, LVH with nonspecific inferolateral T-wave abnormality. Stress EKG is equivocal for ischemia with 2 mm upsloping ST segment depression noted in inferior and lateral leads that normalized at 2 minutes into recovery. Stress symptoms included chest tightness.  resting blood pressure 140/96 and peak blood pressure 134/94 mmHg. Patient exercised for 6:54 minutes and achieved 8.43 Mets. Stress terminated due to fatigue and 88% of MPHR achieved. 2. The left ventricle was mildly dilated, both in rest and stress images at 124 mL. Stress and rest SPECT images demonstrate homogeneous tracer distribution throughout the myocardium. Gated SPECT imaging reveals global hypokinesis with EF calculated at 36%. 3. This is a high risk study, in view of abnormal EKG response, chest pain and reduced EF.   Echocardiogram 12/29/2017: Left ventricle cavity is normal in size. Mild concentric hypertrophy of the left ventricle. Low normal decrease in global wall motion. Normal diastolic filling pattern. Left ventricle regional wall motion findings: No wall motion abnormalities. Visual EF is 50-55%. Calculated EF 56%. Trileaflet aortic valve with no regurgitation noted. Mild aortic valve leaflet calcification. Structurally normal mitral valve with trace regurgitation. Structurally normal tricuspid valve with trace regurgitation.  Recent Labs: CMP     Component Value Date/Time   NA 140 05/01/2018 0854   K 4.5 05/01/2018 0854   CL 102 05/01/2018 0854   CO2 22 05/01/2018 0854   GLUCOSE 110 (H) 05/01/2018 0854   GLUCOSE 79 10/10/2015 1608   BUN 11 05/01/2018 0854   CREATININE 1.20 05/01/2018 0854   CREATININE 1.34 (H) 10/10/2015 1608   CALCIUM 9.3 05/01/2018 0854   PROT 7.1 05/01/2018 0854   ALBUMIN 4.5 05/01/2018 0854   AST 17 05/01/2018 0854   ALT 15 05/01/2018 0854   ALKPHOS 63 05/01/2018 0854   BILITOT 0.5 05/01/2018 0854   GFRNONAA 61 05/01/2018 0854   GFRNONAA 55 (L) 10/10/2015 1608   GFRAA 71 05/01/2018 0854   GFRAA 63 10/10/2015 1608   Lipid Panel     Component Value Date/Time   CHOL 228 (H) 10/02/2017 0903   TRIG 133 10/02/2017 0903   HDL 63 10/02/2017 0903   CHOLHDL 3.6 10/02/2017 0903   CHOLHDL 5.9 (H) 11/01/2014 1026   VLDL 23 11/01/2014 1026    LDLCALC 138 (H) 10/02/2017 0903   LDLDIRECT 84 09/03/2011 0930    *Physical exam not performed as this is a telephone visit*      Assessment & Recommendations:  1. Coronary artery disease of native artery of native heart with stable angina pectoris [facility]) He continues to have class 3 symptoms of angina and  is avoiding activities due to chest pain. Has abnormal coronary CTA. He is unable to tolerate Imdur due to side effects. As he continues to have symptoms despite medical therapy, feel that his best option is to pursue coronary angiogram. Schedule for cardiac catheterization, and possible angioplasty. We discussed regarding risks, benefits, alternatives to this including stress testing, CTA and continued medical therapy. Patient wants to proceed. Understands <1-2% risk of death, stroke, MI, urgent CABG, bleeding, infection, renal failure but not limited to these. I have again gone through his medications with him and urged daily compliance on all medications as prescribed. Currently on ASA, statin, beta blocker, and ACE inhibitor. I have refilled his amlodipine that he was previously on to help with his chest pain as well as encouraged him to use nitroglycerin as needed for chest pain not resolved with resting.   2. Essential hypertension Has been elevated in the past. Unable to evaluate today as he does not have a cuff at home. Continue with current medical therapy as stated above.   3. Mixed hyperlipidemia He is on Lipitor. He will need repeat lipids as this has not been followed up on since being on lipitor.   4. Marijuana use Encouraged cessation as we do not know long term effects.    Plan: I will arrange for coronary angiogram in the next few weeks and follow up after that. If he is not able to have cath performed in the next couple of weeks, I would like to see him in 3 weeks either in office or virtually depending upon COVID situation.   Jeri Lager, MSN, APRN, FNP-C [facility], [place] Office: [phone] Fax: [phone]

## 2018-07-02 ENCOUNTER — Ambulatory Visit (INDEPENDENT_AMBULATORY_CARE_PROVIDER_SITE_OTHER): Payer: Medicare Other | Admitting: Cardiology

## 2018-07-02 ENCOUNTER — Ambulatory Visit: Payer: Medicare Other | Admitting: Cardiology

## 2018-07-02 ENCOUNTER — Other Ambulatory Visit: Payer: Self-pay

## 2018-07-02 VITALS — Ht 71.0 in | Wt 175.0 lb

## 2018-07-02 DIAGNOSIS — F1290 Cannabis use, unspecified, uncomplicated: Secondary | ICD-10-CM

## 2018-07-02 DIAGNOSIS — E782 Mixed hyperlipidemia: Secondary | ICD-10-CM

## 2018-07-02 DIAGNOSIS — I25118 Atherosclerotic heart disease of native coronary artery with other forms of angina pectoris: Secondary | ICD-10-CM

## 2018-07-02 DIAGNOSIS — I10 Essential (primary) hypertension: Secondary | ICD-10-CM | POA: Diagnosis not present

## 2018-07-02 NOTE — Progress Notes (Signed)
Subjective:   Adam Burton, male    DOB: [birth year], 68 y.o.   MRN: [id number]  Adam Rio, MD:  Chief Complaint  Patient presents with  . Chest Pain   This visit type was conducted due to national recommendations for restrictions regarding the COVID-19 Pandemic (e.g. social distancing).  This format is felt to be most appropriate for this patient at this time.  All issues noted in this document were discussed and addressed.  No physical exam was performed (except for noted visual exam findings with Telehealth visits).  The patient has consented to conduct a Telehealth visit and understands insurance will be billed.   I discussed the limitations of evaluation and management by telemedicine and the availability of in person appointments. The patient expressed understanding and agreed to proceed.  Virtual Visit via Video Note is as below  I connected with Mr. Ederer, on 07/02/18 at 1500 by telephone and verified that I am speaking with the correct person using two identifiers.     I have discussed with her regarding the safety during COVID Pandemic and steps and precautions including social distancing with the patient.    HPI: Adam Burton  is a 68 y.o. male  with hypertension, hyperlipidemia, abnormal nuclear stress test in Oct 2019, and underwent coronary CTA on 01/28/2018 revealing LAD proximal noncalcific greater than 70% stenosis, FFR 0.75, mid RCA moderate 50-69% stenosis, FFR 0.85. Coronary calcium score: 21.  Coronary angiogram was recommended has been recommended; however, patient wished to hold off in view of COVID.  He had recently had worsening chest pain; however, was found to not be taking all of his medications. He is now on his medications and has slight improvement in chest pain. He has not been very active lately as he knows he will have chest pain with this.   Current marijuana smoker of approximatley 1 blunt per day. No cigarette use. Ocassional  alcohol use. Patient is orginially from [place] and moved to the [place] in 1960's.   Past Medical History:  Diagnosis Date  . Hypercholesteremia   . Hypertension   . Renal insufficiency 08/21/2012    Past Surgical History:  Procedure Laterality Date  . CARDIAC CATHETERIZATION  01/2009   Dr. Martinique. Normal coronary arteries. Normal LV function.  [name] HERNIA REPAIR    . NM MYOVIEW LTD  12/2008   Potential small area of mild ischemia in the basal inferoseptal wall. Otherwise normal. --> False positive test based on coronary angiography  . Stress Echocardiogram  12/2013   Exercise for just over 4 minutes. Heart rate increased to 148 BPM. Hypertensive response to exercise. EF 60%. No evidence of ischemia. NORMAL STUDY  . TRANSTHORACIC ECHOCARDIOGRAM  11/2013   EF 55-60%. No regional wall motion abnormality is. Normal thickness. Mild LA dilation.    Family History  Problem Relation Age of Onset  . Heart attack Mother   . Diabetes Mother   . Diabetes Sister   . Diabetes Maternal Grandmother   . Heart attack Maternal Grandmother   . Diabetes Maternal Aunt   . Colon cancer Neg Hx   . Colon polyps Neg Hx   . Esophageal cancer Neg Hx   . Rectal cancer Neg Hx   . Stomach cancer Neg Hx     Social History   Socioeconomic History  . Marital status: Widowed    Spouse name: Not on file  . Number of children: 3  . Years of education: Not on file  .  Highest education level: Not on file  Occupational History  . Not on file  Social Needs  . Financial resource strain: Not on file  . Food insecurity:    Worry: Not on file    Inability: Not on file  . Transportation needs:    Medical: Not on file    Non-medical: Not on file  Tobacco Use  . Smoking status: Never Smoker  . Smokeless tobacco: Never Used  Substance and Sexual Activity  . Alcohol use: Yes    Comment: beer or vodka occasionally  . Drug use: Yes    Types: Marijuana    Comment: 3-6 X per week  . Sexual activity: Yes   Lifestyle  . Physical activity:    Days per week: Not on file    Minutes per session: Not on file  . Stress: Not on file  Relationships  . Social connections:    Talks on phone: Not on file    Gets together: Not on file    Attends religious service: Not on file    Active member of club or organization: Not on file    Attends meetings of clubs or organizations: Not on file    Relationship status: Not on file  . Intimate partner violence:    Fear of current or ex partner: Not on file    Emotionally abused: Not on file    Physically abused: Not on file    Forced sexual activity: Not on file  Other Topics Concern  . Not on file  Social History Narrative   He is a widowed father of 1, grandfather of 3. He lives with his current girlfriend. 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    Current Meds  Medication Sig  . amLODipine (NORVASC) 5 MG tablet Take 1 tablet (5 mg total) by mouth daily.  [name] aspirin 81 MG tablet Take 81 mg by mouth every other day.   [name] atorvastatin (LIPITOR) 20 MG tablet Take 1 tablet (20 mg total) by mouth daily.  . hydrochlorothiazide (HYDRODIURIL) 25 MG tablet Take 1 tablet (25 mg total) by mouth daily.  . isosorbide mononitrate (IMDUR) 30 MG 24 hr tablet Take 30 mg by mouth daily.  [name] lisinopril (ZESTRIL) 20 MG tablet Take 20 mg by mouth daily.  . metoprolol succinate (TOPROL-XL) 25 MG 24 hr tablet Take 1 tablet (25 mg total) by mouth daily. Take with or immediately following a meal.  . nitroGLYCERIN (NITROSTAT) 0.4 MG SL tablet Place 0.4 mg under the tongue every 5 (five) minutes as needed for chest pain.  [name] omeprazole (PRILOSEC) 20 MG capsule Take 1 capsule (20 mg total) by mouth daily.     Review of Systems  Constitution: Negative for decreased appetite,  malaise/fatigue, weight gain and weight loss.  Eyes: Negative for visual disturbance.  Cardiovascular: Positive for chest pain. Negative for claudication, dyspnea on exertion, leg swelling, orthopnea, palpitations and syncope.  Respiratory: Negative for hemoptysis and wheezing.   Endocrine: Negative for cold intolerance and heat intolerance.  Hematologic/Lymphatic: Does not bruise/bleed easily.  Skin: Negative for nail changes.  Musculoskeletal: Negative for muscle weakness and myalgias.  Gastrointestinal: Negative for abdominal pain, change in bowel habit, nausea and vomiting.  Neurological: Negative for difficulty with concentration, dizziness, focal weakness and  headaches.  Psychiatric/Behavioral: Negative for altered mental status and suicidal ideas.  All other systems reviewed and are negative.      Objective:     Height 5\' 11"  (1.803 m), weight 175 lb (79.4 kg).  Cardiac studies:  Coronary CTA 01/28/2018: Left main normal, normal ramus and circumflex. LAD proximal noncalcific greater than 70% stenosis, FFR 0.75, mid RCA moderate 50-69% stenosis, FFR 0.85. Coronary calcium score: 21. Recommend cardiac catheterization.  Exercise myoview stress 12/08/2017: 1. Resting EKG demonstrates normal sinus rhythm, LVH with nonspecific inferolateral T-wave abnormality. Stress EKG is equivocal for ischemia with 2 mm upsloping ST segment depression noted in inferior and lateral leads that normalized at 2 minutes into recovery. Stress symptoms included chest tightness. resting blood pressure 140/96 and peak blood pressure 134/94 mmHg. Patient exercised for 6:54 minutes and achieved 8.43 Mets. Stress terminated due to fatigue and 88% of MPHR achieved. 2. The left ventricle was mildly dilated, both in rest and stress images at 124 mL. Stress and rest SPECT images demonstrate homogeneous tracer distribution throughout the myocardium. Gated SPECT imaging reveals global hypokinesis with EF calculated at  36%. 3. This is a high risk study, in view of abnormal EKG response, chest pain and reduced EF.   Echocardiogram 12/29/2017: Left ventricle cavity is normal in size. Mild concentric hypertrophy of the left ventricle. Low normal decrease in global wall motion. Normal diastolic filling pattern. Left ventricle regional wall motion findings: No wall motion abnormalities. Visual EF is 50-55%. Calculated EF 56%. Trileaflet aortic valve with no regurgitation noted. Mild aortic valve leaflet calcification. Structurally normal mitral valve with trace regurgitation. Structurally normal tricuspid valve with trace regurgitation.  Recent Labs: CMP     Component Value Date/Time   NA 140 05/01/2018 0854   K 4.5 05/01/2018 0854   CL 102 05/01/2018 0854   CO2 22 05/01/2018 0854   GLUCOSE 110 (H) 05/01/2018 0854   GLUCOSE 79 10/10/2015 1608   BUN 11 05/01/2018 0854   CREATININE 1.20 05/01/2018 0854   CREATININE 1.34 (H) 10/10/2015 1608   CALCIUM 9.3 05/01/2018 0854   PROT 7.1 05/01/2018 0854   ALBUMIN 4.5 05/01/2018 0854   AST 17 05/01/2018 0854   ALT 15 05/01/2018 0854   ALKPHOS 63 05/01/2018 0854   BILITOT 0.5 05/01/2018 0854   GFRNONAA 61 05/01/2018 0854   GFRNONAA 55 (L) 10/10/2015 1608   GFRAA 71 05/01/2018 0854   GFRAA 63 10/10/2015 1608   Lipid Panel     Component Value Date/Time   CHOL 228 (H) 10/02/2017 0903   TRIG 133 10/02/2017 0903   HDL 63 10/02/2017 0903   CHOLHDL 3.6 10/02/2017 0903   CHOLHDL 5.9 (H) 11/01/2014 1026   VLDL 23 11/01/2014 1026   LDLCALC 138 (H) 10/02/2017 0903   LDLDIRECT 84 09/03/2011 0930    *Physical exam not performed as this is a telephone visit*      Assessment & Recommendations:  1. Coronary artery disease of native artery of native heart with stable angina pectoris [facility]) He continues to have class 2-3 symptoms of angina and is avoiding activities due to chest pain. Has abnormal coronary CTA. He is now compliant with medications and has had  slight improvement in chest pain; however, is still avoiding activities to avoid chest pain. I continue to feel that he will need coronary angiogram. He is agreeable to this to be done in a few weeks. Schedule for cardiac catheterization, and possible angioplasty. We discussed regarding risks, benefits, alternatives to this including  stress testing, CTA and continued medical therapy. Patient wants to proceed. Understands <1-2% risk of death, stroke, MI, urgent CABG, bleeding, infection, renal failure but not limited to these. Urged continued compliance with medications. Currently on ASA, statin, beta blocker, and ACE inhibitor.   2. Essential hypertension Has been elevated in the past. Unable to evaluate today as he does not have a cuff at home. Continue with current medical therapy as stated above.   3. Mixed hyperlipidemia Continue with Lipitor. Will add lipid panel to pre-procedure labs as this needs to be followed up.   4. Marijuana use Encouraged cessation as we do not know long term effects.    Plan: I will plan to see him back after the procedure, but encouraged him to contact me sooner if needed.   Jeri Lager, MSN, APRN, FNP-C [facility], [place] Office: [phone] Fax: [phone]

## 2018-07-03 ENCOUNTER — Encounter: Payer: Self-pay | Admitting: Cardiology

## 2018-07-17 ENCOUNTER — Other Ambulatory Visit: Payer: Self-pay | Admitting: Cardiology

## 2018-07-17 DIAGNOSIS — I2510 Atherosclerotic heart disease of native coronary artery without angina pectoris: Secondary | ICD-10-CM

## 2018-07-17 DIAGNOSIS — I25118 Atherosclerotic heart disease of native coronary artery with other forms of angina pectoris: Secondary | ICD-10-CM

## 2018-07-27 DIAGNOSIS — I25118 Atherosclerotic heart disease of native coronary artery with other forms of angina pectoris: Secondary | ICD-10-CM | POA: Diagnosis not present

## 2018-07-27 LAB — CBC
Hematocrit: 42.4 % (ref 37.5–51.0)
Hemoglobin: 14.8 g/dL (ref 13.0–17.7)
MCH: 30.3 pg (ref 26.6–33.0)
MCHC: 34.9 g/dL (ref 31.5–35.7)
MCV: 87 fL (ref 79–97)
Platelets: 308 10*3/uL (ref 150–450)
RBC: 4.89 x10E6/uL (ref 4.14–5.80)
RDW: 12.8 % (ref 11.6–15.4)
WBC: 6.7 10*3/uL (ref 3.4–10.8)

## 2018-07-28 LAB — BASIC METABOLIC PANEL
BUN/Creatinine Ratio: 8 — ABNORMAL LOW (ref 10–24)
BUN: 11 mg/dL (ref 8–27)
CO2: 22 mmol/L (ref 20–29)
Calcium: 9.7 mg/dL (ref 8.6–10.2)
Chloride: 99 mmol/L (ref 96–106)
Creatinine, Ser: 1.36 mg/dL — ABNORMAL HIGH (ref 0.76–1.27)
GFR calc Af Amer: 61 mL/min/{1.73_m2} (ref >59)
GFR calc non Af Amer: 53 mL/min/{1.73_m2} — ABNORMAL LOW (ref >59)
Glucose: 110 mg/dL — ABNORMAL HIGH (ref 65–99)
Potassium: 3.9 mmol/L (ref 3.5–5.2)
Sodium: 139 mmol/L (ref 134–144)

## 2018-07-28 LAB — LIPID PANEL
Chol/HDL Ratio: 2.5 ratio (ref 0.0–5.0)
Cholesterol, Total: 143 mg/dL (ref 100–199)
HDL: 58 mg/dL (ref >39)
LDL Calculated: 68 mg/dL (ref 0–99)
Triglycerides: 86 mg/dL (ref 0–149)
VLDL Cholesterol Cal: 17 mg/dL (ref 5–40)

## 2018-07-31 ENCOUNTER — Other Ambulatory Visit (HOSPITAL_COMMUNITY)
Admission: RE | Admit: 2018-07-31 | Discharge: 2018-07-31 | Disposition: A | Discharge status: Home / Self Care | Payer: Medicare Other | Source: Ambulatory Visit | Attending: Cardiology | Admitting: Cardiology

## 2018-07-31 ENCOUNTER — Other Ambulatory Visit: Payer: Self-pay

## 2018-07-31 DIAGNOSIS — Z1159 Encounter for screening for other viral diseases: Secondary | ICD-10-CM | POA: Diagnosis not present

## 2018-07-31 DIAGNOSIS — Z01812 Encounter for preprocedural laboratory examination: Secondary | ICD-10-CM | POA: Insufficient documentation

## 2018-08-01 LAB — NOVEL CORONAVIRUS, NAA (HOSP ORDER, SEND-OUT TO REF LAB; TAT 18-24 HRS): SARS-CoV-2, NAA: NOT DETECTED

## 2018-08-04 ENCOUNTER — Other Ambulatory Visit: Payer: Self-pay

## 2018-08-04 ENCOUNTER — Ambulatory Visit (HOSPITAL_COMMUNITY)
Admission: RE | Admit: 2018-08-04 | Discharge: 2018-08-04 | Disposition: A | Discharge status: Home / Self Care | Payer: Medicare Other | Attending: Cardiology | Admitting: Cardiology

## 2018-08-04 ENCOUNTER — Encounter (HOSPITAL_COMMUNITY)
Admission: RE | Disposition: A | Discharge status: Home / Self Care | Payer: Self-pay | Source: Home / Self Care | Attending: Cardiology

## 2018-08-04 DIAGNOSIS — I25118 Atherosclerotic heart disease of native coronary artery with other forms of angina pectoris: Secondary | ICD-10-CM | POA: Diagnosis not present

## 2018-08-04 DIAGNOSIS — E785 Hyperlipidemia, unspecified: Secondary | ICD-10-CM | POA: Diagnosis not present

## 2018-08-04 DIAGNOSIS — Z79899 Other long term (current) drug therapy: Secondary | ICD-10-CM | POA: Diagnosis not present

## 2018-08-04 DIAGNOSIS — I208 Other forms of angina pectoris: Secondary | ICD-10-CM | POA: Diagnosis present

## 2018-08-04 DIAGNOSIS — I209 Angina pectoris, unspecified: Secondary | ICD-10-CM

## 2018-08-04 DIAGNOSIS — Z8249 Family history of ischemic heart disease and other diseases of the circulatory system: Secondary | ICD-10-CM | POA: Insufficient documentation

## 2018-08-04 DIAGNOSIS — I2510 Atherosclerotic heart disease of native coronary artery without angina pectoris: Secondary | ICD-10-CM

## 2018-08-04 DIAGNOSIS — Z955 Presence of coronary angioplasty implant and graft: Secondary | ICD-10-CM

## 2018-08-04 DIAGNOSIS — I10 Essential (primary) hypertension: Secondary | ICD-10-CM | POA: Diagnosis not present

## 2018-08-04 DIAGNOSIS — Z7982 Long term (current) use of aspirin: Secondary | ICD-10-CM | POA: Insufficient documentation

## 2018-08-04 HISTORY — PX: CORONARY STENT INTERVENTION: CATH118234

## 2018-08-04 HISTORY — PX: CORONARY ANGIOGRAPHY: CATH118303

## 2018-08-04 LAB — POCT ACTIVATED CLOTTING TIME: Activated Clotting Time: 301 seconds

## 2018-08-04 SURGERY — CORONARY ANGIOGRAPHY (CATH LAB)
Anesthesia: LOCAL

## 2018-08-04 MED ORDER — VERAPAMIL HCL 2.5 MG/ML IV SOLN
INTRAVENOUS | Status: DC | PRN
  Administered 2018-08-04: 10 mL via INTRA_ARTERIAL

## 2018-08-04 MED ORDER — ASPIRIN 81 MG PO CHEW
81.0000 mg | CHEWABLE_TABLET | ORAL | Status: AC
  Administered 2018-08-04: 81 mg via ORAL
  Filled 2018-08-04: qty 1

## 2018-08-04 MED ORDER — HEPARIN (PORCINE) IN NACL 1000-0.9 UT/500ML-% IV SOLN
INTRAVENOUS | Status: DC | PRN
  Administered 2018-08-04 (×2): 500 mL

## 2018-08-04 MED ORDER — HEPARIN SODIUM (PORCINE) 1000 UNIT/ML IJ SOLN
INTRAMUSCULAR | Status: AC
  Filled 2018-08-04: qty 1

## 2018-08-04 MED ORDER — MIDAZOLAM HCL 2 MG/2ML IJ SOLN
INTRAMUSCULAR | Status: AC
  Filled 2018-08-04: qty 2

## 2018-08-04 MED ORDER — CLOPIDOGREL BISULFATE 75 MG PO TABS: 75.0000 mg | ORAL_TABLET | Freq: Every day | ORAL | 2 refills | Status: DC

## 2018-08-04 MED ORDER — SODIUM CHLORIDE 0.9 % IV SOLN: INTRAVENOUS | Status: AC

## 2018-08-04 MED ORDER — SODIUM CHLORIDE 0.9 % IV SOLN: 250.0000 mL | INTRAVENOUS | Status: DC | PRN

## 2018-08-04 MED ORDER — ONDANSETRON HCL 4 MG/2ML IJ SOLN: 4.0000 mg | Freq: Four times a day (QID) | INTRAMUSCULAR | Status: DC | PRN

## 2018-08-04 MED ORDER — LIDOCAINE HCL (PF) 1 % IJ SOLN
INTRAMUSCULAR | Status: DC | PRN
  Administered 2018-08-04: 2 mL

## 2018-08-04 MED ORDER — SODIUM CHLORIDE 0.9% FLUSH: 3.0000 mL | INTRAVENOUS | Status: DC | PRN

## 2018-08-04 MED ORDER — SODIUM CHLORIDE 0.9 % WEIGHT BASED INFUSION: 1.0000 mL/kg/h | INTRAVENOUS | Status: DC

## 2018-08-04 MED ORDER — HYDRALAZINE HCL 20 MG/ML IJ SOLN: 10.0000 mg | INTRAMUSCULAR | Status: DC | PRN

## 2018-08-04 MED ORDER — SODIUM CHLORIDE 0.9 % WEIGHT BASED INFUSION
3.0000 mL/kg/h | INTRAVENOUS | Status: AC
  Administered 2018-08-04: 3 mL/kg/h via INTRAVENOUS

## 2018-08-04 MED ORDER — NITROGLYCERIN 1 MG/10 ML FOR IR/CATH LAB
INTRA_ARTERIAL | Status: AC
  Filled 2018-08-04: qty 10

## 2018-08-04 MED ORDER — MIDAZOLAM HCL 2 MG/2ML IJ SOLN
INTRAMUSCULAR | Status: DC | PRN
  Administered 2018-08-04: 2 mg via INTRAVENOUS

## 2018-08-04 MED ORDER — CLOPIDOGREL BISULFATE 300 MG PO TABS
ORAL_TABLET | ORAL | Status: DC | PRN
  Administered 2018-08-04: 600 mg via ORAL

## 2018-08-04 MED ORDER — HEPARIN SODIUM (PORCINE) 1000 UNIT/ML IJ SOLN
INTRAMUSCULAR | Status: DC | PRN
  Administered 2018-08-04 (×2): 4000 [IU] via INTRAVENOUS

## 2018-08-04 MED ORDER — CLOPIDOGREL BISULFATE 75 MG PO TABS: 75.0000 mg | ORAL_TABLET | Freq: Every day | ORAL | Status: DC

## 2018-08-04 MED ORDER — CLOPIDOGREL BISULFATE 300 MG PO TABS
ORAL_TABLET | ORAL | Status: AC
  Filled 2018-08-04: qty 2

## 2018-08-04 MED ORDER — FENTANYL CITRATE (PF) 100 MCG/2ML IJ SOLN
INTRAMUSCULAR | Status: DC | PRN
  Administered 2018-08-04: 50 ug via INTRAVENOUS

## 2018-08-04 MED ORDER — SODIUM CHLORIDE 0.9% FLUSH: 3.0000 mL | Freq: Two times a day (BID) | INTRAVENOUS | Status: DC

## 2018-08-04 MED ORDER — IOHEXOL 350 MG/ML SOLN
INTRAVENOUS | Status: DC | PRN
  Administered 2018-08-04: 120 mL via INTRA_ARTERIAL

## 2018-08-04 MED ORDER — NITROGLYCERIN 1 MG/10 ML FOR IR/CATH LAB
INTRA_ARTERIAL | Status: DC | PRN
  Administered 2018-08-04: 200 ug via INTRACORONARY

## 2018-08-04 MED ORDER — LIDOCAINE HCL (PF) 1 % IJ SOLN
INTRAMUSCULAR | Status: AC
  Filled 2018-08-04: qty 30

## 2018-08-04 MED ORDER — HEPARIN (PORCINE) IN NACL 1000-0.9 UT/500ML-% IV SOLN
INTRAVENOUS | Status: AC
  Filled 2018-08-04: qty 1000

## 2018-08-04 MED ORDER — ACETAMINOPHEN 325 MG PO TABS: 650.0000 mg | ORAL_TABLET | ORAL | Status: DC | PRN

## 2018-08-04 MED ORDER — VERAPAMIL HCL 2.5 MG/ML IV SOLN
INTRAVENOUS | Status: AC
  Filled 2018-08-04: qty 2

## 2018-08-04 MED ORDER — FENTANYL CITRATE (PF) 100 MCG/2ML IJ SOLN
INTRAMUSCULAR | Status: AC
  Filled 2018-08-04: qty 2

## 2018-08-04 MED ORDER — LABETALOL HCL 5 MG/ML IV SOLN: 10.0000 mg | INTRAVENOUS | Status: DC | PRN

## 2018-08-04 MED FILL — CLOPIDOGREL 75 MG TABLET: 75 | 90 days supply | Qty: 90 | Fill #0

## 2018-08-04 SURGICAL SUPPLY — 18 items
BALLN EMERGE MR 3.0X15 (BALLOONS) ×2
BALLN EMERGE MR 3.5X15 (BALLOONS) ×2
BALLOON EMERGE MR 3.0X15 (BALLOONS) IMPLANT
BALLOON EMERGE MR 3.5X15 (BALLOONS) IMPLANT
CATH LAUNCHER 6FR EBU3.5 (CATHETERS) ×1 IMPLANT
CATH OPTITORQUE TIG 4.0 5F (CATHETERS) ×1 IMPLANT
DEVICE RAD COMP TR BAND LRG (VASCULAR PRODUCTS) ×1 IMPLANT
GLIDESHEATH SLEND A-KIT 6F 22G (SHEATH) ×1 IMPLANT
GUIDEWIRE INQWIRE 1.5J.035X260 (WIRE) IMPLANT
INQWIRE 1.5J .035X260CM (WIRE) ×2
KIT ENCORE 26 ADVANTAGE (KITS) ×1 IMPLANT
KIT HEART LEFT (KITS) ×2 IMPLANT
KIT HEMO VALVE WATCHDOG (MISCELLANEOUS) ×1 IMPLANT
PACK CARDIAC CATHETERIZATION (CUSTOM PROCEDURE TRAY) ×2 IMPLANT
STENT RESOLUTE ONYX 3.5X22 (Permanent Stent) ×1 IMPLANT
TRANSDUCER W/STOPCOCK (MISCELLANEOUS) ×2 IMPLANT
TUBING CIL FLEX 10 FLL-RA (TUBING) ×2 IMPLANT
WIRE ASAHI PROWATER 180CM (WIRE) ×1 IMPLANT

## 2018-08-04 NOTE — Progress Notes (Signed)
CARDIAC REHAB PHASE I   1320 - 1425  Ed complete on stent card, NTG usage, Plavix, diet, exercise, restrictions, and infection prevention. Pt referred to CRPII at [facility]. Pt expresses interest in virtual cardiac rehab until normal CRPII resumes. Pt voices understanding regarding all of the information that was presented.   Philis Kendall, MS 08/04/2018 2:17 PM

## 2018-08-04 NOTE — Progress Notes (Signed)
Pt doing well. TRB off without incident/Dr Patwardan in to assess pt.  Report given to [facility]

## 2018-08-04 NOTE — Interval H&P Note (Signed)
History and Physical Interval Note:  08/04/2018 10:57 AM  Adam Burton  has presented today for surgery, with the diagnosis of unstable angina.  The various methods of treatment have been discussed with the patient and family. After consideration of risks, benefits and other options for treatment, the patient has consented to  Procedure(s): LEFT HEART CATH AND CORONARY ANGIOGRAPHY (N/A) as a surgical intervention.  The patient's history has been reviewed, patient examined, no change in status, stable for surgery.  I have reviewed the patient's chart and labs.  Questions were answered to the patient's satisfaction.    2016/2017 Appropriate Use Criteria for Coronary Revascularization Clinical Presentation: Diabetes Mellitus? Symptom Status? S/P CABG? Antianginal Therapy (# of long-acting drugs)? Results of Non-invasive testing? FFR/iFR results in all diseased vessels? Patient undergoing renal transplant? Patient undergoing percutaneous valve procedure (TAVR, MitraClip, Others)? Symptom Status:  Ischemic Symptoms  Non-invasive Testing:  High risk  If no or indeterminate stress test, FFR/iFR results in all diseased vessels:  N/A  Diabetes Mellitus:  No  S/P CABG:  No  Antianginal therapy (number of long-acting drugs):  >=2  Patient undergoing renal transplant:  No  Patient undergoing percutaneous valve procedure:  No    newline 1 Vessel Disease PCI CABG  No proximal LAD involvement, No proximal left dominant LCX involvement A (8); Indication 2 M (6); Indication 2   Proximal left dominant LCX involvement A (8); Indication 5 A (8); Indication 5   Proximal LAD involvement A (8); Indication 5 A (8); Indication 5   newline 2 Vessel Disease  No proximal LAD involvement A (8); Indication 8 A (7); Indication 8   Proximal LAD involvement A (8); Indication 11 A (8); Indication 11   newline 3 Vessel Disease  Low disease complexity (e.g., focal stenoses, SYNTAX <=22) A (8); Indication 17 A (8);  Indication 17   Intermediate or high disease complexity (e.g., SYNTAX >=23) M (6); Indication 21 A (9); Indication 21   newline Left Main Disease  Isolated LMCA disease: ostial or midshaft A (7); Indication 24 A (9); Indication 24   Isolated LMCA disease: bifurcation involvement M (6); Indication 25 A (9); Indication 25   LMCA ostial or midshaft, concurrent low disease burden multivessel disease (e.g., 1-2 additional focal stenoses, SYNTAX <=22) A (7); Indication 26 A (9); Indication 26   LMCA ostial or midshaft, concurrent intermediate or high disease burden multivessel disease (e.g., 1-2 additional bifurcation stenoses, long stenoses, SYNTAX >=23) M (4); Indication 27 A (9); Indication 27   LMCA bifurcation involvement, concurrent low disease burden multivessel disease (e.g., 1-2 additional focal stenoses, SYNTAX <=22) M (6); Indication 28 A (9); Indication 28   LMCA bifurcation involvement, concurrent intermediate or high disease burden multivessel disease (e.g., 1-2 additional bifurcation stenoses, long stenoses, SYNTAX >=23) R (3); Indication 29 A (9); Indication [place]

## 2018-08-04 NOTE — H&P (Signed)
Adam Burton is an 68 y.o. male.   Chief Complaint: Chest pain HPI:   68 y.o. male  with hypertension, hyperlipidemia, abnormal nuclear stress test in Oct 2019, and underwent coronary CTA on 01/28/2018 revealing LAD proximal noncalcific greater than 70% stenosis, FFR 0.75, mid RCA moderate 50-69% stenosis, FFR 0.85.   Given ongoing symptoms on medical therapy, he is here for cardiac catheterization and possible intervention.  Past Medical History:  Diagnosis Date  . Hypercholesteremia   . Hypertension   . Renal insufficiency 08/21/2012    Past Surgical History:  Procedure Laterality Date  . CARDIAC CATHETERIZATION  01/2009   Dr. Martin[place]. Normal coronary arteries. Normal LV function.  [name] HERNIA REPAIR    . NM MYOVIEW LTD  12/2008   Potential small area of mild ischemia in the basal inferoseptal wall. Otherwise normal. --> False positive test based on coronary angiography  . Stress Echocardiogram  12/2013   Exercise for just over 4 minutes. Heart rate increased to 148 BPM. Hypertensive response to exercise. EF 60%. No evidence of ischemia. NORMAL STUDY  . TRANSTHORACIC ECHOCARDIOGRAM  11/2013   EF 55-60%. No regional wall motion abnormality is. Normal thickness. Mild LA dilation.    Family History  Problem Relation Age of Onset  . Heart attack Mother   . Diabetes Mother   . Diabetes Sister   . Diabetes Maternal Grandmother   . Heart attack Maternal Grandmother   . Diabetes Maternal Aunt   . Colon cancer Neg Hx   . Colon polyps Neg Hx   . Esophageal cancer Neg Hx   . Rectal cancer Neg Hx   . Stomach cancer Neg Hx    Social History:  reports that he has never smoked. He has never used smokeless tobacco. He reports current alcohol use. He reports current drug use. Drug: Marijuana.  Allergies: No Known Allergies  Review of Systems  Constitution: Negative for decreased appetite, malaise/fatigue, weight gain and weight loss.  HENT: Negative for congestion.   Eyes: Negative for  visual disturbance.  Cardiovascular: Positive for chest pain. Negative for dyspnea on exertion, leg swelling, palpitations and syncope.  Respiratory: Negative for cough.   Endocrine: Negative for cold intolerance.  Hematologic/Lymphatic: Does not bruise/bleed easily.  Skin: Negative for itching and rash.  Musculoskeletal: Negative for myalgias.  Gastrointestinal: Negative for abdominal pain, nausea and vomiting.  Genitourinary: Negative for dysuria.  Neurological: Negative for dizziness and weakness.  Psychiatric/Behavioral: The patient is not nervous/anxious.   All other systems reviewed and are negative.    Pulse 78, temperature 97.9 F (36.6 C), temperature source Oral, height 5\' 11"  (1.803 m), weight 77.6 kg, SpO2 100 %. Body mass index is 23.85 kg/m.  Physical Exam  Constitutional: He is oriented to person, place, and time. He appears well-developed and well-nourished. No distress.  HENT:  Head: Normocephalic and atraumatic.  Eyes: Pupils are equal, round, and reactive to light. Conjunctivae are normal.  Neck: No JVD present.  Cardiovascular: Normal rate, regular rhythm and intact distal pulses.  Pulmonary/Chest: Effort normal and breath sounds normal. He has no wheezes. He has no rales.  Abdominal: Soft. Bowel sounds are normal. There is no rebound.  Musculoskeletal:        General: No edema.  Lymphadenopathy:    He has no cervical adenopathy.  Neurological: He is alert and oriented to person, place, and time. No cranial nerve deficit.  Skin: Skin is warm and dry.  Psychiatric: He has a normal mood and affect.  Nursing  note and vitals reviewed.   Labs:   Lab Results  Component Value Date   WBC 6.7 07/27/2018   HGB 14.8 07/27/2018   HCT 42.4 07/27/2018   MCV 87 07/27/2018   PLT 308 07/27/2018   No results for input(s): NA, K, CL, CO2, BUN, CREATININE, CALCIUM, PROT, BILITOT, ALKPHOS, ALT, AST, GLUCOSE in the last 168 hours.  Invalid input(s): LABALBU  Lipid  Panel     Component Value Date/Time   CHOL 143 07/27/2018 0921   TRIG 86 07/27/2018 0921   HDL 58 07/27/2018 0921   CHOLHDL 2.5 07/27/2018 0921   CHOLHDL 5.9 (H) 11/01/2014 1026   VLDL 23 11/01/2014 1026   LDLCALC 68 07/27/2018 0921    BNP (last 3 results) No results for input(s): BNP in the last 8760 hours.  HEMOGLOBIN A1C Lab Results  Component Value Date   HGBA1C 6.2 (A) 05/01/2018   MPG 128 (H) 04/19/2011    Cardiac Panel (last 3 results) No results for input(s): CKTOTAL, CKMB, TROPONINI, RELINDX in the last 8760 hours.  Lab Results  Component Value Date   CKTOTAL 109 04/20/2011   CKMB 1.3 04/20/2011   TROPONINI <0.30 04/20/2011      Medications Prior to Admission  Medication Sig Dispense Refill  . Ascorbic Acid (VITAMIN C) 1000 MG tablet Take 1,000 mg by mouth daily.    [name] aspirin 81 MG tablet Take 81 mg by mouth daily.     [name] atorvastatin (LIPITOR) 20 MG tablet Take 1 tablet (20 mg total) by mouth daily. 90 tablet 0  . carboxymethylcellulose (REFRESH TEARS) 0.5 % SOLN Place 1 drop into both eyes 3 (three) times daily as needed (irritation).    [name] lisinopril-hydrochlorothiazide (ZESTORETIC) 20-25 MG tablet Take 1 tablet by mouth daily.    . metoprolol succinate (TOPROL-XL) 25 MG 24 hr tablet Take 1 tablet (25 mg total) by mouth daily. Take with or immediately following a meal. 30 tablet 2  . Misc Natural Products (PROSTATE SUPPORT PO) Take 1 tablet by mouth at bedtime.    . Multiple Vitamins-Minerals (LUTEIN-ZEAXANTHIN) TABS Take 1 tablet by mouth daily as needed (eye irritation).    . naproxen sodium (ALEVE) 220 MG tablet Take 440 mg by mouth daily as needed (headaches).    [name] omeprazole (PRILOSEC) 20 MG capsule Take 1 capsule (20 mg total) by mouth daily. 90 capsule 3  . Zinc 50 MG TABS Take 50 mg by mouth daily.    [name] amLODipine (NORVASC) 5 MG tablet Take 1 tablet (5 mg total) by mouth daily. (Patient not taking: Reported on 07/30/2018) 30 tablet 1  .  hydrochlorothiazide (HYDRODIURIL) 25 MG tablet Take 1 tablet (25 mg total) by mouth daily. (Patient not taking: Reported on 07/30/2018) 90 tablet 0  . nitroGLYCERIN (NITROSTAT) 0.4 MG SL tablet Place 0.4 mg under the tongue every 5 (five) minutes as needed for chest pain.        Current Facility-Administered Medications:  .  0.9 %  sodium chloride infusion, 250 mL, Intravenous, PRN, Calyb Mcquarrie J, MD .  [EXPIRED] 0.9% sodium chloride infusion, 3 mL/kg/hr, Intravenous, Continuous, Last Rate: 232.8 mL/hr at 08/04/18 1029, 3 mL/kg/hr at 08/04/18 1029 **FOLLOWED BY** 0.9% sodium chloride infusion, 1 mL/kg/hr, Intravenous, Continuous, Curt Oatis J, MD .  sodium chloride flush (NS) 0.9 % injection 3 mL, 3 mL, Intravenous, Q12H, Jaremy Nosal J, MD .  sodium chloride flush (NS) 0.9 % injection 3 mL, 3 mL, Intravenous, PRN, Sherman Lipuma, Reynold Bowen, MD  Today's Vitals   08/04/18 0931 08/04/18 1045 08/04/18 1051 08/04/18 1052  Pulse: 78     Temp: 97.9 F (36.6 C)     TempSrc: Oral     SpO2: 100%   100%  Weight: 77.6 kg     Height: 5\' 11"  (1.803 m)     PainSc:  5  5     Body mass index is 23.85 kg/m.    CARDIAC STUDIES:  EKG 08/04/2018: Sinus rhythm.  Nonspecific ST-T changes  CTA 01/28/2018: EXAM: CT FFR ANALYSIS  CLINICAL DATA:  68 year old male with chest pain.  FINDINGS: FFRct analysis was performed on the original cardiac CT angiogram dataset. Diagrammatic representation of the FFRct analysis is provided in a separate PDF document in PACS. This dictation was created using the PDF document and an interactive 3D model of the results. 3D model is not available in the EMR/PACS. Normal FFR range is >0.80.  1. Left Main:  No significant stenosis.  2. LAD: Proximal: 0.95, mid: 0.75. 3. LCX: No significant stenosis. 4. RCA: Proximal: 0.98, mid: 0.85, distal: 0.81.  IMPRESSION: 1. CT FFR analysis showed severe stenosis in the proximal LAD. A cardiac  catheterization is recommended.   Assessment/Plan  68 y.o. male  with hypertension, hyperlipidemia, abnormal nuclear stress test in Oct 2019, and underwent coronary CTA on 01/28/2018 revealing LAD proximal noncalcific greater than 70% stenosis, FFR 0.75, mid RCA moderate 50-69% stenosis, FFR 0.85.   Plan: Cardiac catheterization, coronary angiography, LAD intervention  Nigel Mormon, MD 08/04/2018, 10:54 AM [facility] Pager: [phone] Office: [phone] If no answer: [phone]

## 2018-08-04 NOTE — Discharge Instructions (Signed)
Drink plenty of fluids over next 48 hours and keep right wrist elevated at heart level for 24 hours  Radial Site Care  This sheet gives you information about how to care for yourself after your procedure. Your health care provider may also give you more specific instructions. If you have problems or questions, contact your health care provider. What can I expect after the procedure? After the procedure, it is common to have:  Bruising and tenderness at the catheter insertion area. Follow these instructions at home: Medicines  Take over-the-counter and prescription medicines only as told by your health care provider. Insertion site care  Follow instructions from your health care provider about how to take care of your insertion site. Make sure you: ? Wash your hands with soap and water before you change your bandage (dressing). If soap and water are not available, use hand sanitizer. ? Remove your dressing as told by your health care provider. In 24-48 hours  Check your insertion site every day for signs of infection. Check for: ? Redness, swelling, or pain. ? Fluid or blood. ? Pus or a bad smell. ? Warmth.  Do not take baths, swim, or use a hot tub until your health care provider approves.  You may shower 24-48 hours after the procedure, or as directed by your health care provider. ? Remove the dressing and gently wash the site with plain soap and water. ? Pat the area dry with a clean towel. ? Do not rub the site. That could cause bleeding.  Do not apply powder or lotion to the site. Activity   For 24 hours after the procedure, or as directed by your health care provider: ? Do not flex or bend the affected arm. ? Do not push or pull heavy objects with the affected arm. ? Do not drive yourself home from the hospital or clinic. You may drive 24 hours after the procedure unless your health care provider tells you not to. ? Do not operate machinery or power tools.  Do not lift  anything that is heavier than 5 lb, or the limit that you are told, until your health care provider says that it is safe. In 5 days  Ask your health care provider when it is okay to: ? Return to work or school. ? Resume usual physical activities or sports. ? Resume sexual activity. General instructions  If the catheter site starts to bleed, raise your arm and put firm pressure on the site. If the bleeding does not stop, get help right away. This is a medical emergency.  If you went home on the same day as your procedure, a responsible adult should be with you for the first 24 hours after you arrive home.  Keep all follow-up visits as told by your health care provider. This is important. Contact a health care provider if:  You have a fever.  You have redness, swelling, or yellow drainage around your insertion site. Get help right away if:  You have unusual pain at the radial site.  The catheter insertion area swells very fast.  The insertion area is bleeding, and the bleeding does not stop when you hold steady pressure on the area.  Your arm or hand becomes pale, cool, tingly, or numb. These symptoms may represent a serious problem that is an emergency. Do not wait to see if the symptoms will go away. Get medical help right away. Call your local emergency services (911 in the [place] Do not drive yourself  to the hospital. Summary  After the procedure, it is common to have bruising and tenderness at the site.  Follow instructions from your health care provider about how to take care of your radial site wound. Check the wound every day for signs of infection.  Do not lift anything that is heavier than 10 lb (4.5 kg), or the limit that you are told, until your health care provider says that it is safe. This information is not intended to replace advice given to you by your health care provider. Make sure you discuss any questions you have with your health care provider. Document  Released: 03/16/2010 Document Revised: 03/19/2017 Document Reviewed: 03/19/2017 Elsevier Interactive Patient Education  2019 [employer].

## 2018-08-05 ENCOUNTER — Encounter (HOSPITAL_COMMUNITY): Payer: Self-pay | Admitting: Cardiology

## 2018-08-12 ENCOUNTER — Ambulatory Visit (INDEPENDENT_AMBULATORY_CARE_PROVIDER_SITE_OTHER): Payer: Medicare Other | Admitting: Cardiology

## 2018-08-12 ENCOUNTER — Encounter: Payer: Self-pay | Admitting: Cardiology

## 2018-08-12 ENCOUNTER — Other Ambulatory Visit: Payer: Self-pay

## 2018-08-12 VITALS — BP 149/89 | HR 85 | Ht 71.0 in | Wt 181.4 lb

## 2018-08-12 DIAGNOSIS — Z955 Presence of coronary angioplasty implant and graft: Secondary | ICD-10-CM | POA: Diagnosis not present

## 2018-08-12 DIAGNOSIS — I25118 Atherosclerotic heart disease of native coronary artery with other forms of angina pectoris: Secondary | ICD-10-CM

## 2018-08-12 DIAGNOSIS — I10 Essential (primary) hypertension: Secondary | ICD-10-CM

## 2018-08-12 DIAGNOSIS — E782 Mixed hyperlipidemia: Secondary | ICD-10-CM

## 2018-08-12 NOTE — Progress Notes (Signed)
Primary Physician:  [name], MD   Patient ID: Adam Burton, male    DOB: June 09, 1950, 67 y.o.   MRN: [id number]  Subjective:    Chief Complaint  Patient presents with  . Hypertension  . Chest Pain  . Coronary Artery Disease  . Follow-up    7-10 CATH    HPI: Adam Burton  is a 68 y.o. male  with hypertension, hyperlipidemia, abnormal nuclear stress test in Oct 2019, and abnormal coronary CTA.Due class 2 symptoms of angina despite medical management, underwent cath on 08/04/2018 with PTCA to proximal LAD. He now presents for follow up.  Patient reports that he has not had any further exertional chest pain since his PCI. He does report some chest tightness after eating that resolves after 15 minutes or so. Denies any shortness of breath. No bleeding, drainage, or swelling from right radial access site. Patient is unsure of many of his medications today. He does know that he is taking Plavix and ASA. Patient was found to be on both HCTZ and lisinopril HCTZ when he presented for his procedure; therefore, he was advised to stop HCTZ. He was unsure of which medication he should stop after he got home; therefore, has only been taking the plavix and ASA.   Patient reports that he has not used any marijuana or alcohol since his procedure. No cigarette use.Patient is orginially from [place] and moved to the [place] in 1960's.  Past Medical History:  Diagnosis Date  . Hypercholesteremia   . Hypertension   . Renal insufficiency 08/21/2012    Past Surgical History:  Procedure Laterality Date  . CARDIAC CATHETERIZATION  01/2009   Dr. [place]. Normal coronary arteries. Normal LV function.  . CORONARY ANGIOGRAPHY N/A 08/04/2018   Procedure: CORONARY ANGIOGRAPHY (CATH LAB);  Surgeon: Nigel Mormon, MD;  Location: [facility];  Service: Cardiovascular;  Laterality: N/A;  . CORONARY STENT INTERVENTION N/A 08/04/2018   Procedure: CORONARY STENT INTERVENTION;  Surgeon:  Nigel Mormon, MD;  Location: [facility];  Service: Cardiovascular;  Laterality: N/A;  . HERNIA REPAIR    . NM MYOVIEW LTD  12/2008   Potential small area of mild ischemia in the basal inferoseptal wall. Otherwise normal. --> False positive test based on coronary angiography  . Stress Echocardiogram  12/2013   Exercise for just over 4 minutes. Heart rate increased to 148 BPM. Hypertensive response to exercise. EF 60%. No evidence of ischemia. NORMAL STUDY  . TRANSTHORACIC ECHOCARDIOGRAM  11/2013   EF 55-60%. No regional wall motion abnormality is. Normal thickness. Mild LA dilation.    Social History   Socioeconomic History  . Marital status: Widowed    Spouse name: Not on file  . Number of children: 3  . Years of education: Not on file  . Highest education level: Not on file  Occupational History  . Not on file  Social Needs  . Financial resource strain: Not on file  . Food insecurity    Worry: Not on file    Inability: Not on file  . Transportation needs    Medical: Not on file    Non-medical: Not on file  Tobacco Use  . Smoking status: Never Smoker  . Smokeless tobacco: Never Used  Substance and Sexual Activity  . Alcohol use: Yes    Comment: beer or vodka occasionally  . Drug use: Yes    Types: Marijuana    Comment: 3-6 X per week  . Sexual activity:  Yes  Lifestyle  . Physical activity    Days per week: Not on file    Minutes per session: Not on file  . Stress: Not on file  Relationships  . Social Herbalist on phone: Not on file    Gets together: Not on file    Attends religious service: Not on file    Active member of club or organization: Not on file    Attends meetings of clubs or organizations: Not on file    Relationship status: Not on file  . Intimate partner violence    Fear of current or ex partner: Not on file    Emotionally abused: Not on file    Physically abused: Not on file    Forced sexual activity: Not on file  Other  Topics Concern  . Not on file  Social History Narrative   He is a widowed father of 93, grandfather of 3. He lives with his current girlfriend. 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    Review of Systems  Constitution: Negative for decreased appetite, malaise/fatigue, weight gain and weight loss.  Eyes: Negative for visual disturbance.  Cardiovascular: Positive for chest pain. Negative for claudication, dyspnea on exertion, leg swelling, orthopnea, palpitations and syncope.  Respiratory: Negative for hemoptysis and wheezing.   Endocrine: Negative for cold intolerance and heat intolerance.  Hematologic/Lymphatic: Does not bruise/bleed easily.  Skin: Negative for nail changes.  Musculoskeletal: Negative for muscle weakness and myalgias.  Gastrointestinal: Negative for abdominal pain, change in bowel habit, nausea and vomiting.  Neurological: Negative for difficulty with concentration, dizziness, focal weakness and headaches.  Psychiatric/Behavioral: Negative for altered mental status and suicidal ideas.  All other systems reviewed and are negative.     Objective:  Blood pressure (!) 149/89, pulse 85, height 5\' 11"  (1.803 m), weight 181 lb 6.4 oz (82.3 kg), SpO2 97 %. Body mass index is 25.3 kg/m.    Physical Exam  Constitutional: He is oriented to person, place, and time. Vital signs are normal. He appears well-developed and well-nourished.  HENT:  Head: Normocephalic and atraumatic.  Neck: Normal range of motion.  Cardiovascular: Normal rate, regular rhythm, normal heart sounds and intact distal pulses.  Pulmonary/Chest: Effort normal and breath sounds normal. No accessory muscle usage. No respiratory distress.  Abdominal: Soft. Bowel sounds are normal.  Musculoskeletal: Normal range  of motion.  Neurological: He is alert and oriented to person, place, and time.  Skin: Skin is warm and dry.  Vitals reviewed.  Radiology: No results found.  Laboratory examination:    CMP Latest Ref Rng & Units 07/27/2018 05/01/2018 10/02/2017  Glucose 65 - 99 mg/dL 110(H) 110(H) 102(H)  BUN 8 - 27 mg/dL 11 11 10   Creatinine 0.76 - 1.27 mg/dL 1.36(H) 1.20 1.33(H)  Sodium 134 - 144 mmol/L 139 140 140  Potassium 3.5 - 5.2 mmol/L 3.9 4.5 4.9  Chloride 96 - 106 mmol/L 99 102 101  CO2 20 - 29 mmol/L 22 22 27   Calcium 8.6 - 10.2 mg/dL 9.7 9.3 9.9  Total Protein 6.0 - 8.5 g/dL - 7.1 -  Total Bilirubin 0.0 - 1.2 mg/dL - 0.5 -  Alkaline Phos 39 - 117 IU/L - 63 -  AST 0 - 40 IU/L - 17 -  ALT 0 - 44 IU/L - 15 -   CBC Latest Ref Rng & Units 07/27/2018 11/01/2014 12/06/2013  WBC 3.4 - 10.8 x10E3/uL 6.7 4.9 7.4  Hemoglobin 13.0 - 17.7 g/dL 14.8 15.2 16.5  Hematocrit 37.5 - 51.0 % 42.4 44.2 48.5  Platelets 150 - 450 x10E3/uL 308 356 327   Lipid Panel     Component Value Date/Time   CHOL 143 07/27/2018 0921   TRIG 86 07/27/2018 0921   HDL 58 07/27/2018 0921   CHOLHDL 2.5 07/27/2018 0921   CHOLHDL 5.9 (H) 11/01/2014 1026   VLDL 23 11/01/2014 1026   LDLCALC 68 07/27/2018 0921   LDLDIRECT 84 09/03/2011 0930   HEMOGLOBIN A1C Lab Results  Component Value Date   HGBA1C 6.2 (A) 05/01/2018   MPG 128 (H) 04/19/2011   TSH No results for input(s): TSH in the last 8760 hours.  PRN Meds:. There are no discontinued medications. Current Meds  Medication Sig  . aspirin 81 MG tablet Take 81 mg by mouth daily.   . clopidogrel (PLAVIX) 75 MG tablet Take 1 tablet (75 mg total) by mouth daily with breakfast.    Cardiac Studies:   Cath 08/04/2018:  LM: Normal LAD: Prox-mid 75% stenosis. CTA FFR 0.75.         Diag 1 small vessel with 70% disease LCx: Normal RCA: Prox 60% stenosis. CTA FFR 0.85  Successful percutaneous coronary intervention prox-mid LAD PTCA and stent placement Resolute Onyx  3.5 X 22 mm drug-eluting stent Recommend medical management for small vessel diag disease, and RCA nonobstructive disease.  Coronary CTA 01/28/2018: Left main normal, normal ramus and circumflex. LAD proximal noncalcific greater than 70% stenosis, FFR 0.75, mid RCA moderate 50-69% stenosis, FFR 0.85. Coronary calcium score: 21. Recommend cardiac catheterization.  Exercise myoview stress 12/08/2017: 1. Resting EKG demonstrates normal sinus rhythm, LVH with nonspecific inferolateral T-wave abnormality. Stress EKG is equivocal for ischemia with 2 mm upsloping ST segment depression noted in inferior and lateral leads that normalized at 2 minutes into recovery. Stress symptoms included chest tightness. resting blood pressure 140/96 and peak blood pressure 134/94 mmHg. Patient exercised for 6:54 minutes and achieved 8.43 Mets. Stress terminated due to fatigue and 88% of MPHR achieved. 2. The left ventricle was mildly dilated, both in rest and stress images at 124 mL. Stress and rest SPECT images demonstrate homogeneous tracer distribution throughout the myocardium. Gated SPECT imaging reveals global hypokinesis with EF calculated at 36%. 3. This is a high risk study, in view of abnormal EKG response, chest pain and reduced EF.   Echocardiogram 12/29/2017: Left ventricle cavity is normal in size. Mild concentric hypertrophy of the left ventricle. Low normal decrease in global wall motion. Normal diastolic filling pattern. Left ventricle regional wall motion findings: No wall motion abnormalities. Visual EF is 50-55%. Calculated EF 56%. Trileaflet aortic valve with no regurgitation noted. Mild aortic valve leaflet calcification. Structurally normal mitral valve with trace regurgitation. Structurally normal tricuspid valve with trace regurgitation.  Assessment:   1. Coronary artery disease of native artery of native heart with stable angina pectoris ([place])   2. S/P primary angioplasty with coronary stent    3. Essential hypertension   4. Mixed hyperlipidemia     EKG 08/12/2018: Normal sinus rhythm at 80 bpm, left axis deviation, diffuse nonspecific T wave flattening/inversion. No significant change compared to previous EKG.   Recommendations:   Patient is presently doing well without any significant complaints post-PCI. He is now able to walk without chest pain.  He actually walked for 45 mins to our office today. Does have 60% mid-RCA that will continue to need aggressive medical management. He does mention occasional chest tightness after eating. He is found to not be on any of his medications except for ASA and plavix as he was confused about which medication to stop, I have printed an updated medication list and advised him on the importance of compliance daily. He is aware of need for ASA and plavix for at least 6 months and ASA indefinietly. Blood pressure also elevated today, but will improve with resuming home BP meds. Lipids are stable with lipitor. He is interested in starting cardiac rehab, will arrange for this. Right radial access healing well without complications. I will see him back in 3 months or sooner if needed.   Miquel Dunn, MSN, APRN, FNP-C [facility]. [place] Office: [phone] Fax: [phone]

## 2018-08-12 NOTE — Patient Instructions (Signed)
Please take all your medications daily as prescribed  Will need Aspirin and Plavix (Clopidogrel) both for at least 6 months. Aspirin indefinitely

## 2018-08-17 ENCOUNTER — Encounter (HOSPITAL_COMMUNITY): Payer: Self-pay

## 2018-08-17 ENCOUNTER — Telehealth (HOSPITAL_COMMUNITY): Payer: Self-pay | Admitting: *Deleted

## 2018-08-17 ENCOUNTER — Telehealth (HOSPITAL_COMMUNITY): Payer: Self-pay

## 2018-08-17 NOTE — Telephone Encounter (Signed)
Dr.  Virgina Jock  As you are aware our department remains closed to patients due to Covid-19.  We are excited to be able to offer an alternative to traditional onsite Cardiac Rehab while your patient continues to follow Re-Open guidelines. This is a notification that your patient has been contacted and is very interested in participating in Virtual Cardiac Rehab.  Thank you for your continued support in helping [place] meet the health care needs of our patients.  Jessica C.  Cardiac Rehab staff

## 2018-08-17 NOTE — Telephone Encounter (Signed)
PC to patient to set up app.  No answer. LMTCB.

## 2018-08-17 NOTE — Telephone Encounter (Signed)

## 2018-08-25 ENCOUNTER — Encounter (HOSPITAL_COMMUNITY): Payer: Self-pay

## 2018-08-25 NOTE — Progress Notes (Signed)
Received referral for participation in cardiac rehab program. Unable to contact pt. Letter mailed requesting patient to return call regarding this by 09/08/2018. If no response, will discharge from cardiac rehab program.   Carma Lair MS, ACSM CEP 3:48 PM 08/25/2018

## 2018-10-02 ENCOUNTER — Telehealth: Payer: Self-pay | Admitting: Cardiology

## 2018-10-02 NOTE — Telephone Encounter (Signed)
Patient called with c/o of pain in his right shoulder and elbow with laying down at night also unable to reach his arm behind his head. Questions if related to cath. I suspect musculoskeletal etiology. Advised follow up with PCP.

## 2018-10-07 ENCOUNTER — Other Ambulatory Visit: Payer: Self-pay

## 2018-10-07 ENCOUNTER — Encounter: Payer: Self-pay | Admitting: Family Medicine

## 2018-10-07 ENCOUNTER — Ambulatory Visit (INDEPENDENT_AMBULATORY_CARE_PROVIDER_SITE_OTHER): Payer: Medicare Other | Admitting: Family Medicine

## 2018-10-07 VITALS — BP 122/70 | HR 64

## 2018-10-07 DIAGNOSIS — M12811 Other specific arthropathies, not elsewhere classified, right shoulder: Secondary | ICD-10-CM

## 2018-10-07 MED ORDER — ACETAMINOPHEN 500 MG PO TABS: 1000.0000 mg | ORAL_TABLET | Freq: Three times a day (TID) | ORAL | 0 refills | Status: AC | PRN

## 2018-10-07 NOTE — Patient Instructions (Addendum)
It was nice to meet you today,  It looks like you have injured your subscapularis tendon, which is part of your rotator cuff.  I am unsure of how this injury occurred, it could just have been a result of long-term wear and tear.  The treatment is pain relief with medications such as Tylenol and other pain relievers, but I would not heavy use the over-the-counter NSAIDs such as ibuprofen and Aleve as you already taking aspirin and Plavix and this can result in worsening bleeding.  You can use topical treatment such as capsaicin cream or other topical pain relievers like Voltaren gel.  Use ice on the joint when resting.  I have given you a referral to physical therapy so they can work with you on exercises to do to strengthen this tendon.  You should continue to do these exercises after you are done seeing a physical therapist.  I have also included a sheet of paper with some rotator cuff rehab exercises that you can try at home.  You just need to get a rubber sports band.  You can pick these up at a pharmacy or sporting good store.  Have a great day,   Clemetine Marker, MD   Subscapularis Tendon Injury  A subscapularis tendon injury is a tear in the strong cord of tissue (tendon) that connects one of the shoulder muscles (subscapularis muscle) to the top of the upper arm bone (humerus). The subscapularis muscle starts at the inside of the shoulder blade (scapula) and attaches to the humerus. The subscapularis muscle is one of four muscles that make up the rotator cuff in the shoulder. It helps to rotate the shoulder inward and stabilize the shoulder. This injury is sometimes called a rotator cuff tear. Subscapularis tears can be partial or complete tears. These injuries cause shoulder pain and weakness, and, in severe cases, shoulder instability. Subscapularis tears may also involve tearing of a different muscle in the rotator cuff, injury to the biceps tendon, or both. What are the causes? Subscapularis tears  are commonly caused by gradual wear and tear from overuse. Gradual wear and tear can result from:  Participating in sports or activities that involve overhead arm movements.  Aging. Subscapularis tears can also be caused by a sudden (acute) injury, which can result from:  Falling on an outstretched arm.  Forcefully pulling or thrusting your arm upward, especially with unexpected resistance.  The shoulder joint moving out of place (dislocation). What increases the risk? You are more likely to develop this condition if you:  Are male, especially if you are age 56 or older.  Play baseball, especially pitching.  Play tennis.  Participate in rowing activities.  Lift weights.  Are a Curator or a Games developer. What are the signs or symptoms? The main symptom of this condition is shoulder pain. If your condition is caused by an acute injury, pain may be sudden and severe. If your condition is caused by overuse, pain may get worse with activity and get better with rest. Other signs and symptoms may include:  Pain with shoulder movement.  Pain at night when lying on your arm or when your arm is unsupported.  Stiffness and limited range of motion.  Weakness. How is this diagnosed? This condition is diagnosed based on:  Your symptoms.  Your medical history.  A physical exam. Your health care provider may check your shoulder strength and range of motion. This may include having you put your hand behind your back and try to  move your hand away from your back (lift-off test).  Imaging tests, such as an MRI or a CT scan. How is this treated? Treatment for this condition may include:  Resting and avoiding activities that cause shoulder pain.  Taking NSAIDs, such as ibuprofen, to help reduce pain and swelling.  Doing physical therapy to improve your strength and range of motion.  Having one or more shots (injections) of medicines to numb the area and reduce inflammation  (steroids).  Having surgery to repair the tendon. This may be done if: ? Nonsurgical treatments have not helped. ? You have had a recent acute injury. ? You have a large tear. ? You have a lot of shoulder weakness. ? You are an active person or athlete who needs complete shoulder function. After surgery, treatment includes keeping your arm still while it heals (immobilization) and physical therapy. Follow these instructions at home: Medicines  Take over-the-counter and prescription medicines only as told by your health care provider.  Ask your health care provider if the medicine prescribed to you: ? Requires you to avoid driving or using heavy machinery. ? Can cause constipation. You may need to take these actions to prevent or treat constipation:  Drink enough fluid to keep your urine pale yellow.  Take over-the-counter or prescription medicines.  Eat foods that are high in fiber, such as beans, whole grains, and fresh fruits and vegetables.  Limit foods that are high in fat and processed sugars, such as fried or sweet foods. Managing pain, stiffness, and swelling   If directed, put ice on the injured area. ? Put ice in a plastic bag. ? Place a towel between your skin and the bag. ? Leave the ice on for 20 minutes, 2-3 times a day. Activity  Do not lift anything that is heavier than 10 lb (4.5 kg), or the limit that you are told, until your health care provider says that it is safe.  Return to your normal activities as told by your health care provider. Ask your health care provider what activities are safe for you.  Ask your health care provider when it is safe for you to drive.  Do exercises as told by your health care provider. General instructions  Do not use any products that contain nicotine or tobacco, such as cigarettes, e-cigarettes, and chewing tobacco. These can delay healing. If you need help quitting, ask your health care provider.  Keep all follow-up  visits as told by your health care provider. This is important. How is this prevented?  Warm up and stretch before being active.  Cool down and stretch after being active.  Give your body time to rest between periods of activity.  Maintain physical fitness, including strength and flexibility.  Be safe and responsible while being active. This will help you to avoid falls. Contact a health care provider if:  You continue to have pain after 6 weeks of treatment.  You have pain or other symptoms that get worse. Summary  A subscapularis tendon injury is a tear in the strong cord of tissue (tendon) that connects one of the shoulder muscles (subscapularis muscle)to the top of the upper arm bone (humerus).  These injuries cause shoulder pain and weakness, and, in severe cases, shoulder instability.  Subscapularis tears are commonly caused by gradual wear and tear from overuse.  The condition may be treated with rest, medicines, physical therapy, and surgery if needed. This information is not intended to replace advice given to you by your  health care provider. Make sure you discuss any questions you have with your health care provider. Document Released: 02/11/2005 Document Revised: 06/04/2018 Document Reviewed: 04/15/2018 Elsevier Patient Education  2020 [employer].

## 2018-10-07 NOTE — Progress Notes (Signed)
   [name] - 68 y.o. male MRN [id number]  Date of birth: 20-Nov-1950  Subjective:   cc: Right shoulder pain  HPI:  R shoulder pain: Patient had a right heart cath 2 months ago and wonders if this is what is causing his pain.  He says the pain started about 3 weeks ago and has been getting worse.  He states once he starts moving around it gets better.  He does not take Tylenol, but took Aleve 2 days ago.  He also uses Voltaren gel at night, which he states allows him to get to sleep by making the pain tolerable.  He has no previous history of shoulder injury/arm injury or pain.  ROS: See HPI for pertinent positives and negatives  Past Medical History  Family history reviewed for today's visit. No changes.  Health Maintenance:  -  Health Maintenance Due  Topic Date Due  . COLONOSCOPY  01/17/1999  . TETANUS/TDAP  03/29/2015  . INFLUENZA VACCINE  09/26/2018    Objective:   BP 122/70   Pulse 64   SpO2 97%  Gen: NAD, alert and oriented, cooperative with exam CV: normal rate, regular rhythm. No murmurs, no rubs.  Resp: LCTAB, no wheezes, crackles. normal work of breathing Msk: limited range of motion of the right arm with flexion and extension d/t pain. Also cannot move right arm behind the coronal plane when arms are abducted at 90 degrees to the floor.  Positive push off test on right side.   Psych: Appropriate behavior  Assessment/Plan:   Rotator cuff arthropathy of right shoulder Appears to have a subscapularis tendinitis.  Nontender to palpation.  No inciting incident/injury to account for pain.  Do not suspect tear. - Continue to use diclofenac gel - Prescribed Tylenol 1000 mg every 8 hours. -Advised patient to ice the area when resting as well as limit his overuse of the shoulder - Referral to physical therapy - Follow-up in 1 month if pain is improved.   Orders Placed This Encounter  Procedures  .  Ambulatory referral to Physical Therapy    Referral Priority:   Routine    Referral Type:   Physical Medicine    Referral Reason:   Specialty Services Required    Requested Specialty:   Physical Therapy    Number of Visits Requested:   1    Meds ordered this encounter  Medications  . acetaminophen (TYLENOL) 500 MG tablet    Sig: Take 2 tablets (1,000 mg total) by mouth every 8 (eight) hours as needed for moderate pain.    Dispense:  30 tablet    Refill:  0     Clemetine Marker, MD PGY-2 Center [facility] Residency

## 2018-10-11 DIAGNOSIS — M7541 Impingement syndrome of right shoulder: Secondary | ICD-10-CM | POA: Insufficient documentation

## 2018-10-11 NOTE — Assessment & Plan Note (Signed)
Appears to have a subscapularis tendinitis.  Nontender to palpation.  No inciting incident/injury to account for pain.  Do not suspect tear. - Continue to use diclofenac gel - Prescribed Tylenol 1000 mg every 8 hours. -Advised patient to ice the area when resting as well as limit his overuse of the shoulder - Referral to physical therapy - Follow-up in 1 month if pain is improved.

## 2018-10-16 ENCOUNTER — Telehealth: Payer: Self-pay

## 2018-10-16 ENCOUNTER — Other Ambulatory Visit: Payer: Self-pay

## 2018-10-16 DIAGNOSIS — E7800 Pure hypercholesterolemia, unspecified: Secondary | ICD-10-CM

## 2018-10-16 MED ORDER — ROSUVASTATIN CALCIUM 20 MG PO TABS: 20.0000 mg | ORAL_TABLET | Freq: Every day | ORAL | 3 refills | Status: DC

## 2018-10-16 NOTE — Telephone Encounter (Signed)
Pt called and wants to know if you can switch his cholesterol meds due to muscle pain

## 2018-10-16 NOTE — Telephone Encounter (Signed)
Sure, please send in Crestor 20 mg daily

## 2018-10-20 ENCOUNTER — Telehealth: Payer: Self-pay | Admitting: Cardiology

## 2018-10-20 NOTE — Telephone Encounter (Signed)
Called patient due to many concerns regarding bilateral shoulder pain and shortness of breath while talking. He was very upset that he had not heard from me personally. I apologized for his frustration, but advised him that I may not be able to call him directly each time. If he would like to speak to me directly to please ask for me to call when able. 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 His pain is clearly positional. Advised him to continue to use Ibuprofen/Tylenol/Voltaren gel. I am unsure for reason of his shortness of breath while talking. He is able to walk to the store without difficulty. I have recommended he hold his crestor until he sees me on 09/17.

## 2018-10-22 ENCOUNTER — Encounter: Payer: Self-pay | Admitting: Physical Therapy

## 2018-10-22 ENCOUNTER — Ambulatory Visit: Payer: Medicare Other | Attending: Family Medicine | Admitting: Physical Therapy

## 2018-10-22 ENCOUNTER — Other Ambulatory Visit: Payer: Self-pay

## 2018-10-22 DIAGNOSIS — M25611 Stiffness of right shoulder, not elsewhere classified: Secondary | ICD-10-CM | POA: Diagnosis not present

## 2018-10-22 DIAGNOSIS — M25511 Pain in right shoulder: Secondary | ICD-10-CM | POA: Diagnosis not present

## 2018-10-22 NOTE — Therapy (Signed)
[place], [place], [address] Phone: [phone]   Fax:  [phone]  Physical Therapy Evaluation  Patient Details  Name: Adam Burton MRN: [id number] Date of Birth: [birth year] Referring Provider (PT): Martyn Malay, MD   Encounter Date: 10/22/2018  PT End of Session - 10/22/18 1100    Visit Number  1    Number of Visits  15    Date for PT Re-Evaluation  12/03/18    Authorization Type  [employer] MCR, progress note at 80, KX at 15    PT Start Time  0930    PT Stop Time  1015    PT Time Calculation (min)  45 min    Activity Tolerance  Patient tolerated treatment well    Behavior During Therapy  [facility] for tasks assessed/performed       Past Medical History:  Diagnosis Date  . Hypercholesteremia   . Hypertension   . Renal insufficiency 08/21/2012    Past Surgical History:  Procedure Laterality Date  . CARDIAC CATHETERIZATION  01/2009   Dr. Martinique. Normal coronary arteries. Normal LV function.  . CORONARY ANGIOGRAPHY N/A 08/04/2018   Procedure: CORONARY ANGIOGRAPHY (CATH LAB);  Surgeon: Nigel Mormon, MD;  Location: [place] [facility];  Service: Cardiovascular;  Laterality: N/A;  . CORONARY STENT INTERVENTION N/A 08/04/2018   Procedure: CORONARY STENT INTERVENTION;  Surgeon: Nigel Mormon, MD;  Location: [place] [facility];  Service: Cardiovascular;  Laterality: N/A;  . HERNIA REPAIR    . NM MYOVIEW LTD  12/2008   Potential small area of mild ischemia in the basal inferoseptal wall. Otherwise normal. --> False positive test based on coronary angiography  . Stress Echocardiogram  12/2013   Exercise for just over 4 minutes. Heart rate increased to 148 BPM. Hypertensive response to exercise. EF 60%. No evidence of ischemia. NORMAL STUDY  . TRANSTHORACIC ECHOCARDIOGRAM  11/2013   EF 55-60%. No regional wall motion abnormality is. Normal thickness. Mild LA dilation.    There were no vitals filed  for this visit.   Subjective Assessment - 10/22/18 0924    Subjective  R shoulder pain: Patient had a right heart cath 2 months ago and wonders if this is what is causing his pain.  He says the pain started about 3 weeks ago and has been getting worse.  He states once he starts moving around it gets better or if he drops it down by his side. He says reaching up, across or behind his back makes the pain worse and sleeping is hard.    Pertinent History  PMH: HTN, caridac cath and angiographywith stent interviention 08/04/18    Limitations  Lifting;Sitting;House hold activities    Diagnostic tests  no imaging done yet    Patient Stated Goals  ease the pain    Currently in Pain?  Yes    Pain Score  9     Pain Location  Shoulder    Pain Orientation  Right    Pain Descriptors / Indicators  Aching    Pain Type  Acute pain    Pain Radiating Towards  no N/T reported in his arms    Pain Onset  1 to 4 weeks ago    Pain Frequency  Constant    Aggravating Factors   see above    Pain Relieving Factors  meds, voltaren gel    Effect of Pain on Daily Activities  limits most ADL's  [facility] PT Assessment - 10/22/18 0001      Assessment   Medical Diagnosis  Rt shoulder pain, frozen shoulder    Referring Provider (PT)  Martyn Malay, MD    Onset Date/Surgical Date  --   3 week onset of pain   Hand Dominance  Right    Next MD Visit  11/12/18    Prior Therapy  none      Precautions   Precautions  None      Restrictions   Weight Bearing Restrictions  No      Balance Screen   Has the patient fallen in the past 6 months  No      [place] residence      Prior Function   Level of V[place]  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   Overall Cognitive Status  Within Functional Limits for tasks assessed      Observation/Other Assessments   Focus on Therapeutic Outcomes (FOTO)   do 2nd visit due  to time contraints      Sensation   Light Touch  Appears Intact      Coordination   Gross Motor Movements are Fluid and Coordinated  Yes      ROM / Strength   AROM / PROM / Strength  AROM;PROM;Strength      AROM   AROM Assessment Site  Shoulder    Right/Left Shoulder  Right    Right Shoulder Flexion  120 Degrees    Right Shoulder ABduction  80 Degrees    Right Shoulder Internal Rotation  --   behind back to Rt pocket   Right Shoulder External Rotation  --   behind head to C7   Left Shoulder Flexion  --    Left Shoulder ABduction  --      PROM   Overall PROM Comments  PROM grossly the same as AROM suggesting frozen shoulder      Strength   Overall Strength Comments  grip strenght 4/5 on Rt, 5/5 Lt    Strength Assessment Site  Shoulder    Right/Left Shoulder  Right    Right Shoulder Flexion  5/5    Right Shoulder ABduction  4/5    Right Shoulder Internal Rotation  4+/5    Right Shoulder External Rotation  4/5      Palpation   Palpation comment  no TTP reported      Special Tests   Other special tests  + impingement signs, decreased GH mobility and hard end feel with PROM suggesting frozen shoulder      Transfers   Transfers  Independent with all Transfers      Ambulation/Gait   Gait Comments  WFL                Objective measurements completed on examination: See above findings.      [facility] Adult PT Treatment/Exercise - 10/22/18 0001      Modalities   Modalities  Moist Heat;Electrical Stimulation      Moist Heat Therapy   Number Minutes Moist Heat  10 Minutes    Moist Heat Location  Shoulder      Electrical Stimulation   Electrical Stimulation Location  Rt shouler    Electrical Stimulation Action  IFC    Electrical Stimulation Parameters  tolerance    Electrical Stimulation Goals  Pain  Manual Therapy   Manual therapy comments  PROM and [place] mobs             PT Education - 10/22/18 1058    Education Details  HEP, exam findings,  POC, TENS    Person(s) Educated  Patient    Methods  Explanation;Demonstration;Verbal cues;Handout    Comprehension  Verbalized understanding;Need further instruction          PT Long Term Goals - 10/22/18 1115      PT LONG TERM GOAL #1   Title  Pt will be I and compliant with HEP. (Target goal for all goals 6 weeks 12/03/18)    Status  New      PT LONG TERM GOAL #2   Title  Pt will improve Rt shoulder ROM to [facility] to improve mobility    Status  New      PT LONG TERM GOAL #3   Title  Pt will improve strength to at least 5/5 MMT to improve function    Status  New      PT LONG TERM GOAL #4   Title  Pt will improve overall activity tolerance and report pain less than 2-3/10 with ususal activity.    Status  New             Plan - 10/22/18 1101    Clinical Impression Statement  Pt presents with Rt shoulder pain consistent with frozen shoulder (freezing phase) as he has hard end feel with PROM, PROM is grossly close to AROM and he has decreased [place] mobility. He also has some RTC weakness. He has overall decreased ROM, decreased strength, decreased functional abilites for reaching, lifting, carrying, and increased pain. He will benefit from skilled PT to address his deficits.    Personal Factors and Comorbidities  Comorbidity 2;Comorbidity 1    Comorbidities  PMH: HTN, CAD caridac cath and angiographywith stent interviention 08/04/18    Examination-Activity Limitations  Carry;Lift;Reach Overhead    Examination-Participation Restrictions  Cleaning;Driving;Laundry;Yard Work;Meal Prep    Stability/Clinical Decision Making  Evolving/Moderate complexity    Clinical Decision Making  Moderate    Rehab Potential  Good    PT Frequency  Other (comment)   2-3   PT Duration  6 weeks    PT Treatment/Interventions  Electrical Stimulation;Cryotherapy;Iontophoresis 4mg /ml Dexamethasone;Moist Heat;Ultrasound;Therapeutic activities;Therapeutic exercise;Neuromuscular re-education;Manual  techniques;Joint Manipulations;Spinal Manipulations;Taping;Dry needling    PT Next Visit Plan  review HEP, consider modalties and MT for pain, needs ROM, PROM, GH mobs, RTC strength, grip strength    PT Home Exercise Plan  cane AAROM, self inf GH mobs sitting in chair, table slide abd    Consulted and Agree with Plan of Care  Patient       Patient will benefit from skilled therapeutic intervention in order to improve the following deficits and impairments:  Decreased activity tolerance, Decreased range of motion, Decreased strength, Hypomobility, Pain  Visit Diagnosis: Acute pain of right shoulder  Stiffness of right shoulder, not elsewhere classified     Problem List Patient Active Problem List   Diagnosis Date Noted  . Rotator cuff arthropathy of right shoulder 10/11/2018  . Coronary artery disease 07/17/2018  . Coronary artery disease of native artery of native heart with stable angina pectoris ([place]) 06/11/2018  . Alcohol use 04/24/2018  . Gastroesophageal reflux disease 10/02/2017  . Esophageal dysphagia 11/20/2016  . Corn of foot 09/19/2016  . Marijuana use 06/27/2016  . Prediabetes 10/10/2015  . Left shoulder pain 05/30/2015  . Discomfort  in chest 01/26/2015  . Cataract, left eye 11/03/2014  . Gastritis 02/11/2014  . Impaired fasting glucose 10/19/2013  . Substance abuse ([place]) 09/04/2011  . Erectile dysfunction 09/04/2011  . Angina pectoris ([place]) 04/19/2011  . Hemoptysis 03/29/2011  . Colon cancer screening 03/29/2011  . Prostate cancer screening 03/29/2011  . DOE (dyspnea on exertion) 11/28/2009  . Hyperlipidemia 04/04/2009  . Benign prostatic hyperplasia 04/04/2009  . Essential hypertension 01/23/2009  . NOCTURIA 01/05/2009  . Essential hypertension, benign 01/05/2009    Silvestre Mesi 10/22/2018, 11:19 AM  [facility] [address] [place], [place], [address] Phone: [phone]   Fax:   [phone]  Name: Adam Burton MRN: [id number] Date of Birth: [birth year]

## 2018-10-22 NOTE — Patient Instructions (Addendum)
Access Code: [email or web address]  URL: [email or web address]  Date: 10/22/2018  Prepared by: Elsie Ra   Exercises  Supine Shoulder Flexion Extension AAROM with [place] - 10 reps - 1 sets - 3x daily - 7x weekly  Supine Shoulder External Rotation in 45 Degrees Abduction AAROM with [place]el - 10 reps - 1 sets - 3x daily - 7x weekly  Shoulder Scaption AAROM with Dowel - 10 reps - 1 sets - 3x daily - 7x weekly  Standing Shoulder Internal Rotation Stretch with Towel - 10 reps - 1 sets - 3x daily - 7x weekly  Seated Shoulder Abduction Towel Slide at Table Top - 10 reps - 1-2 sets - 5 hold - 2x daily - 6x weekly  Seated Shoulder Inferior Glide - 10 reps - 3 sets - 2x daily - 6x weekly   TENS UNIT: This is helpful for muscle pain and spasm.   Search and Purchase a TENS 7000 2nd edition at www.tenspros.com. It should be less than $30.     TENS unit instructions: Do not shower or bathe with the unit on Turn the unit off before removing electrodes or batteries If the electrodes lose stickiness add a drop of water to the electrodes after they are disconnected from the unit and place on plastic sheet. If you continued to have difficulty, call the TENS unit company to purchase more electrodes. Do not apply lotion on the skin area prior to use. Make sure the skin is clean and dry as this will help prolong the life of the electrodes. After use, always check skin for unusual red areas, rash or other skin difficulties. If there are any skin problems, does not apply electrodes to the same area. Never remove the electrodes from the unit by pulling the wires. Do not use the TENS unit or electrodes other than as directed. Do not change electrode placement without consultating your therapist or physician. Keep 2 fingers with between each electrode. Wear time ratio is 2:1, on to off times.    For example on for 30 minutes off for 15 minutes and then on for 30 minutes off for 15 minutes

## 2018-10-27 ENCOUNTER — Other Ambulatory Visit: Payer: Self-pay

## 2018-10-27 ENCOUNTER — Ambulatory Visit: Payer: Medicare Other | Attending: Family Medicine | Admitting: Physical Therapy

## 2018-10-27 DIAGNOSIS — M25611 Stiffness of right shoulder, not elsewhere classified: Secondary | ICD-10-CM | POA: Insufficient documentation

## 2018-10-27 DIAGNOSIS — M25511 Pain in right shoulder: Secondary | ICD-10-CM | POA: Diagnosis not present

## 2018-10-27 NOTE — Therapy (Signed)
[place], [place], [address] Phone: [phone]   Fax:  [phone]  Physical Therapy Treatment  Patient Details  Name: Adam Burton MRN: [id number] Date of Birth: 14-Feb-1951 Referring Provider (PT): Martyn Malay, MD   Encounter Date: 10/27/2018  PT End of Session - 10/27/18 1013    Visit Number  2    Number of Visits  15    Date for PT Re-Evaluation  12/03/18    Authorization Type  [employer] MCR, progress note at 21, KX at 92    PT Start Time  0920    PT Stop Time  1005    PT Time Calculation (min)  45 min    Activity Tolerance  Patient tolerated treatment well    Behavior During Therapy  [facility] for tasks assessed/performed       Past Medical History:  Diagnosis Date  . Hypercholesteremia   . Hypertension   . Renal insufficiency 08/21/2012    Past Surgical History:  Procedure Laterality Date  . CARDIAC CATHETERIZATION  01/2009   Dr. Martinique. Normal coronary arteries. Normal LV function.  . CORONARY ANGIOGRAPHY N/A 08/04/2018   Procedure: CORONARY ANGIOGRAPHY (CATH LAB);  Surgeon: Nigel Mormon, MD;  Location: [place] [facility];  Service: Cardiovascular;  Laterality: N/A;  . CORONARY STENT INTERVENTION N/A 08/04/2018   Procedure: CORONARY STENT INTERVENTION;  Surgeon: Nigel Mormon, MD;  Location: [place] [facility];  Service: Cardiovascular;  Laterality: N/A;  . HERNIA REPAIR    . NM MYOVIEW LTD  12/2008   Potential small area of mild ischemia in the basal inferoseptal wall. Otherwise normal. --> False positive test based on coronary angiography  . Stress Echocardiogram  12/2013   Exercise for just over 4 minutes. Heart rate increased to 148 BPM. Hypertensive response to exercise. EF 60%. No evidence of ischemia. NORMAL STUDY  . TRANSTHORACIC ECHOCARDIOGRAM  11/2013   EF 55-60%. No regional wall motion abnormality is. Normal thickness. Mild LA dilation.    There were no vitals filed for  this visit.  Subjective Assessment - 10/27/18 1010    Subjective  Pain was 8/10 when I woke up in my Rt shoulder, It is always worse first thing in the morning then it gets a little better as I move it. My TENS unit came in and I need help using it    Pertinent History  PMH: HTN, caridac cath and angiographywith stent interviention 08/04/18    Limitations  Lifting;Sitting;House hold activities    Diagnostic tests  no imaging done yet    Patient Stated Goals  ease the pain    Pain Onset  1 to 4 weeks ago                       [facility] Adult PT Treatment/Exercise - 10/27/18 0001      Exercises   Exercises  Shoulder      Shoulder Exercises: Supine   Other Supine Exercises  supine cane AAROM for flexion, scaption, ER X 10 ea      Shoulder Exercises: Seated   Other Seated Exercises  seated self GH inf glide X 10      Shoulder Exercises: Pulleys   Flexion  2 minutes    ABduction  1 minute      Modalities   Modalities  Moist Heat;Electrical Stimulation      Moist Heat Therapy   Number Minutes Moist Heat  20 Minutes  Moist Heat Location  Shoulder      Electrical Stimulation   Electrical Stimulation Location  Rt shoulder 20 min (15 min tx and 5 min education to set up his unit)    Printmaker Action  IFC    Electrical Stimulation Parameters  tolreance    Electrical Stimulation Goals  Pain      Manual Therapy   Manual therapy comments  PROM and [place] mobs             PT Education - 10/27/18 1013    Education Details  HEP review, TENS set up for his personal device    Person(s) Educated  Patient    Methods  Explanation;Demonstration;Verbal cues    Comprehension  Verbalized understanding;Returned demonstration;Need further instruction          PT Long Term Goals - 10/22/18 1115      PT LONG TERM GOAL #1   Title  Pt will be I and compliant with HEP. (Target goal for all goals 6 weeks 12/03/18)    Status  New      PT LONG TERM GOAL #2   Title   Pt will improve Rt shoulder ROM to [facility] to improve mobility    Status  New      PT LONG TERM GOAL #3   Title  Pt will improve strength to at least 5/5 MMT to improve function    Status  New      PT LONG TERM GOAL #4   Title  Pt will improve overall activity tolerance and report pain less than 2-3/10 with ususal activity.    Status  New            Plan - 10/27/18 1013    Clinical Impression Statement  Session focused on educaton and set up for his personal TENS device as well as HEP review for Rt shoulder strengthening. He was treated with MT for Rt shoulder PROM and GH mobs to inc ROM. He was encouraged to begin and start his HEP to keep his shoulder from becoming frozen.    Personal Factors and Comorbidities  Comorbidity 2;Comorbidity 1    Comorbidities  PMH: HTN, CAD caridac cath and angiographywith stent interviention 08/04/18    Examination-Activity Limitations  Carry;Lift;Reach Overhead    Examination-Participation Restrictions  Cleaning;Driving;Laundry;Yard Work;Meal Prep    Stability/Clinical Decision Making  Evolving/Moderate complexity    Rehab Potential  Good    PT Frequency  Other (comment)   2-3   PT Duration  6 weeks    PT Treatment/Interventions  Electrical Stimulation;Cryotherapy;Iontophoresis 4mg /ml Dexamethasone;Moist Heat;Ultrasound;Therapeutic activities;Therapeutic exercise;Neuromuscular re-education;Manual techniques;Joint Manipulations;Spinal Manipulations;Taping;Dry needling    PT Next Visit Plan  review HEP, consider modalties and MT for pain, needs ROM, PROM, GH mobs, RTC strength, grip strength    PT Home Exercise Plan  cane AAROM, self inf GH mobs sitting in chair, table slide abd    Consulted and Agree with Plan of Care  Patient       Patient will benefit from skilled therapeutic intervention in order to improve the following deficits and impairments:  Decreased activity tolerance, Decreased range of motion, Decreased strength, Hypomobility, Pain  Visit  Diagnosis: Acute pain of right shoulder  Stiffness of right shoulder, not elsewhere classified     Problem List Patient Active Problem List   Diagnosis Date Noted  . Rotator cuff arthropathy of right shoulder 10/11/2018  . Coronary artery disease 07/17/2018  . Coronary artery disease of native artery of native heart  with stable angina pectoris ([place]) 06/11/2018  . Alcohol use 04/24/2018  . Gastroesophageal reflux disease 10/02/2017  . Esophageal dysphagia 11/20/2016  . Corn of foot 09/19/2016  . Marijuana use 06/27/2016  . Prediabetes 10/10/2015  . Left shoulder pain 05/30/2015  . Discomfort in chest 01/26/2015  . Cataract, left eye 11/03/2014  . Gastritis 02/11/2014  . Impaired fasting glucose 10/19/2013  . Substance abuse ([place]) 09/04/2011  . Erectile dysfunction 09/04/2011  . Angina pectoris ([place]) 04/19/2011  . Hemoptysis 03/29/2011  . Colon cancer screening 03/29/2011  . Prostate cancer screening 03/29/2011  . DOE (dyspnea on exertion) 11/28/2009  . Hyperlipidemia 04/04/2009  . Benign prostatic hyperplasia 04/04/2009  . Essential hypertension 01/23/2009  . NOCTURIA 01/05/2009  . Essential hypertension, benign 01/05/2009    Silvestre Mesi 10/27/2018, 10:19 AM  [facility] [address] [place], [place], [address] Phone: [phone]   Fax:  [phone]  Name: Kayo Vidals MRN: [id number] Date of Birth: [birth year]

## 2018-10-29 ENCOUNTER — Ambulatory Visit: Payer: Medicare Other | Admitting: Physical Therapy

## 2018-10-29 ENCOUNTER — Encounter: Payer: Self-pay | Admitting: Physical Therapy

## 2018-10-29 ENCOUNTER — Other Ambulatory Visit: Payer: Self-pay

## 2018-10-29 DIAGNOSIS — M25611 Stiffness of right shoulder, not elsewhere classified: Secondary | ICD-10-CM | POA: Diagnosis not present

## 2018-10-29 DIAGNOSIS — M25511 Pain in right shoulder: Secondary | ICD-10-CM

## 2018-10-29 NOTE — Therapy (Signed)
Adam Burton, [place], [address] Phone: [phone]   Fax:  [phone]  Physical Therapy Treatment  Patient Details  Name: Adam Burton MRN: [id number] Date of Birth: 1950-05-24 Referring Provider (PT): Adam Malay, MD   Encounter Date: 10/29/2018  PT End of Session - 10/29/18 1257    Visit Number  3    Number of Visits  15    Date for PT Re-Evaluation  12/03/18    Authorization Type  [employer] MCR, progress note at 43, KX at 110    PT Start Time  1149    PT Stop Time  1228    PT Time Calculation (min)  39 min    Activity Tolerance  Patient tolerated treatment well    Behavior During Therapy  [facility] for tasks assessed/performed       Past Medical History:  Diagnosis Date  . Hypercholesteremia   . Hypertension   . Renal insufficiency 08/21/2012    Past Surgical History:  Procedure Laterality Date  . CARDIAC CATHETERIZATION  01/2009   Dr. Martinique. Normal coronary arteries. Normal LV function.  . CORONARY ANGIOGRAPHY N/A 08/04/2018   Procedure: CORONARY ANGIOGRAPHY (CATH LAB);  Surgeon: Adam Mormon, MD;  Location: [place] [facility];  Service: Cardiovascular;  Laterality: N/A;  . CORONARY STENT INTERVENTION N/A 08/04/2018   Procedure: CORONARY STENT INTERVENTION;  Surgeon: Adam Mormon, MD;  Location: [place] [facility];  Service: Cardiovascular;  Laterality: N/A;  . HERNIA REPAIR    . NM MYOVIEW LTD  12/2008   Potential small area of mild ischemia in the basal inferoseptal wall. Otherwise normal. --> False positive test based on coronary angiography  . Stress Echocardiogram  12/2013   Exercise for just over 4 minutes. Heart rate increased to 148 BPM. Hypertensive response to exercise. EF 60%. No evidence of ischemia. NORMAL STUDY  . TRANSTHORACIC ECHOCARDIOGRAM  11/2013   EF 55-60%. No regional wall motion abnormality is. Normal thickness. Mild LA dilation.    There were no vitals filed for  this visit.  Subjective Assessment - 10/29/18 1202    Subjective  Pt. reports woke up again this morning with high level of shoulder pain. Continues with pain and limitation with reaching activities.    Pertinent History  PMH: HTN, caridac cath and angiographywith stent interviention 08/04/18    Patient Stated Goals  ease the pain    Currently in Pain?  Yes    Pain Score  8     Pain Location  Shoulder    Pain Orientation  Right    Pain Descriptors / Indicators  Aching    Pain Type  Acute pain    Pain Onset  More than a month ago    Pain Frequency  Constant    Aggravating Factors   reaching    Pain Relieving Factors  medication, voltaren gel    Effect of Pain on Daily Activities  limited ability for reaching ADLs with RUE                       [facility] Adult PT Treatment/Exercise - 10/29/18 0001      Shoulder Exercises: Supine   Horizontal ABduction  Both;15 reps    Theraband Level (Shoulder Horizontal ABduction)  Level 1 (Yellow)    Other Supine Exercises  supine wand flexion x 15 short arc press and x 10 elbows extended, wand ER x 15 reps  Shoulder Exercises: Seated   Other Seated Exercises  table slide flexion AAROM stretch x 15 reps      Shoulder Exercises: Sidelying   External Rotation  AROM;Right;15 reps    ABduction Limitations  scaption AROM to 90 deg x 15 reps      Shoulder Exercises: Standing   External Rotation  Right;15 reps    Theraband Level (Shoulder External Rotation)  Level 1 (Yellow)    Internal Rotation  Right;15 reps    Theraband Level (Shoulder Internal Rotation)  Level 1 (Yellow)    Extension  Both;15 reps    Theraband Level (Shoulder Extension)  Level 2 (Red)    Row  Both;15 reps    Theraband Level (Shoulder Row)  Level 2 (Red)    Other Standing Exercises  right shoulder pendulums x 2 minutes    Other Standing Exercises  standing "bent over" row with left hand on mat x15 reps with 2 lbs., standing "bent over" shoulder extension x 15 reps  with 2 lbs.      Shoulder Exercises: Pulleys   Flexion  2 minutes    ABduction  1 minute      Electrical Stimulation   Electrical Stimulation Location  --   offered modalities but pt. deferred for home use after tx.     Manual Therapy   Manual therapy comments  right shoulder 4-way PROM, AP GH mobilization grade I-III, gentle STM posterior scapular region and subscapularis             PT Education - 10/29/18 1256    Education Details  HEP, shoulder anatomy    Person(s) Educated  Patient    Methods  Explanation;Demonstration;Verbal cues;Tactile cues    Comprehension  Verbalized understanding;Returned demonstration          PT Long Term Goals - 10/22/18 1115      PT LONG TERM GOAL #1   Title  Pt will be I and compliant with HEP. (Target goal for all goals 6 weeks 12/03/18)    Status  New      PT LONG TERM GOAL #2   Title  Pt will improve Rt shoulder ROM to [facility] to improve mobility    Status  New      PT LONG TERM GOAL #3   Title  Pt will improve strength to at least 5/5 MMT to improve function    Status  New      PT LONG TERM GOAL #4   Title  Pt will improve overall activity tolerance and report pain less than 2-3/10 with ususal activity.    Status  New            Plan - 10/29/18 1206    Clinical Impression Statement  [name] continues with a high level of shoulder pain with some capsular restriction of ROM noted during PROM assessment. Able to progress exercises as noted with some light Theraband exercises minimizing shoulder elevation but flexion limited to AAROM only due to pain. Pt. would benefit from continued PT for further progress to address functional limitations for RUE use.    Personal Factors and Comorbidities  Comorbidity 2;Comorbidity 1    Comorbidities  PMH: HTN, CAD caridac cath and angiographywith stent interviention 08/04/18    Examination-Activity Limitations  Carry;Lift;Reach Overhead    Examination-Participation Restrictions   Cleaning;Driving;Laundry;Yard Work;Meal Prep    Stability/Clinical Decision Making  Evolving/Moderate complexity    Clinical Decision Making  Moderate    Rehab Potential  Good  PT Frequency  --   2-3x/week   PT Duration  6 weeks    PT Treatment/Interventions  Electrical Stimulation;Cryotherapy;Iontophoresis 4mg /ml Dexamethasone;Moist Heat;Ultrasound;Therapeutic activities;Therapeutic exercise;Neuromuscular re-education;Manual techniques;Joint Manipulations;Spinal Manipulations;Taping;Dry needling    PT Next Visit Plan  HEP review as needed, progress AAROM, AROM and strengthening as tolerated, continue PROM and manual for stiffness, modalities prn    PT Home Exercise Plan  cane AAROM, self inf GH mobs sitting in chair, table slide abd and flexion, pendulums    Consulted and Agree with Plan of Care  Patient       Patient will benefit from skilled therapeutic intervention in order to improve the following deficits and impairments:  Decreased activity tolerance, Decreased range of motion, Decreased strength, Hypomobility, Pain  Visit Diagnosis: Acute pain of right shoulder  Stiffness of right shoulder, not elsewhere classified     Problem List Patient Active Problem List   Diagnosis Date Noted  . Rotator cuff arthropathy of right shoulder 10/11/2018  . Coronary artery disease 07/17/2018  . Coronary artery disease of native artery of native heart with stable angina pectoris ([place]) 06/11/2018  . Alcohol use 04/24/2018  . Gastroesophageal reflux disease 10/02/2017  . Esophageal dysphagia 11/20/2016  . Corn of foot 09/19/2016  . Marijuana use 06/27/2016  . Prediabetes 10/10/2015  . Left shoulder pain 05/30/2015  . Discomfort in chest 01/26/2015  . Cataract, left eye 11/03/2014  . Gastritis 02/11/2014  . Impaired fasting glucose 10/19/2013  . Substance abuse ([place]) 09/04/2011  . Erectile dysfunction 09/04/2011  . Angina pectoris ([place]) 04/19/2011  . Hemoptysis 03/29/2011  . Colon  cancer screening 03/29/2011  . Prostate cancer screening 03/29/2011  . DOE (dyspnea on exertion) 11/28/2009  . Hyperlipidemia 04/04/2009  . Benign prostatic hyperplasia 04/04/2009  . Essential hypertension 01/23/2009  . NOCTURIA 01/05/2009  . Essential hypertension, benign 01/05/2009    Beaulah Dinning, PT, DPT 10/29/18 1:01 PM  [facility] [address] [place], [place], [address] Phone: [phone]   Fax:  [phone]  Name: Adam Burton MRN: [id number] Date of Birth: 10-17-1950

## 2018-11-04 ENCOUNTER — Telehealth: Payer: Self-pay

## 2018-11-04 NOTE — Telephone Encounter (Signed)
Pt aware.

## 2018-11-04 NOTE — Telephone Encounter (Signed)
Pt says that he is having the muscle pains from the plavix; I told him to keep taking them and I will get back with him If you wanted to make any changes

## 2018-11-05 ENCOUNTER — Other Ambulatory Visit: Payer: Self-pay

## 2018-11-05 ENCOUNTER — Ambulatory Visit: Payer: Medicare Other | Admitting: Physical Therapy

## 2018-11-05 DIAGNOSIS — M25611 Stiffness of right shoulder, not elsewhere classified: Secondary | ICD-10-CM | POA: Diagnosis not present

## 2018-11-05 DIAGNOSIS — M25511 Pain in right shoulder: Secondary | ICD-10-CM | POA: Diagnosis not present

## 2018-11-05 NOTE — Therapy (Signed)
[place], [place], [address] Phone: [phone]   Fax:  [phone]  Physical Therapy Treatment  Patient Details  Name: Adam Burton MRN: [id number] Date of Birth: [birth year] Referring Provider (PT): Martyn Malay, MD   Encounter Date: 11/05/2018  PT End of Session - 11/05/18 1205    Visit Number  4    Number of Visits  15    Date for PT Re-Evaluation  12/03/18    Authorization Type  [employer] MCR, progress note at 61, KX at 7    PT Start Time  1125    PT Stop Time  1205    PT Time Calculation (min)  40 min    Activity Tolerance  Patient tolerated treatment well    Behavior During Therapy  [facility] for tasks assessed/performed       Past Medical History:  Diagnosis Date  . Hypercholesteremia   . Hypertension   . Renal insufficiency 08/21/2012    Past Surgical History:  Procedure Laterality Date  . CARDIAC CATHETERIZATION  01/2009   Dr. Martinique. Normal coronary arteries. Normal LV function.  . CORONARY ANGIOGRAPHY N/A 08/04/2018   Procedure: CORONARY ANGIOGRAPHY (CATH LAB);  Surgeon: Nigel Mormon, MD;  Location: [place] [facility];  Service: Cardiovascular;  Laterality: N/A;  . CORONARY STENT INTERVENTION N/A 08/04/2018   Procedure: CORONARY STENT INTERVENTION;  Surgeon: Nigel Mormon, MD;  Location: [place] [facility];  Service: Cardiovascular;  Laterality: N/A;  . HERNIA REPAIR    . NM MYOVIEW LTD  12/2008   Potential small area of mild ischemia in the basal inferoseptal wall. Otherwise normal. --> False positive test based on coronary angiography  . Stress Echocardiogram  12/2013   Exercise for just over 4 minutes. Heart rate increased to 148 BPM. Hypertensive response to exercise. EF 60%. No evidence of ischemia. NORMAL STUDY  . TRANSTHORACIC ECHOCARDIOGRAM  11/2013   EF 55-60%. No regional wall motion abnormality is. Normal thickness. Mild LA dilation.    There were no vitals filed  for this visit.  Subjective Assessment - 11/05/18 1140    Subjective  shoulder is getting a little better, the TENS unit helps at home, still a lot of pain after sleeping. 4/10 overall pain in his Rt shoulder today         [facility] PT Assessment - 11/05/18 0001      AROM   Right Shoulder Flexion  120 Degrees    Right Shoulder ABduction  110 Degrees    Right Shoulder Internal Rotation  --   L4   Right Shoulder External Rotation  --   C7                  [facility] Adult PT Treatment/Exercise - 11/05/18 0001      Shoulder Exercises: Supine   Horizontal ABduction  Both;20 reps    Theraband Level (Shoulder Horizontal ABduction)  Level 1 (Yellow)    Other Supine Exercises  supine wand flexion x 15 short arc press and x 10 elbows extended, wand ER x 15 reps      Shoulder Exercises: Seated   Other Seated Exercises  table slide flexion AAROM stretch x 20 reps      Shoulder Exercises: Sidelying   External Rotation  AROM;Right;20 reps    ABduction Limitations  scaption AROM to 90 deg x 20 reps      Shoulder Exercises: Standing   External Rotation  Right;15 reps;20 reps  Theraband Level (Shoulder External Rotation)  Level 1 (Yellow)    Internal Rotation  Right;20 reps    Theraband Level (Shoulder Internal Rotation)  Level 1 (Yellow)    Extension  Both;20 reps    Theraband Level (Shoulder Extension)  Level 2 (Red)    Row  Both;20 reps    Theraband Level (Shoulder Row)  Level 2 (Red)    Other Standing Exercises  right shoulder pendulums with 5 lbs X 25 ea for circles    Other Standing Exercises  UE ranger X 15 flexion and circles      Shoulder Exercises: Pulleys   Flexion  2 minutes    ABduction  2 minutes      Electrical Stimulation   Electrical Stimulation Location  --   offered modalities but pt. deferred for home use after tx.     Manual Therapy   Manual therapy comments  right shoulder 4-way PROM, AP GH mobilization grade I-III, gentle STM posterior scapular region  and subscapularis                  PT Long Term Goals - 11/05/18 1209      PT LONG TERM GOAL #1   Title  Pt will be I and compliant with HEP. (Target goal for all goals 6 weeks 12/03/18)    Status  On-going      PT LONG TERM GOAL #2   Title  Pt will improve Rt shoulder ROM to [facility] to improve mobility    Status  On-going      PT LONG TERM GOAL #3   Title  Pt will improve strength to at least 5/5 MMT to improve function    Status  On-going      PT LONG TERM GOAL #4   Title  Pt will improve overall activity tolerance and report pain less than 2-3/10 with ususal activity.    Status  On-going            Plan - 11/05/18 1206    Clinical Impression Statement  ROM progressing with updated measurements but still has deficits and capsular restriction. He had good tolerance to slight strength progression today. Encourged to continue agressive stretching at home as he is making some improvments. Continue POC    Personal Factors and Comorbidities  Comorbidity 2;Comorbidity 1    Comorbidities  PMH: HTN, CAD caridac cath and angiographywith stent interviention 08/04/18    Examination-Activity Limitations  Carry;Lift;Reach Overhead    Examination-Participation Restrictions  Cleaning;Driving;Laundry;Yard Work;Meal Prep    Stability/Clinical Decision Making  Evolving/Moderate complexity    Rehab Potential  Good    PT Frequency  --   2-3x/week   PT Duration  6 weeks    PT Treatment/Interventions  Electrical Stimulation;Cryotherapy;Iontophoresis 4mg /ml Dexamethasone;Moist Heat;Ultrasound;Therapeutic activities;Therapeutic exercise;Neuromuscular re-education;Manual techniques;Joint Manipulations;Spinal Manipulations;Taping;Dry needling    PT Next Visit Plan  HEP review as needed, progress AAROM, AROM and strengthening as tolerated, continue PROM and manual for stiffness, modalities prn    PT Home Exercise Plan  cane AAROM, self inf GH mobs sitting in chair, table slide abd and flexion,  pendulums    Consulted and Agree with Plan of Care  Patient       Patient will benefit from skilled therapeutic intervention in order to improve the following deficits and impairments:  Decreased activity tolerance, Decreased range of motion, Decreased strength, Hypomobility, Pain  Visit Diagnosis: Acute pain of right shoulder  Stiffness of right shoulder, not elsewhere classified  Problem List Patient Active Problem List   Diagnosis Date Noted  . Rotator cuff arthropathy of right shoulder 10/11/2018  . Coronary artery disease 07/17/2018  . Coronary artery disease of native artery of native heart with stable angina pectoris ([place]) 06/11/2018  . Alcohol use 04/24/2018  . Gastroesophageal reflux disease 10/02/2017  . Esophageal dysphagia 11/20/2016  . Corn of foot 09/19/2016  . Marijuana use 06/27/2016  . Prediabetes 10/10/2015  . Left shoulder pain 05/30/2015  . Discomfort in chest 01/26/2015  . Cataract, left eye 11/03/2014  . Gastritis 02/11/2014  . Impaired fasting glucose 10/19/2013  . Substance abuse ([place]) 09/04/2011  . Erectile dysfunction 09/04/2011  . Angina pectoris ([place]) 04/19/2011  . Hemoptysis 03/29/2011  . Colon cancer screening 03/29/2011  . Prostate cancer screening 03/29/2011  . DOE (dyspnea on exertion) 11/28/2009  . Hyperlipidemia 04/04/2009  . Benign prostatic hyperplasia 04/04/2009  . Essential hypertension 01/23/2009  . NOCTURIA 01/05/2009  . Essential hypertension, benign 01/05/2009    Silvestre Mesi 11/05/2018, 12:10 PM  [facility] [address] [place], [place], [address] Phone: [phone]   Fax:  [phone]  Name: Adam Burton MRN: [id number] Date of Birth: 1950-08-16

## 2018-11-09 ENCOUNTER — Telehealth: Payer: Self-pay

## 2018-11-09 NOTE — Telephone Encounter (Signed)
S/w pt advised him MP will see him with AK

## 2018-11-09 NOTE — Telephone Encounter (Signed)
Telephone encounter:  Reason for call: pt called and has questions and wanted to talk to you during his upcoming appt since you did his procedure   Usual provider: AK  Last office visit: 08/12/18  Next office visit: 11/12/18 @1  pm     Last hospitalization: 08/04/18   Current Outpatient Medications on File Prior to Visit  Medication Sig Dispense Refill  . acetaminophen (TYLENOL) 500 MG tablet Take 2 tablets (1,000 mg total) by mouth every 8 (eight) hours as needed for moderate pain. 30 tablet 0  . amLODipine (NORVASC) 5 MG tablet Take 1 tablet (5 mg total) by mouth daily. (Patient not taking: Reported on 07/30/2018) 30 tablet 1  . Ascorbic Acid (VITAMIN C) 1000 MG tablet Take 1,000 mg by mouth daily.    [name] aspirin 81 MG tablet Take 81 mg by mouth daily.     . carboxymethylcellulose (REFRESH TEARS) 0.5 % SOLN Place 1 drop into both eyes 3 (three) times daily as needed (irritation).    . clopidogrel (PLAVIX) 75 MG tablet Take 1 tablet (75 mg total) by mouth daily with breakfast. 90 tablet 2  . clopidogrel (PLAVIX) 75 MG tablet     . lisinopril-hydrochlorothiazide (ZESTORETIC) 20-25 MG tablet Take 1 tablet by mouth daily.    . metoprolol succinate (TOPROL-XL) 25 MG 24 hr tablet Take 1 tablet (25 mg total) by mouth daily. Take with or immediately following a meal. 30 tablet 2  . Misc Natural Products (PROSTATE SUPPORT PO) Take 1 tablet by mouth at bedtime.    . Multiple Vitamins-Minerals (LUTEIN-ZEAXANTHIN) TABS Take 1 tablet by mouth daily as needed (eye irritation).    . nitroGLYCERIN (NITROSTAT) 0.4 MG SL tablet Place 0.4 mg under the tongue every 5 (five) minutes as needed for chest pain.    [name] omeprazole (PRILOSEC) 20 MG capsule Take 1 capsule (20 mg total) by mouth daily. 90 capsule 3  . rosuvastatin (CRESTOR) 20 MG tablet Take 1 tablet (20 mg total) by mouth daily. 90 tablet 3  . Zinc 50 MG TABS Take 50 mg by mouth daily.     No current facility-administered medications on file prior to  visit.

## 2018-11-09 NOTE — Telephone Encounter (Signed)
Will see him with AK.  Thanks MJP

## 2018-11-10 ENCOUNTER — Ambulatory Visit: Payer: Medicare Other | Admitting: Physical Therapy

## 2018-11-10 ENCOUNTER — Other Ambulatory Visit: Payer: Self-pay

## 2018-11-10 DIAGNOSIS — M25511 Pain in right shoulder: Secondary | ICD-10-CM | POA: Diagnosis not present

## 2018-11-10 DIAGNOSIS — M25611 Stiffness of right shoulder, not elsewhere classified: Secondary | ICD-10-CM | POA: Diagnosis not present

## 2018-11-10 NOTE — Therapy (Signed)
[place], [place], [address] Phone: [phone]   Fax:  [phone]  Physical Therapy Treatment  Patient Details  Name: Rigel Koyanagi MRN: [id number] Date of Birth: [birth year] Referring Provider (PT): Martyn Malay, MD   Encounter Date: 11/10/2018  PT End of Session - 11/10/18 1233    Visit Number  5    Number of Visits  15    Date for PT Re-Evaluation  12/03/18    Authorization Type  [employer] MCR, progress note at 30, KX at 15    PT Start Time  1045    PT Stop Time  1130    PT Time Calculation (min)  45 min    Activity Tolerance  Patient tolerated treatment well    Behavior During Therapy  [facility] for tasks assessed/performed       Past Medical History:  Diagnosis Date  . Hypercholesteremia   . Hypertension   . Renal insufficiency 08/21/2012    Past Surgical History:  Procedure Laterality Date  . CARDIAC CATHETERIZATION  01/2009   Dr. Martinique. Normal coronary arteries. Normal LV function.  . CORONARY ANGIOGRAPHY N/A 08/04/2018   Procedure: CORONARY ANGIOGRAPHY (CATH LAB);  Surgeon: Nigel Mormon, MD;  Location: [place] [facility];  Service: Cardiovascular;  Laterality: N/A;  . CORONARY STENT INTERVENTION N/A 08/04/2018   Procedure: CORONARY STENT INTERVENTION;  Surgeon: Nigel Mormon, MD;  Location: [place] [facility];  Service: Cardiovascular;  Laterality: N/A;  . HERNIA REPAIR    . NM MYOVIEW LTD  12/2008   Potential small area of mild ischemia in the basal inferoseptal wall. Otherwise normal. --> False positive test based on coronary angiography  . Stress Echocardiogram  12/2013   Exercise for just over 4 minutes. Heart rate increased to 148 BPM. Hypertensive response to exercise. EF 60%. No evidence of ischemia. NORMAL STUDY  . TRANSTHORACIC ECHOCARDIOGRAM  11/2013   EF 55-60%. No regional wall motion abnormality is. Normal thickness. Mild LA dilation.    There were no vitals filed  for this visit.  Subjective Assessment - 11/10/18 1226    Subjective  Relays he woke up and his whole shoulder was hurting and swollen. He feels he needs to get xray.    Pertinent History  PMH: HTN, caridac cath and angiographywith stent interviention 08/04/18    Limitations  Lifting;Sitting;House hold activities    Diagnostic tests  no imaging done yet    Patient Stated Goals  ease the pain    Currently in Pain?  Yes    Pain Score  8     Pain Location  Shoulder    Pain Orientation  Right    Pain Descriptors / Indicators  Aching    Pain Type  Acute pain    Pain Onset  More than a month ago                       [facility] Adult PT Treatment/Exercise - 11/10/18 0001      Shoulder Exercises: Pulleys   Flexion  2 minutes    ABduction  2 minutes      Shoulder Exercises: ROM/Strengthening   UBE (Upper Arm Bike)  2 min fwd, 2 min retro    Ranger  X15 flexion, scaption, circles    Other ROM/Strengthening Exercises  wall ladder X 5 flexion and scaption    Other ROM/Strengthening Exercises  pendulums with 3 lbs for distraction X 20 cirlces,  Shoulder Exercises: IT sales professional Limitations  10 sec X 10    Cross Chest Stretch Limitations  10 sec X 10    Internal Rotation Stretch Limitations  10 sec X 10      Modalities   Modalities  Vasopneumatic      Vasopneumatic   Number Minutes Vasopneumatic   10 minutes    Vasopnuematic Location   Shoulder    Vasopneumatic Pressure  Medium    Vasopneumatic Temperature   34      Manual Therapy   Manual therapy comments  right shoulder 4-way PROM, AP GH mobilization grade I-III, Trialed KT tape for edema to Rt shoulder             PT Education - 11/10/18 1232    Education Details  Vasonpnuematic and KT tape rationale and  instructions    Person(s) Educated  Patient    Methods  Explanation    Comprehension  Verbalized understanding          PT Long Term Goals - 11/05/18 1209      PT LONG TERM GOAL #1    Title  Pt will be I and compliant with HEP. (Target goal for all goals 6 weeks 12/03/18)    Status  On-going      PT LONG TERM GOAL #2   Title  Pt will improve Rt shoulder ROM to [facility] to improve mobility    Status  On-going      PT LONG TERM GOAL #3   Title  Pt will improve strength to at least 5/5 MMT to improve function    Status  On-going      PT LONG TERM GOAL #4   Title  Pt will improve overall activity tolerance and report pain less than 2-3/10 with ususal activity.    Status  On-going            Plan - 11/10/18 1235    Clinical Impression Statement  He was in more pain today so held strengthening and instead focused on stretching to tolerance. He was trialed on vasonpnuematic and KT tape to decrease edema in his Rt shoulder.    Personal Factors and Comorbidities  Comorbidity 2;Comorbidity 1    Comorbidities  PMH: HTN, CAD caridac cath and angiographywith stent interviention 08/04/18    Examination-Activity Limitations  Carry;Lift;Reach Overhead    Examination-Participation Restrictions  Cleaning;Driving;Laundry;Yard Work;Meal Prep    Stability/Clinical Decision Making  Evolving/Moderate complexity    Rehab Potential  Good    PT Frequency  --   2-3x/week   PT Duration  6 weeks    PT Treatment/Interventions  Electrical Stimulation;Cryotherapy;Iontophoresis 4mg /ml Dexamethasone;Moist Heat;Ultrasound;Therapeutic activities;Therapeutic exercise;Neuromuscular re-education;Manual techniques;Joint Manipulations;Spinal Manipulations;Taping;Dry needling    PT Next Visit Plan  how was vaso, and KT tape, consider IONTO, HEP review as needed, progress AAROM, AROM and strengthening as tolerated, continue PROM and manual for stiffness, modalities prn    PT Home Exercise Plan  cane AAROM, self inf GH mobs sitting in chair, table slide abd and flexion, pendulums    Consulted and Agree with Plan of Care  Patient       Patient will benefit from skilled therapeutic intervention in order to  improve the following deficits and impairments:  Decreased activity tolerance, Decreased range of motion, Decreased strength, Hypomobility, Pain  Visit Diagnosis: Acute pain of right shoulder  Stiffness of right shoulder, not elsewhere classified     Problem List Patient Active Problem List   Diagnosis Date  Noted  . Rotator cuff arthropathy of right shoulder 10/11/2018  . Coronary artery disease 07/17/2018  . Coronary artery disease of native artery of native heart with stable angina pectoris ([place]) 06/11/2018  . Alcohol use 04/24/2018  . Gastroesophageal reflux disease 10/02/2017  . Esophageal dysphagia 11/20/2016  . Corn of foot 09/19/2016  . Marijuana use 06/27/2016  . Prediabetes 10/10/2015  . Left shoulder pain 05/30/2015  . Discomfort in chest 01/26/2015  . Cataract, left eye 11/03/2014  . Gastritis 02/11/2014  . Impaired fasting glucose 10/19/2013  . Substance abuse ([place]) 09/04/2011  . Erectile dysfunction 09/04/2011  . Angina pectoris ([place]) 04/19/2011  . Hemoptysis 03/29/2011  . Colon cancer screening 03/29/2011  . Prostate cancer screening 03/29/2011  . DOE (dyspnea on exertion) 11/28/2009  . Hyperlipidemia 04/04/2009  . Benign prostatic hyperplasia 04/04/2009  . Essential hypertension 01/23/2009  . NOCTURIA 01/05/2009  . Essential hypertension, benign 01/05/2009    Silvestre Mesi 11/10/2018, 12:40 PM  [facility] [address] [place], [place], [address] Phone: [phone]   Fax:  [phone]  Name: Ryyan Aredondo MRN: [id number] Date of Birth: March 21, 1950

## 2018-11-12 ENCOUNTER — Ambulatory Visit (INDEPENDENT_AMBULATORY_CARE_PROVIDER_SITE_OTHER): Payer: Medicare Other | Admitting: Family Medicine

## 2018-11-12 ENCOUNTER — Other Ambulatory Visit: Payer: Self-pay

## 2018-11-12 ENCOUNTER — Ambulatory Visit: Payer: Medicare Other | Admitting: Physical Therapy

## 2018-11-12 ENCOUNTER — Ambulatory Visit: Payer: Medicare Other | Admitting: Cardiology

## 2018-11-12 ENCOUNTER — Ambulatory Visit (INDEPENDENT_AMBULATORY_CARE_PROVIDER_SITE_OTHER): Payer: Medicare Other | Admitting: Cardiology

## 2018-11-12 ENCOUNTER — Encounter: Payer: Self-pay | Admitting: Family Medicine

## 2018-11-12 ENCOUNTER — Encounter: Payer: Self-pay | Admitting: Cardiology

## 2018-11-12 VITALS — BP 166/100 | HR 77 | Temp 96.4°F | Wt 183.3 lb

## 2018-11-12 VITALS — BP 140/80 | HR 68 | Ht 71.0 in | Wt 182.0 lb

## 2018-11-12 DIAGNOSIS — R1319 Other dysphagia: Secondary | ICD-10-CM

## 2018-11-12 DIAGNOSIS — K219 Gastro-esophageal reflux disease without esophagitis: Secondary | ICD-10-CM | POA: Diagnosis not present

## 2018-11-12 DIAGNOSIS — M7541 Impingement syndrome of right shoulder: Secondary | ICD-10-CM

## 2018-11-12 DIAGNOSIS — I2510 Atherosclerotic heart disease of native coronary artery without angina pectoris: Secondary | ICD-10-CM | POA: Diagnosis not present

## 2018-11-12 DIAGNOSIS — R1310 Dysphagia, unspecified: Secondary | ICD-10-CM

## 2018-11-12 DIAGNOSIS — M25511 Pain in right shoulder: Secondary | ICD-10-CM | POA: Diagnosis not present

## 2018-11-12 DIAGNOSIS — I10 Essential (primary) hypertension: Secondary | ICD-10-CM | POA: Diagnosis not present

## 2018-11-12 DIAGNOSIS — M25611 Stiffness of right shoulder, not elsewhere classified: Secondary | ICD-10-CM

## 2018-11-12 MED ORDER — PANTOPRAZOLE SODIUM 40 MG PO TBEC: 40.0000 mg | DELAYED_RELEASE_TABLET | Freq: Every day | ORAL | 2 refills | Status: DC

## 2018-11-12 MED ORDER — EZETIMIBE 10 MG PO TABS: 10.0000 mg | ORAL_TABLET | Freq: Every day | ORAL | 2 refills | Status: DC

## 2018-11-12 MED ORDER — LISINOPRIL-HYDROCHLOROTHIAZIDE 20-25 MG PO TABS: 1.0000 | ORAL_TABLET | Freq: Every day | ORAL | 2 refills | Status: DC

## 2018-11-12 MED ORDER — METOPROLOL SUCCINATE ER 25 MG PO TB24: 25.0000 mg | ORAL_TABLET | Freq: Every day | ORAL | 2 refills | Status: DC

## 2018-11-12 NOTE — Progress Notes (Signed)
Follow up visit  Subjective:   Adam Burton, male    DOB: [birth year], 68 y.o.   MRN: [id number]   Chief Complaint  Patient presents with  . Chest Pain  . Myalgia     HPI  68 y/o African AmericanmaleCAD s/p prox-mid LAD 07/2017, hypertension  Patient was initially seen for atypical chest pain, described as tightness after eating.  Given his risk factors, he unerwent abnormal nuclear stress test in Oct 2019, and underwent coronary CTA on 01/28/2018 revealing LAD proximal noncalcific greater than 70% stenosis, FFR 0.75, mid RCA moderate 50-69% stenosis, FFR 0.85. Subsequently, he underwent coronary angiography and successful percutaneous coronary intervention prox-mid LAD-PTCA and stent placement Resolute Onyx 3.5 X 22 mm drug-eluting stent. Medical management was recommended for small vessel diag disease, and RCA nonobstructive disease.  Patient has a myriad of complaints today.  In spite of successful PCI, patient is continued to have similar symptoms that he had before the PCI-namely chest tightness after eating.  Patient is fairly active and walks from his home to his physical therapist-which is roughly 3 miles return trip.  He denies any chest pain or shortness of breath with this level of activity.  Separately, he is having issues with his right rotator cuff, which is limiting activity of his right shoulder.  Sometimes, he describes shooting pains in right side of his chest lasting only for few seconds.  He is currently undergoing physical therapy for his rotator cuff.  Separately, he also has muscle aches and pains in both his arms since starting statins.  He has tried 2 different statins, namely atorvastatin and rosuvastatin, with similar symptoms.  Also, while he denies any exertional dyspnea, he has noticed that he has to catch his breath while talking-with or without the mask on.  On chart review, it appears that patient was evaluated by gastroenterologist Dr.Danis, back in late  2018, early 2019.  Her EGD showed normal esophagus, stomach, and duodenum, it was suspected that his heartburn symptoms were due to dysphagia and motility issues arising from gastroesophageal reflux disease.  He is currently not taking any PPI regularly.   Past Medical History:  Diagnosis Date  . Hypercholesteremia   . Hypertension   . Renal insufficiency 08/21/2012     Past Surgical History:  Procedure Laterality Date  . CARDIAC CATHETERIZATION  01/2009   Dr. Martinique. Normal coronary arteries. Normal LV function.  . CORONARY ANGIOGRAPHY N/A 08/04/2018   Procedure: CORONARY ANGIOGRAPHY (CATH LAB);  Surgeon: Nigel Mormon, MD;  Location: [place] [facility];  Service: Cardiovascular;  Laterality: N/A;  . CORONARY STENT INTERVENTION N/A 08/04/2018   Procedure: CORONARY STENT INTERVENTION;  Surgeon: Nigel Mormon, MD;  Location: [place] [facility];  Service: Cardiovascular;  Laterality: N/A;  . HERNIA REPAIR    . NM MYOVIEW LTD  12/2008   Potential small area of mild ischemia in the basal inferoseptal wall. Otherwise normal. --> False positive test based on coronary angiography  . Stress Echocardiogram  12/2013   Exercise for just over 4 minutes. Heart rate increased to 148 BPM. Hypertensive response to exercise. EF 60%. No evidence of ischemia. NORMAL STUDY  . TRANSTHORACIC ECHOCARDIOGRAM  11/2013   EF 55-60%. No regional wall motion abnormality is. Normal thickness. Mild LA dilation.     Social History   Socioeconomic History  . Marital status: Widowed    Spouse name: Not on file  . Number of children: 3  . Years of education: Not on file  .  Highest education level: Not on file  Occupational History  . Not on file  Social Needs  . Financial resource strain: Not on file  . Food insecurity    Worry: Not on file    Inability: Not on file  . Transportation needs    Medical: Not on file    Non-medical: Not on file  Tobacco Use  . Smoking status: Never Smoker  .  Smokeless tobacco: Never Used  Substance and Sexual Activity  . Alcohol use: Yes    Comment: beer or vodka occasionally  . Drug use: Yes    Types: Marijuana    Comment: 3-6 X per week  . Sexual activity: Yes  Lifestyle  . Physical activity    Days per week: Not on file    Minutes per session: Not on file  . Stress: Not on file  Relationships  . Social Herbalist on phone: Not on file    Gets together: Not on file    Attends religious service: Not on file    Active member of club or organization: Not on file    Attends meetings of clubs or organizations: Not on file    Relationship status: Not on file  . Intimate partner violence    Fear of current or ex partner: Not on file    Emotionally abused: Not on file    Physically abused: Not on file    Forced sexual activity: Not on file  Other Topics Concern  . Not on file  Social History Narrative   He is a widowed father of 41, grandfather of 3. He lives with his current girlfriend. 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     Family History  Problem Relation Age of Onset  . Heart attack Mother   . Diabetes Mother   . Diabetes Sister   . Diabetes Maternal Grandmother   . Heart attack Maternal Grandmother   . Diabetes Maternal Aunt   . Colon cancer Neg Hx   . Colon polyps Neg Hx   . Esophageal cancer Neg Hx   . Rectal cancer Neg Hx   . Stomach cancer Neg Hx      Current Outpatient Medications on File Prior to Visit  Medication Sig Dispense Refill  . acetaminophen (TYLENOL) 500 MG tablet Take 2 tablets (1,000 mg total) by mouth every 8 (eight) hours as needed for moderate pain. 30 tablet 0  . amLODipine (NORVASC) 5 MG tablet Take 1 tablet (5 mg total) by mouth daily. (Patient not taking: Reported on 07/30/2018) 30  tablet 1  . Ascorbic Acid (VITAMIN C) 1000 MG tablet Take 1,000 mg by mouth daily.    [name] aspirin 81 MG tablet Take 81 mg by mouth daily.     . carboxymethylcellulose (REFRESH TEARS) 0.5 % SOLN Place 1 drop into both eyes 3 (three) times daily as needed (irritation).    . clopidogrel (PLAVIX) 75 MG tablet Take 1 tablet (75 mg total) by mouth daily with breakfast. 90 tablet 2  . clopidogrel (PLAVIX) 75 MG tablet     . lisinopril-hydrochlorothiazide (ZESTORETIC) 20-25 MG tablet Take 1 tablet by mouth daily.    . metoprolol succinate (TOPROL-XL) 25 MG 24 hr tablet Take 1 tablet (25 mg total) by  mouth daily. Take with or immediately following a meal. 30 tablet 2  . Misc Natural Products (PROSTATE SUPPORT PO) Take 1 tablet by mouth at bedtime.    . Multiple Vitamins-Minerals (LUTEIN-ZEAXANTHIN) TABS Take 1 tablet by mouth daily as needed (eye irritation).    . nitroGLYCERIN (NITROSTAT) 0.4 MG SL tablet Place 0.4 mg under the tongue every 5 (five) minutes as needed for chest pain.    [name] omeprazole (PRILOSEC) 20 MG capsule Take 1 capsule (20 mg total) by mouth daily. 90 capsule 3  . rosuvastatin (CRESTOR) 20 MG tablet Take 1 tablet (20 mg total) by mouth daily. 90 tablet 3  . Zinc 50 MG TABS Take 50 mg by mouth daily.     No current facility-administered medications on file prior to visit.     Cardiovascular studies:  EKG 11/12/2018: Ectopic atrial rhythm 74 bpm, new since last EKG in 07/2018. Nonspecific T-abnormality.   Coronary angiography and intervention 08/04/2018: LM: Normal LAD: Prox-mid 75% stenosis. CTA FFR 0.75.         Diag 1 small vessel with 70% disease LCx: Normal RCA: Prox 60% stenosis. CTA FFR 0.85  Successful percutaneous coronary intervention prox-mid LAD PTCA and stent placement Resolute Onyx 3.5 X 22 mm drug-eluting stent Recommend medical management for small vessel diag disease, and RCA nonobstructive disease.  CTA 01/28/2018: EXAM: CT FFR ANALYSIS  CLINICAL DATA:  68 year old male with chest pain.  FINDINGS: FFRct analysis was performed on the original cardiac CT angiogram dataset. Diagrammatic representation of the FFRct analysis is provided in a separate PDF document in PACS. This dictation was created using the PDF document and an interactive 3D model of the results. 3D model is not available in the EMR/PACS. Normal FFR range is >0.80.  1. Left Main: No significant stenosis. 2. LAD: Proximal: 0.95, mid: 0.75. 3. LCX: No significant stenosis. 4. RCA: Proximal: 0.98, mid: 0.85, distal: 0.81.  IMPRESSION: 1. CT FFR analysis showed severe stenosis in the proximal LAD. A cardiac catheterization is recommended.  Exercise myoview stress 12/08/2017: 1. Resting EKG demonstrates normal sinus rhythm, LVH with nonspecific inferolateral T-wave abnormality.  Stress EKG is equivocal for ischemia with 2 mm upsloping ST segment depression noted in inferior and lateral leads that normalized to 2 minutes into recovery.  Stress symptoms included chest tightness. resting blood pressure 140/96 and peak blood pressure 134/94 mmHg.  Patient exercised for 6:54 minutes and achieved 8.43 Mets.  Stress terminated due to fatigue and 88% of MPHR achieved. 2.  The left ventricle was mildly dilated, both in rest and stress images at 124 mL. Stress and rest SPECT images demonstrate homogeneous tracer distribution throughout the myocardium. Gated SPECT imaging reveals global hypokinesis with EF calculated at 36%. 3. This is a high risk study, in view of abnormal EKG response, chest pain and reduced EF.  Echocardiogram 12/02/2013: - Left ventricle: Global LV strain is -19.4% The cavity size was  normal. Systolic function was normal. The estimated ejection  fraction was in the range of 55% to 60%. Wall motion was normal;  there were no regional wall motion abnormalities.  - Aortic valve: Trileaflet; normal thickness, mildly calcified  leaflets.  - Left atrium: The  atrium was mildly dilated.   Recent labs: Results for CIPRIANO, MILLIKAN (MRN [id number]) as of 11/12/2018 09:25  Ref. Range 07/27/2018 09:20 07/27/2018 09:21  Sodium Latest Ref Range: 134 - 144 mmol/L 139   Potassium Latest Ref Range: 3.5 - 5.2 mmol/L 3.9   Chloride Latest Ref Range:  96 - 106 mmol/L 99   CO2 Latest Ref Range: 20 - 29 mmol/L 22   Glucose Latest Ref Range: 65 - 99 mg/dL 110 (H)   BUN Latest Ref Range: 8 - 27 mg/dL 11   Creatinine Latest Ref Range: 0.76 - 1.27 mg/dL 1.36 (H)   Calcium Latest Ref Range: 8.6 - 10.2 mg/dL 9.7   BUN/Creatinine Ratio Latest Ref Range: 10 - 24  8 (L)   GFR, Est Non African American Latest Ref Range: >59 mL/min/1.73 53 (L)   GFR, Est African American Latest Ref Range: >59 mL/min/1.73 61   Total CHOL/HDL Ratio Latest Ref Range: 0.0 - 5.0 ratio  2.5  Cholesterol, Total Latest Ref Range: 100 - 199 mg/dL  143  HDL Cholesterol Latest Ref Range: >39 mg/dL  58  LDL (calc) Latest Ref Range: 0 - 99 mg/dL  68  Triglycerides Latest Ref Range: 0 - 149 mg/dL  86  VLDL Cholesterol Cal Latest Ref Range: 5 - 40 mg/dL  17   Results for SIGURD, PUGH (MRN [id number]) as of 11/12/2018 09:25  Ref. Range 07/27/2018 09:19  WBC Latest Ref Range: 3.4 - 10.8 x10E3/uL 6.7  RBC Latest Ref Range: 4.14 - 5.80 x10E6/uL 4.89  Hemoglobin Latest Ref Range: 13.0 - 17.7 g/dL 14.8  HCT Latest Ref Range: 37.5 - 51.0 % 42.4  MCV Latest Ref Range: 79 - 97 fL 87  MCH Latest Ref Range: 26.6 - 33.0 pg 30.3  MCHC Latest Ref Range: 31.5 - 35.7 g/dL 34.9  RDW Latest Ref Range: 11.6 - 15.4 % 12.8  Platelets Latest Ref Range: 150 - 450 x10E3/uL 308    Review of Systems  Constitution: Negative for decreased appetite, malaise/fatigue, weight gain and weight loss.  HENT: Negative for congestion.   Eyes: Negative for visual disturbance.  Cardiovascular: Positive for chest pain (As per HPI). Negative for leg swelling, palpitations and syncope.  Respiratory: Positive for shortness of breath (While  talking). Negative for cough.   Endocrine: Negative for cold intolerance.  Hematologic/Lymphatic: Does not bruise/bleed easily.  Skin: Negative for itching and rash.  Musculoskeletal: Positive for joint pain (Right shoulder) and myalgias (Bilateral arms).  Gastrointestinal: Negative for abdominal pain, nausea and vomiting.  Genitourinary: Negative for dysuria.  Neurological: Negative for dizziness and weakness.  Psychiatric/Behavioral: The patient is not nervous/anxious.   All other systems reviewed and are negative.        Vitals:   11/12/18 1025  BP: (!) 166/100  Pulse: 77  Temp: (!) 96.4 F (35.8 C)  SpO2: 98%     Body mass index is 25.57 kg/m. Filed Weights   11/12/18 1025  Weight: 183 lb 4.8 oz (83.1 kg)     Objective:   Physical Exam  Constitutional: He is oriented to person, place, and time. He appears well-developed and well-nourished. No distress.  HENT:  Head: Normocephalic and atraumatic.  Eyes: Pupils are equal, round, and reactive to light. Conjunctivae are normal.  Neck: No JVD present.  Cardiovascular: Normal rate, regular rhythm and intact distal pulses.  Pulmonary/Chest: Effort normal and breath sounds normal. He has no wheezes. He has no rales.  Abdominal: Soft. Bowel sounds are normal. There is no rebound.  Musculoskeletal:        General: Tenderness (Right shoulder) present. No edema.     Right shoulder: He exhibits decreased range of motion (Right shoulder).  Lymphadenopathy:    He has no cervical adenopathy.  Neurological: He is alert and oriented to person, place, and  time. No cranial nerve deficit.  Skin: Skin is warm and dry.  Psychiatric: He has a normal mood and affect.  Nursing note and vitals reviewed.         Assessment & Recommendations:   68 y/o African AmericanmaleCAD s/p prox-mid LAD 07/2017, hypertension, GERD, rotator cuff injury.  CAD: Sp prox-mid LAD PCI 07/2017. Moderate nonobstructive and small vessel disease  otherwise, being treated medically.  I do not think his current symptoms of chest tightness after eating are related to coronary artery disease.  Suspect gastroesophageal reflux disease as the etiology. Right sided chest pain lasting for a few seconds likely related to right rotator cuff injury. Continue aspirin and Plavix to December 2020.  Thereafter, Plavix can be discontinued. Recommend aggressive hypertension control.  Patient will resume metoprolol and lisinopril-HCTZ. He is intolerant to statin due to myalgias.  Discussed non-statin lipid-lowering therapy such as Zetia, as well as injectable therapy such as Repatha.  Patient would prefer Zetia at this time.  Prescription sent. Given his atypical shortness of breath on talking only, I will obtain an echocardiogram.  Suspect COPD to me playing a role.  Gastroesophageal reflux disease: Recommend pantoprazole 40 mg daily.  I will reevaluate him in 4 weeks.  If no improvement in his symptoms, he will need to be evaluated by GI again.  Hypertension: As above.  Rotator cuff pain: Management as per primary care, physical therapy.   Nigel Mormon, MD [facility] [place] Pager: [phone] Office: [phone] If no answer Cell [phone]

## 2018-11-12 NOTE — Progress Notes (Signed)
  Patient Name: Adam Burton Date of Birth: May 17, 1950 Date of Visit: 11/12/18 PCP: [name], MD  Chief Complaint: right shoulder pain   Subjective: Adam Burton is a 68 y.o. with medical history significant for hypertension, HLD, renal insufficiency and recent right heart catheter placement presenting today for follow-up for right shoulder pain concerning for rotator cuff arthropathy.   Adam Burton states that he is unable to abduct arm past 90 degree plane which makes it difficult for him to reach overhead for spices when cooking as well as cleaning high places where he has to reach overhead.  Patient reports that he has been receiving physical therapy and has been able to improve his range of motion.  Patient reports that he wakes every morning with 6-8/10 pain that is characterized as "lancing" and centered in the acromial area of his shoulder joint.  Adam Burton states that the pain improves throughout the day, he can no longer sleep lying on his right side and has started sleeping on his back.  Patient reports if he accidentally rolls on his right shoulder during the night he wakes up due to the pain.  Patient also states that he has difficulty putting his arms through his shirt due to internal rotation pain.  Patient reports he previously used Aleve that helped with the pain but is no longer able to take that because of his kidney.  Patient reports that Tylenol 1000 mg does not ease the pain.  Patient reports that he was advised to ask for shoulder x-ray by his physical therapist.  Patient plans to receive physical therapy later today.   I have reviewed the patient's medical, surgical, family, and social history as appropriate.  Vitals:   11/12/18 0858  BP: 140/80  Pulse: 68  SpO2: 100%    Physical Exam:   General: Alert and cooperative and appears to be in no acute distress MSK: Right shoulder with taping from physical therapy, no erythema, no ulcers, no lesions  visualized on exam.  Do not appreciate edema of either shoulder joint nor Mander of upper extremity.  Did not appreciate audible clicks or pops during shoulder range of motion.  Patient is able to abduct arm to 90 degrees but is not able to abduct over his head with the right shoulder.  Normal range of motion in left shoulder.  Patient has point tenderness over acromial joint and lateral edge of glenohumeral joint.  Patient has tenderness with "liftoff test" and pain with internal rotation, positive Hawkins test  Neuro: Patient is alert and oriented x3  Assessment & Plan:   Shoulder impingement syndrome, right Patient continues to report right shoulder pain that is worse with internal rotation making it difficult for him to put his arm through sleeves of his shirt patient also has pain with shoulder flexion and abduction past the 90 degrees.  Patient is currently receiving physical therapy and states that his shoulder movement is improving but he still has significant pain.  Patient requesting shoulder x-ray. -X-ray of right shoulder -referral to sports medicine, patient may benefit from ultrasound and/or MRI  -Patient encouraged to continue with physical therapy as scheduled -Patient to continue with ice and warming of shoulder joint  Return to care in 1 month.   Stark Klein, MD  Family Medicine  PGY1

## 2018-11-12 NOTE — Assessment & Plan Note (Addendum)
Patient continues to report right shoulder pain that is worse with internal rotation making it difficult for him to put his arm through sleeves of his shirt patient also has pain with shoulder flexion and abduction past the 90 degrees.  Patient is currently receiving physical therapy and states that his shoulder movement is improving but he still has significant pain.  Patient requesting shoulder x-ray. -X-ray of right shoulder -referral to sports medicine, patient may benefit from ultrasound and/or MRI  -Patient encouraged to continue with physical therapy as scheduled -Patient to continue with ice and warming of shoulder joint

## 2018-11-12 NOTE — Therapy (Signed)
[place], [place], [address] Phone: [phone]   Fax:  [phone]  Physical Therapy Treatment  Patient Details  Name: Adam Burton MRN: [id number] Date of Birth: 1950/08/01 Referring Provider (PT): Martyn Malay, MD   Encounter Date: 11/12/2018  PT End of Session - 11/12/18 1211    Visit Number  6    Number of Visits  15    Date for PT Re-Evaluation  12/03/18    Authorization Type  [employer] MCR, progress note at 51, KX at 15    PT Start Time  1145    PT Stop Time  1230    PT Time Calculation (min)  45 min    Activity Tolerance  Patient tolerated treatment well    Behavior During Therapy  [facility] for tasks assessed/performed       Past Medical History:  Diagnosis Date  . Hypercholesteremia   . Hypertension   . Renal insufficiency 08/21/2012    Past Surgical History:  Procedure Laterality Date  . CARDIAC CATHETERIZATION  01/2009   Dr. [place]. Normal coronary arteries. Normal LV function.  . CORONARY ANGIOGRAPHY N/A 08/04/2018   Procedure: CORONARY ANGIOGRAPHY (CATH LAB);  Surgeon: Nigel Mormon, MD;  Location: [facility];  Service: Cardiovascular;  Laterality: N/A;  . CORONARY STENT INTERVENTION N/A 08/04/2018   Procedure: CORONARY STENT INTERVENTION;  Surgeon: Nigel Mormon, MD;  Location: [facility];  Service: Cardiovascular;  Laterality: N/A;  . HERNIA REPAIR    . NM MYOVIEW LTD  12/2008   Potential small area of mild ischemia in the basal inferoseptal wall. Otherwise normal. --> False positive test based on coronary angiography  . Stress Echocardiogram  12/2013   Exercise for just over 4 minutes. Heart rate increased to 148 BPM. Hypertensive response to exercise. EF 60%. No evidence of ischemia. NORMAL STUDY  . TRANSTHORACIC ECHOCARDIOGRAM  11/2013   EF 55-60%. No regional wall motion abnormality is. Normal thickness. Mild LA dilation.    There were no vitals filed  for this visit.  Subjective Assessment - 11/12/18 1139    Subjective  He relays he saw MD again due to pain and will be set up to have XRay on his shoulder. He relays he also saw cardiologist and will get set up to have echocardiogram. He relays 7/10 pain in his shoulder but the vasopnuematic helped last time and the swelling is down.    Pertinent History  PMH: HTN, caridac cath and angiographywith stent interviention 08/04/18    Limitations  Lifting;Sitting;House hold activities    Diagnostic tests  no imaging done yet    Patient Stated Goals  ease the pain                       [facility] Adult PT Treatment/Exercise - 11/12/18 0001      Shoulder Exercises: Supine   Other Supine Exercises  wand flexion AAROM with 3 lbs X 15 reps      Shoulder Exercises: Standing   Other Standing Exercises  right shoulder pendulums with 5 lbs X 25 ea for circles, bent over row  x20, bicep curl  x20, bent over tricep kickback 2X10 all with 5 lbs      Shoulder Exercises: Pulleys   Flexion  2 minutes    ABduction  2 minutes      Shoulder Exercises: ROM/Strengthening   UBE (Upper Arm Bike)  2 min fwd, 2 min retro  [place]  X20 flexion, scaption, circles    Other ROM/Strengthening Exercises  wall ladder X 5 flexion and scaption    Other ROM/Strengthening Exercises  --      Shoulder Exercises: Stretch   Corner Stretch Limitations  --    Cross Chest Stretch Limitations  --    Internal Rotation Stretch Limitations  --      Modalities   Modalities  Vasopneumatic;Iontophoresis      Iontophoresis   Type of Iontophoresis  Dexamethasone    Location  Rt shoulder    Dose  1.0 CC    Time  4-6 hour wear home patch      Vasopneumatic   Number Minutes Vasopneumatic   10 minutes    Vasopnuematic Location   Shoulder    Vasopneumatic Pressure  Medium    Vasopneumatic Temperature   34      Manual Therapy   Manual therapy comments  right shoulder 4-way PROM, AP GH mobilization grade I-III, Trialed  KT tape for edema to Rt shoulder             PT Education - 11/12/18 1211    Education Details  IONTO rationale and instructions    Person(s) Educated  Patient    Methods  Explanation    Comprehension  Verbalized understanding          PT Long Term Goals - 11/05/18 1209      PT LONG TERM GOAL #1   Title  Pt will be I and compliant with HEP. (Target goal for all goals 6 weeks 12/03/18)    Status  On-going      PT LONG TERM GOAL #2   Title  Pt will improve Rt shoulder ROM to [facility] to improve mobility    Status  On-going      PT LONG TERM GOAL #3   Title  Pt will improve strength to at least 5/5 MMT to improve function    Status  On-going      PT LONG TERM GOAL #4   Title  Pt will improve overall activity tolerance and report pain less than 2-3/10 with ususal activity.    Status  On-going            Plan - 11/12/18 1212    Clinical Impression Statement  He continues to have limited ROM in his Rt shoulder and increased pain. He expressed some relief with vasopnuematic machine so this was continued and he was trialed with Ionto patch for pain and inflammaitn. Continued with stretching program and added strengthening for bicep, tricep, and scapular.    Personal Factors and Comorbidities  Comorbidity 2;Comorbidity 1    Comorbidities  PMH: HTN, CAD caridac cath and angiographywith stent interviention 08/04/18    Examination-Activity Limitations  Carry;Lift;Reach Overhead    Examination-Participation Restrictions  Cleaning;Driving;Laundry;Yard Work;Meal Prep    Stability/Clinical Decision Making  Evolving/Moderate complexity    Rehab Potential  Good    PT Frequency  --   2-3x/week   PT Duration  6 weeks    PT Treatment/Interventions  Electrical Stimulation;Cryotherapy;Iontophoresis 4mg /ml Dexamethasone;Moist Heat;Ultrasound;Therapeutic activities;Therapeutic exercise;Neuromuscular re-education;Manual techniques;Joint Manipulations;Spinal Manipulations;Taping;Dry needling     PT Next Visit Plan  how was vaso, and KT tape, consider IONTO, HEP review as needed, progress AAROM, AROM and strengthening as tolerated, continue PROM and manual for stiffness, modalities prn    PT Home Exercise Plan  cane AAROM, self inf GH mobs sitting in chair, table slide abd and flexion, pendulums  Consulted and Agree with Plan of Care  Patient       Patient will benefit from skilled therapeutic intervention in order to improve the following deficits and impairments:  Decreased activity tolerance, Decreased range of motion, Decreased strength, Hypomobility, Pain  Visit Diagnosis: Acute pain of right shoulder  Stiffness of right shoulder, not elsewhere classified     Problem List Patient Active Problem List   Diagnosis Date Noted  . Shoulder impingement syndrome, right 10/11/2018  . Coronary artery disease 07/17/2018  . Coronary artery disease of native artery of native heart with stable angina pectoris ([place]) 06/11/2018  . Alcohol use 04/24/2018  . Gastroesophageal reflux disease 10/02/2017  . Esophageal dysphagia 11/20/2016  . Corn of foot 09/19/2016  . Marijuana use 06/27/2016  . Prediabetes 10/10/2015  . Left shoulder pain 05/30/2015  . Discomfort in chest 01/26/2015  . Cataract, left eye 11/03/2014  . Gastritis 02/11/2014  . Impaired fasting glucose 10/19/2013  . Substance abuse ([place]) 09/04/2011  . Erectile dysfunction 09/04/2011  . Hemoptysis 03/29/2011  . Colon cancer screening 03/29/2011  . Prostate cancer screening 03/29/2011  . DOE (dyspnea on exertion) 11/28/2009  . Hyperlipidemia 04/04/2009  . Benign prostatic hyperplasia 04/04/2009  . Essential hypertension 01/23/2009  . NOCTURIA 01/05/2009  . Essential hypertension, benign 01/05/2009    Debbe Odea ,PT,DPT 11/12/2018, 12:19 PM  [facility] [address] [place], [place], [address] Phone: [phone]   Fax:  [phone]  Name: Bleu Reichel MRN: [id number] Date of Birth: 1951/01/30

## 2018-11-12 NOTE — Patient Instructions (Addendum)
It was a pleasure to see you today! Thank you for choosing [facility] for your primary care. Adam Burton was seen for right shoulder pain.   Our plans for today were:  Referral for continued physical therapy   Continue to rest shoulder and apply ice when aggravated. Can also continue to use Tylenol simultaneously with voltaren gel to help with pain in mean time.   I am sending a referral for sports medicine for further evaluation as you may benefit from an ultrasound.   We will obtain a shoulder xray.   To keep you healthy, we need to monitor some screening tests.   You are due for :  1. Colonoscopy  2. Influenza Vaccine 3. Tetanus Vaccine   You should return to our clinic in 1 month for follow up of your shoulder pain.   Best,  Dr. Rosita Fire  Shoulder Pain Many things can cause shoulder pain, including:  An injury.  Moving the shoulder in the same way again and again (overuse).  Joint pain (arthritis). Pain can come from:  Swelling and irritation (inflammation) of any part of the shoulder.  An injury to the shoulder joint.  An injury to: ? Tissues that connect muscle to bone (tendons). ? Tissues that connect bones to each other (ligaments). ? Bones. Follow these instructions at home: Watch for changes in your symptoms. Let your doctor know about them. Follow these instructions to help with your pain. If you have a sling:  Wear the sling as told by your doctor. Remove it only as told by your doctor.  Loosen the sling if your fingers: ? Tingle. ? Become numb. ? Turn cold and blue.  Keep the sling clean.  If the sling is not waterproof: ? Do not let it get wet. ? Take the sling off when you shower or bathe. Managing pain, stiffness, and swelling   If told, put ice on the painful area: ? Put ice in a plastic bag. ? Place a towel between your skin and the bag. ? Leave the ice on for 20 minutes, 2-3 times a day. Stop putting ice on if it does not  help with the pain.  Squeeze a soft ball or a foam pad as much as possible. This prevents swelling in the shoulder. It also helps to strengthen the arm. General instructions  Take over-the-counter and prescription medicines only as told by your doctor.  Keep all follow-up visits as told by your doctor. This is important. Contact a doctor if:  Your pain gets worse.  Medicine does not help your pain.  You have new pain in your arm, hand, or fingers. Get help right away if:  Your arm, hand, or fingers: ? Tingle. ? Are numb. ? Are swollen. ? Are painful. ? Turn white or blue. Summary  Shoulder pain can be caused by many things. These include injury, moving the shoulder in the same away again and again, and joint pain.  Watch for changes in your symptoms. Let your doctor know about them.  This condition may be treated with a sling, ice, and pain medicine.  Contact your doctor if the pain gets worse or you have new pain. Get help right away if your arm, hand, or fingers tingle or get numb, swollen, or painful.  Keep all follow-up visits as told by your doctor. This is important. This information is not intended to replace advice given to you by your health care provider. Make sure you discuss any questions you  have with your health care provider. Document Released: 07/31/2007 Document Revised: 08/26/2017 Document Reviewed: 08/26/2017 Elsevier Patient Education  2020 [employer].  Shoulder Impingement Syndrome  Shoulder impingement syndrome is a condition that causes pain when connective tissues (tendons) surrounding the shoulder joint become pinched. These tendons are part of the group of muscles and tissues that help to stabilize the shoulder (rotator cuff). Beneath the rotator cuff is a fluid-filled sac (bursa) that allows the muscles and tendons to glide smoothly. The bursa may become swollen or irritated (bursitis). Bursitis, swelling in the rotator cuff tendons, or both  conditions can decrease how much space is under a bone in the shoulder joint (acromion), resulting in impingement. What are the causes? Shoulder impingement syndrome may be caused by bursitis or swelling of the rotator cuff tendons, which may result from:  Repetitive overhead arm movements.  Falling onto the shoulder.  Weakness in the shoulder muscles. What increases the risk? You may be more likely to develop this condition if you:  Play sports that involve throwing, such as baseball.  Participate in sports such as tennis, volleyball, and swimming.  Work as a Curator, Games developer, or Architect. Some people are also more likely to develop impingement syndrome because of the shape of their acromion bone. What are the signs or symptoms? The main symptom of this condition is pain on the front or side of the shoulder. The pain may:  Get worse when lifting or raising the arm.  Get worse at night.  Wake you up from sleeping.  Feel sharp when the shoulder is moved and then fade to an ache. Other symptoms may include:  Tenderness.  Stiffness.  Inability to raise the arm above shoulder level or behind the body.  Weakness. How is this diagnosed? This condition may be diagnosed based on:  Your symptoms and medical history.  A physical exam.  Imaging tests, such as: ? X-rays. ? MRI. ? Ultrasound. How is this treated? This condition may be treated by:  Resting your shoulder and avoiding all activities that cause pain or put stress on the shoulder.  Icing your shoulder.  NSAIDs to help reduce pain and swelling.  One or more injections of medicines to numb the area and reduce inflammation.  Physical therapy.  Surgery. This may be needed if nonsurgical treatments have not helped. Surgery may involve repairing the rotator cuff, reshaping the acromion, or removing the bursa. Follow these instructions at home: Managing pain, stiffness, and swelling   If directed, put  ice on the injured area. ? Put ice in a plastic bag. ? Place a towel between your skin and the bag. ? Leave the ice on for 20 minutes, 2-3 times a day. Activity  Rest and return to your normal activities as told by your health care provider. Ask your health care provider what activities are safe for you.  Do exercises as told by your health care provider. General instructions  Do not use any products that contain nicotine or tobacco, such as cigarettes, e-cigarettes, and chewing tobacco. These can delay healing. If you need help quitting, ask your health care provider.  Ask your health care provider when it is safe for you to drive.  Take over-the-counter and prescription medicines only as told by your health care provider.  Keep all follow-up visits as told by your health care provider. This is important. How is this prevented?  Give your body time to rest between periods of activity.  Be safe and responsible while  being active. This will help you avoid falls.  Maintain physical fitness, including strength and flexibility. Contact a health care provider if:  Your symptoms have not improved after 1-2 months of treatment and rest.  You cannot lift your arm away from your body. Summary  Shoulder impingement syndrome is a condition that causes pain when connective tissues (tendons) surrounding the shoulder joint become pinched.  The main symptom of this condition is pain on the front or side of the shoulder.  This condition is usually treated with rest, ice, and pain medicines as needed. This information is not intended to replace advice given to you by your health care provider. Make sure you discuss any questions you have with your health care provider. Document Released: 02/11/2005 Document Revised: 06/05/2018 Document Reviewed: 08/06/2017 Elsevier Patient Education  2020 [employer].

## 2018-11-17 ENCOUNTER — Ambulatory Visit
Admission: RE | Admit: 2018-11-17 | Discharge: 2018-11-17 | Disposition: A | Discharge status: Home / Self Care | Payer: Medicare Other | Source: Ambulatory Visit | Attending: Family Medicine | Admitting: Family Medicine

## 2018-11-17 ENCOUNTER — Ambulatory Visit: Payer: Medicare Other | Admitting: Physical Therapy

## 2018-11-17 ENCOUNTER — Other Ambulatory Visit: Payer: Self-pay

## 2018-11-17 DIAGNOSIS — M25511 Pain in right shoulder: Secondary | ICD-10-CM

## 2018-11-17 DIAGNOSIS — M7541 Impingement syndrome of right shoulder: Secondary | ICD-10-CM

## 2018-11-17 DIAGNOSIS — M25611 Stiffness of right shoulder, not elsewhere classified: Secondary | ICD-10-CM | POA: Diagnosis not present

## 2018-11-17 NOTE — Therapy (Signed)
[place], [place], [address] Phone: [phone]   Fax:  [phone]  Physical Therapy Treatment  Patient Details  Name: Maximilien Gines MRN: [id number] Date of Birth: 01-25-1951 Referring Provider (PT): Martyn Malay, MD   Encounter Date: 11/17/2018  PT End of Session - 11/17/18 1409    Visit Number  7    Number of Visits  15    Date for PT Re-Evaluation  12/03/18    Authorization Type  [employer] MCR, progress note at 71, KX at 15    PT Start Time  1000    PT Stop Time  1045    PT Time Calculation (min)  45 min    Activity Tolerance  Patient tolerated treatment well    Behavior During Therapy  [facility] for tasks assessed/performed       Past Medical History:  Diagnosis Date  . Hypercholesteremia   . Hypertension   . Renal insufficiency 08/21/2012    Past Surgical History:  Procedure Laterality Date  . CARDIAC CATHETERIZATION  01/2009   Dr. Martinique. Normal coronary arteries. Normal LV function.  . CORONARY ANGIOGRAPHY N/A 08/04/2018   Procedure: CORONARY ANGIOGRAPHY (CATH LAB);  Surgeon: Nigel Mormon, MD;  Location: [place] [facility];  Service: Cardiovascular;  Laterality: N/A;  . CORONARY STENT INTERVENTION N/A 08/04/2018   Procedure: CORONARY STENT INTERVENTION;  Surgeon: Nigel Mormon, MD;  Location: [place] [facility];  Service: Cardiovascular;  Laterality: N/A;  . HERNIA REPAIR    . NM MYOVIEW LTD  12/2008   Potential small area of mild ischemia in the basal inferoseptal wall. Otherwise normal. --> False positive test based on coronary angiography  . Stress Echocardiogram  12/2013   Exercise for just over 4 minutes. Heart rate increased to 148 BPM. Hypertensive response to exercise. EF 60%. No evidence of ischemia. NORMAL STUDY  . TRANSTHORACIC ECHOCARDIOGRAM  11/2013   EF 55-60%. No regional wall motion abnormality is. Normal thickness. Mild LA dilation.    There were no vitals filed  for this visit.  Subjective Assessment - 11/17/18 1354    Subjective  The pain is a little better, a little more ROM, but still cant sleep good at night due to pain and it always hurts the worse in the morning. Pain overall 6/10 in Rt shoulder    Pertinent History  PMH: HTN, caridac cath and angiographywith stent interviention 08/04/18    Limitations  Lifting;Sitting;House hold activities    Diagnostic tests  no imaging done yet    Patient Stated Goals  ease the pain    Pain Onset  More than a month ago                       [facility] Adult PT Treatment/Exercise - 11/17/18 0001      Shoulder Exercises: Supine   Other Supine Exercises  wand flexion AAROM with 4 lbs X 15 reps      Shoulder Exercises: Pulleys   Flexion  2 minutes    ABduction  2 minutes      Shoulder Exercises: ROM/Strengthening   UBE (Upper Arm Bike)  2 min fwd, 2 min retro    Ranger  X20 flexion, scaption, circles    Other ROM/Strengthening Exercises  wall ladder X 5 flexion and scaption      Shoulder Exercises: Stretch   Corner Stretch Limitations  10 sec X 10 supine star gazer    Cross Chest  Stretch Limitations  10 sec X 10      Vasopneumatic   Number Minutes Vasopneumatic   10 minutes    Vasopnuematic Location   Shoulder    Vasopneumatic Pressure  Medium    Vasopneumatic Temperature   34      Manual Therapy   Manual therapy comments  right shoulder 4-way PROM, AP GH mobilization grade I-III, Trialed KT tape for edema to Rt shoulder                  PT Long Term Goals - 11/05/18 1209      PT LONG TERM GOAL #1   Title  Pt will be I and compliant with HEP. (Target goal for all goals 6 weeks 12/03/18)    Status  On-going      PT LONG TERM GOAL #2   Title  Pt will improve Rt shoulder ROM to [facility] to improve mobility    Status  On-going      PT LONG TERM GOAL #3   Title  Pt will improve strength to at least 5/5 MMT to improve function    Status  On-going      PT LONG TERM GOAL #4    Title  Pt will improve overall activity tolerance and report pain less than 2-3/10 with ususal activity.    Status  On-going            Plan - 11/17/18 1410    Clinical Impression Statement  Some improvements with ROM but still capsular pattern and will continue to benefit from PT. He does not think Ionto did much so this was not performed again today, instead session focused heavy on stretching to tolerance. Performed vaso at end for pain and inflammation    Personal Factors and Comorbidities  Comorbidity 2;Comorbidity 1    Comorbidities  PMH: HTN, CAD caridac cath and angiographywith stent interviention 08/04/18    Examination-Activity Limitations  Carry;Lift;Reach Overhead    Examination-Participation Restrictions  Cleaning;Driving;Laundry;Yard Work;Meal Prep    Stability/Clinical Decision Making  Evolving/Moderate complexity    Rehab Potential  Good    PT Frequency  --   2-3x/week   PT Duration  6 weeks    PT Treatment/Interventions  Electrical Stimulation;Cryotherapy;Iontophoresis 4mg /ml Dexamethasone;Moist Heat;Ultrasound;Therapeutic activities;Therapeutic exercise;Neuromuscular re-education;Manual techniques;Joint Manipulations;Spinal Manipulations;Taping;Dry needling    PT Next Visit Plan  how was vaso, and KT tape, consider IONTO, HEP review as needed, progress AAROM, AROM and strengthening as tolerated, continue PROM and manual for stiffness, modalities prn    PT Home Exercise Plan  cane AAROM, self inf GH mobs sitting in chair, table slide abd and flexion, pendulums    Consulted and Agree with Plan of Care  Patient       Patient will benefit from skilled therapeutic intervention in order to improve the following deficits and impairments:  Decreased activity tolerance, Decreased range of motion, Decreased strength, Hypomobility, Pain  Visit Diagnosis: Acute pain of right shoulder  Stiffness of right shoulder, not elsewhere classified     Problem List Patient Active  Problem List   Diagnosis Date Noted  . Shoulder impingement syndrome, right 10/11/2018  . Coronary artery disease 07/17/2018  . Coronary artery disease of native artery of native heart with stable angina pectoris ([place]) 06/11/2018  . Alcohol use 04/24/2018  . Gastroesophageal reflux disease 10/02/2017  . Esophageal dysphagia 11/20/2016  . Corn of foot 09/19/2016  . Marijuana use 06/27/2016  . Prediabetes 10/10/2015  . Left shoulder pain 05/30/2015  .  Discomfort in chest 01/26/2015  . Cataract, left eye 11/03/2014  . Gastritis 02/11/2014  . Impaired fasting glucose 10/19/2013  . Substance abuse ([place]) 09/04/2011  . Erectile dysfunction 09/04/2011  . Hemoptysis 03/29/2011  . Colon cancer screening 03/29/2011  . Prostate cancer screening 03/29/2011  . DOE (dyspnea on exertion) 11/28/2009  . Hyperlipidemia 04/04/2009  . Benign prostatic hyperplasia 04/04/2009  . Essential hypertension 01/23/2009  . NOCTURIA 01/05/2009  . Essential hypertension, benign 01/05/2009    Silvestre Mesi 11/17/2018, 2:12 PM  [facility] [address] [place], [place], [address] Phone: [phone]   Fax:  [phone]  Name: Nashton Kwasniewski MRN: [id number] Date of Birth: [birth year]

## 2018-11-19 ENCOUNTER — Ambulatory Visit: Payer: Medicare Other | Admitting: Physical Therapy

## 2018-11-19 ENCOUNTER — Telehealth: Payer: Self-pay | Admitting: Family Medicine

## 2018-11-19 NOTE — Telephone Encounter (Signed)
Patient would like a call back with the results of his recent x-ray.  Thank you.

## 2018-11-19 NOTE — Telephone Encounter (Signed)
Routing to provider who saw him and ordered xray. [name], MD

## 2018-11-24 ENCOUNTER — Ambulatory Visit: Payer: Medicare Other | Admitting: Physical Therapy

## 2018-11-24 ENCOUNTER — Other Ambulatory Visit: Payer: Medicare Other

## 2018-11-25 ENCOUNTER — Ambulatory Visit: Payer: Medicare Other | Admitting: Physical Therapy

## 2018-11-25 ENCOUNTER — Ambulatory Visit: Payer: Medicare Other | Admitting: Sports Medicine

## 2018-11-25 ENCOUNTER — Telehealth: Payer: Self-pay | Admitting: Physical Therapy

## 2018-11-25 NOTE — Telephone Encounter (Signed)
Called and spoke with patient regarding no show for 3:45 appointment. He reports forgot/needs to reschedule. Confirmed next appointment time-he reports may try to call to reschedule for later in day if opening available.

## 2018-11-27 ENCOUNTER — Other Ambulatory Visit: Payer: Self-pay

## 2018-11-27 ENCOUNTER — Ambulatory Visit: Payer: Medicare Other | Attending: Family Medicine | Admitting: Physical Therapy

## 2018-11-27 ENCOUNTER — Telehealth: Payer: Self-pay | Admitting: Physical Therapy

## 2018-11-27 ENCOUNTER — Encounter: Payer: Self-pay | Admitting: Sports Medicine

## 2018-11-27 ENCOUNTER — Ambulatory Visit (INDEPENDENT_AMBULATORY_CARE_PROVIDER_SITE_OTHER): Payer: Medicare Other | Admitting: Sports Medicine

## 2018-11-27 VITALS — BP 144/92 | Ht 71.0 in | Wt 181.0 lb

## 2018-11-27 DIAGNOSIS — M75111 Incomplete rotator cuff tear or rupture of right shoulder, not specified as traumatic: Secondary | ICD-10-CM | POA: Diagnosis not present

## 2018-11-27 DIAGNOSIS — M25511 Pain in right shoulder: Secondary | ICD-10-CM

## 2018-11-27 NOTE — Progress Notes (Addendum)
PCP: [name], MD  Subjective:   HPI: Patient is a 68 y.o. male here for evaluation of right shoulder pain.  Pain is been present for last several months.  Is located on lateral aspect of his shoulder and radiates partially down his arm.  He notes significant nighttime pain as well as pain when reaching above his head and behind his back.  Patient denies any numbness or tingling.  He has no bruising or swelling.  Patient denies any injury or trauma to his shoulder.  Patient is unable to take NSAIDs due to history of coronary artery disease but has been taking Tylenol which does not help his pain.  His PCP referred him to physical therapy but notes this has not improved his pain.  He had an x-ray done of his right shoulder several weeks ago which did not show any acute bony abnormality.  Patient notes the pain has a sharp stabbing quality to it.   Review of Systems: See HPI above.  Past Medical History:  Diagnosis Date  . Hypercholesteremia   . Hypertension   . Renal insufficiency 08/21/2012    Current Outpatient Medications on File Prior to Visit  Medication Sig Dispense Refill  . acetaminophen (TYLENOL) 500 MG tablet Take 2 tablets (1,000 mg total) by mouth every 8 (eight) hours as needed for moderate pain. 30 tablet 0  . aspirin 81 MG tablet Take 81 mg by mouth daily.     . clopidogrel (PLAVIX) 75 MG tablet Take 1 tablet (75 mg total) by mouth daily with breakfast. 90 tablet 2  . ezetimibe (ZETIA) 10 MG tablet Take 1 tablet (10 mg total) by mouth daily. 30 tablet 2  . ISOtretinoin (ACCUTANE) 30 MG capsule Take 30 mg by mouth 2 (two) times daily.    [name] lisinopril-hydrochlorothiazide (ZESTORETIC) 20-25 MG tablet Take 1 tablet by mouth daily. 30 tablet 2  . metoprolol succinate (TOPROL-XL) 25 MG 24 hr tablet Take 1 tablet (25 mg total) by mouth daily. Take with or immediately following a meal. 30 tablet 2  . Misc Natural Products (PROSTATE SUPPORT PO) Take 1 tablet by mouth at  bedtime.    . nitroGLYCERIN (NITROSTAT) 0.4 MG SL tablet Place 0.4 mg under the tongue every 5 (five) minutes as needed for chest pain.    [name] omeprazole (PRILOSEC) 20 MG capsule Take 1 capsule (20 mg total) by mouth daily. 90 capsule 3  . pantoprazole (PROTONIX) 40 MG tablet Take 1 tablet (40 mg total) by mouth daily. 30 tablet 2  . Ascorbic Acid (VITAMIN C) 1000 MG tablet Take 1,000 mg by mouth daily.    . carboxymethylcellulose (REFRESH TEARS) 0.5 % SOLN Place 1 drop into both eyes 3 (three) times daily as needed (irritation).    . Multiple Vitamins-Minerals (LUTEIN-ZEAXANTHIN) TABS Take 1 tablet by mouth daily as needed (eye irritation).    . Zinc 50 MG TABS Take 50 mg by mouth daily.     No current facility-administered medications on file prior to visit.     Past Surgical History:  Procedure Laterality Date  . CARDIAC CATHETERIZATION  01/2009   Dr. Martinique. Normal coronary arteries. Normal LV function.  . CORONARY ANGIOGRAPHY N/A 08/04/2018   Procedure: CORONARY ANGIOGRAPHY (CATH LAB);  Surgeon: Nigel Mormon, MD;  Location: [place] [facility];  Service: Cardiovascular;  Laterality: N/A;  . CORONARY STENT INTERVENTION N/A 08/04/2018   Procedure: CORONARY STENT INTERVENTION;  Surgeon: Nigel Mormon, MD;  Location: [place] [facility];  Service: Cardiovascular;  Laterality: N/A;  . HERNIA REPAIR    . NM MYOVIEW LTD  12/2008   Potential small area of mild ischemia in the basal inferoseptal wall. Otherwise normal. --> False positive test based on coronary angiography  . Stress Echocardiogram  12/2013   Exercise for just over 4 minutes. Heart rate increased to 148 BPM. Hypertensive response to exercise. EF 60%. No evidence of ischemia. NORMAL STUDY  . TRANSTHORACIC ECHOCARDIOGRAM  11/2013   EF 55-60%. No regional wall motion abnormality is. Normal thickness. Mild LA dilation.    No Known Allergies  Social History   Socioeconomic History  . Marital status: Widowed    Spouse  name: Not on file  . Number of children: 3  . Years of education: Not on file  . Highest education level: Not on file  Occupational History  . Not on file  Social Needs  . Financial resource strain: Not on file  . Food insecurity    Worry: Not on file    Inability: Not on file  . Transportation needs    Medical: Not on file    Non-medical: Not on file  Tobacco Use  . Smoking status: Never Smoker  . Smokeless tobacco: Never Used  Substance and Sexual Activity  . Alcohol use: Yes    Comment: beer or vodka occasionally  . Drug use: Yes    Types: Marijuana    Comment: 3-6 X per week  . Sexual activity: Yes  Lifestyle  . Physical activity    Days per week: Not on file    Minutes per session: Not on file  . Stress: Not on file  Relationships  . Social Herbalist on phone: Not on file    Gets together: Not on file    Attends religious service: Not on file    Active member of club or organization: Not on file    Attends meetings of clubs or organizations: Not on file    Relationship status: Not on file  . Intimate partner violence    Fear of current or ex partner: Not on file    Emotionally abused: Not on file    Physically abused: Not on file    Forced sexual activity: Not on file  Other Topics Concern  . Not on file  Social History Narrative   He is a widowed father of 28, grandfather of 3. He lives with his current girlfriend. 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    Family History  Problem Relation Age of Onset  . Heart attack Mother   . Diabetes Mother   . Diabetes Sister   . Diabetes Maternal Grandmother   . Heart attack Maternal Grandmother   . Diabetes Maternal Aunt   . Colon cancer Neg Hx   . Colon polyps Neg Hx   . Esophageal cancer Neg Hx   .  Rectal cancer Neg Hx   . Stomach cancer Neg Hx         Objective:  Physical Exam: BP (!) 144/92   Ht 5\' 11"  (1.803 m)   Wt 181 lb (82.1 kg)   BMI 25.24 kg/m  Gen: NAD, comfortable in exam room Lungs: Breathing comfortably on room air Shoulder Exam right -Inspection: No discoloration,  no deformity -Palpation: No tenderness to palpation -ROM (active): Abduction: 90 degrees; Forward Flexion: 90 degrees; unable to reach behind back -ROM (passive): Same as active. Limited due to patient discomfort -Strength: Abduction: 4/5; Forward Flexion: 4/5; Internal Rotation: 4/5; External Rotation: 5/5 -Special Tests: Hawkins: Positive; Neers: Positive; Jobs: Positive with weakness; O'briens: Negative; Speeds: Negative; Yergasons: Negative; Cross arm: negative -Scapular Motion: Normal alignment. No notable protraction/retraction. No scapular winging -Limb neurovascularly intact, no instability noted  Contralateral Shoulder -Inspection: No discoloration, no deformity -Palpation: No tenderness to palpation -ROM (active): Abduction: 180 degrees; Forward Flexion: 180 degrees; Internal Rotation: T10 -Strength: Abduction: 5/5; Forward Flexion: 5/5; Internal Rotation: 5/5; External Rotation: 5/5 -Limb neurovascularly intact, no instability noted  Cervical Exam:  -Full range of motion with flexion, extension, lateral rotation.  -Spurlings: Negative   Limited diagnostic ultrasound of the right shoulder Findings: - Supraspinatus: Swelling of the subacromial bursa noted with possible partial-thickness articular surface tear at the attachment of the supraspinatus - AC joint: No acute abnormality noted - Infraspinatus: No acute tear or abnormality noted -Biceps tendon: Swelling noted within the biceps tendon sheath.  No tears of the biceps tendon noted -Subscapularis: Normal appearance of the subscapularis Impression: - Swelling of the bursa with possible partial-thickness tear of the distal  supraspinatus   Assessment & Plan:  Patient is a 68 y.o. male here for evaluation of right shoulder pain  1.  Right rotator cuff tendinitis with possible partial-thickness tear of the supraspinatus - Patient has failed conservative management for greater than 2 months with physical therapy and oral anti-inflammatories -We will proceed with getting an MRI of the right shoulder due to suspected tear of the right rotator cuff -Patient may continue to take Tylenol as needed for pain  Patient will follow-up after his MRI  Addendum:  I was the preceptor for this visit and available for immediate consultation.  Karlton Lemon MD Kirt Boys

## 2018-11-27 NOTE — Telephone Encounter (Signed)
Called and left voicemail regarding no show for 8 AM appointment this AM, informed of attendance policy due to 2nd no show. No further visits currently scheduled so requested call back to reschedule if needed.

## 2018-11-27 NOTE — Patient Instructions (Signed)
Your right shoulder pain is caused by tendinitis of the right rotator cuff with a possible tear of the rotator cuff - We will get an MRI to rule out rotator cuff tear -Your ultrasound today showed a possible small tear of the supraspinatus - You may continue to take Tylenol as needed for pain - We will have you follow-up after your MRI to discuss the results and neck steps for treatment

## 2018-11-30 ENCOUNTER — Other Ambulatory Visit: Payer: Self-pay | Admitting: *Deleted

## 2018-11-30 MED ORDER — DIAZEPAM 5 MG PO TABS: ORAL_TABLET | ORAL | 0 refills | Status: DC

## 2018-12-04 ENCOUNTER — Ambulatory Visit (INDEPENDENT_AMBULATORY_CARE_PROVIDER_SITE_OTHER): Payer: Medicare Other | Admitting: Cardiology

## 2018-12-04 ENCOUNTER — Encounter: Payer: Self-pay | Admitting: Cardiology

## 2018-12-04 ENCOUNTER — Other Ambulatory Visit: Payer: Self-pay

## 2018-12-04 VITALS — BP 123/83 | HR 80 | Temp 96.0°F | Ht 71.0 in | Wt 183.0 lb

## 2018-12-04 DIAGNOSIS — I10 Essential (primary) hypertension: Secondary | ICD-10-CM | POA: Diagnosis not present

## 2018-12-04 DIAGNOSIS — I2510 Atherosclerotic heart disease of native coronary artery without angina pectoris: Secondary | ICD-10-CM | POA: Diagnosis not present

## 2018-12-04 DIAGNOSIS — E782 Mixed hyperlipidemia: Secondary | ICD-10-CM | POA: Diagnosis not present

## 2018-12-04 NOTE — Progress Notes (Signed)
Follow up visit  Subjective:   Adam Burton, male    DOB: 15-Nov-1950, 68 y.o.   MRN: [id number]   Chief Complaint  Patient presents with  . Coronary Artery Disease  . Heartburn  . Follow-up     68 y/o African AmericanmaleCAD s/p prox-mid LAD 07/2017, hypertension, GERD, rotator cuff injury.  Retrosternal chest pain has improved after starting omeprazole. He denies any shortness of breath. His primary problem at this time is his right shoulder pain. He is undergoing [place]?MRI for further management.   Past Medical History:  Diagnosis Date  . Hypercholesteremia   . Hypertension   . Renal insufficiency 08/21/2012     Past Surgical History:  Procedure Laterality Date  . CARDIAC CATHETERIZATION  01/2009   Dr. Martinique. Normal coronary arteries. Normal LV function.  . CORONARY ANGIOGRAPHY N/A 08/04/2018   Procedure: CORONARY ANGIOGRAPHY (CATH LAB);  Surgeon: Nigel Mormon, MD;  Location: [place] [facility];  Service: Cardiovascular;  Laterality: N/A;  . CORONARY STENT INTERVENTION N/A 08/04/2018   Procedure: CORONARY STENT INTERVENTION;  Surgeon: Nigel Mormon, MD;  Location: [place] [facility];  Service: Cardiovascular;  Laterality: N/A;  . HERNIA REPAIR    . NM MYOVIEW LTD  12/2008   Potential small area of mild ischemia in the basal inferoseptal wall. Otherwise normal. --> False positive test based on coronary angiography  . Stress Echocardiogram  12/2013   Exercise for just over 4 minutes. Heart rate increased to 148 BPM. Hypertensive response to exercise. EF 60%. No evidence of ischemia. NORMAL STUDY  . TRANSTHORACIC ECHOCARDIOGRAM  11/2013   EF 55-60%. No regional wall motion abnormality is. Normal thickness. Mild LA dilation.     Social History   Socioeconomic History  . Marital status: Widowed    Spouse name: Not on file  . Number of children: 3  . Years of education: Not on file  . Highest education level: Not on file  Occupational History  . Not on  file  Social Needs  . Financial resource strain: Not on file  . Food insecurity    Worry: Not on file    Inability: Not on file  . Transportation needs    Medical: Not on file    Non-medical: Not on file  Tobacco Use  . Smoking status: Never Smoker  . Smokeless tobacco: Never Used  Substance and Sexual Activity  . Alcohol use: Yes    Comment: beer or vodka occasionally  . Drug use: Yes    Types: Marijuana    Comment: 3-6 X per week  . Sexual activity: Yes  Lifestyle  . Physical activity    Days per week: Not on file    Minutes per session: Not on file  . Stress: Not on file  Relationships  . Social Herbalist on phone: Not on file    Gets together: Not on file    Attends religious service: Not on file    Active member of club or organization: Not on file    Attends meetings of clubs or organizations: Not on file    Relationship status: Not on file  . Intimate partner violence    Fear of current or ex partner: Not on file    Emotionally abused: Not on file    Physically abused: Not on file    Forced sexual activity: Not on file  Other Topics Concern  . Not on file  Social History Narrative   He is  a widowed father of 42, grandfather of 3. He lives with his current girlfriend. 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     Family History  Problem Relation Age of Onset  . Heart attack Mother   . Diabetes Mother   . Diabetes Sister   . Diabetes Maternal Grandmother   . Heart attack Maternal Grandmother   . Diabetes Maternal Aunt   . Colon cancer Neg Hx   . Colon polyps Neg Hx   . Esophageal cancer Neg Hx   . Rectal cancer Neg Hx   . Stomach cancer Neg Hx      Current Outpatient Medications on File Prior to Visit  Medication Sig Dispense Refill  .  acetaminophen (TYLENOL) 500 MG tablet Take 2 tablets (1,000 mg total) by mouth every 8 (eight) hours as needed for moderate pain. 30 tablet 0  . aspirin 81 MG tablet Take 81 mg by mouth daily.     . carboxymethylcellulose (REFRESH TEARS) 0.5 % SOLN Place 1 drop into both eyes 3 (three) times daily as needed (irritation).    . clopidogrel (PLAVIX) 75 MG tablet Take 1 tablet (75 mg total) by mouth daily with breakfast. 90 tablet 2  . lisinopril-hydrochlorothiazide (ZESTORETIC) 20-25 MG tablet Take 1 tablet by mouth daily. 30 tablet 2  . metoprolol succinate (TOPROL-XL) 25 MG 24 hr tablet Take 1 tablet (25 mg total) by mouth daily. Take with or immediately following a meal. 30 tablet 2  . Multiple Vitamins-Minerals (LUTEIN-ZEAXANTHIN) TABS Take 1 tablet by mouth daily as needed (eye irritation).    . nitroGLYCERIN (NITROSTAT) 0.4 MG SL tablet Place 0.4 mg under the tongue every 5 (five) minutes as needed for chest pain.    [name] omeprazole (PRILOSEC) 20 MG capsule Take 1 capsule (20 mg total) by mouth daily. 90 capsule 3  . Zinc 50 MG TABS Take 50 mg by mouth daily.    . Misc Natural Products (PROSTATE SUPPORT PO) Take 1 tablet by mouth at bedtime.     No current facility-administered medications on file prior to visit.     Cardiovascular studies:  EKG 11/12/2018: Ectopic atrial rhythm 74 bpm, new since last EKG in 07/2018. Nonspecific T-abnormality.   Coronary angiography and intervention 08/04/2018: LM: Normal LAD: Prox-mid 75% stenosis. CTA FFR 0.75.         Diag 1 small vessel with 70% disease LCx: Normal RCA: Prox 60% stenosis. CTA FFR 0.85  Successful percutaneous coronary intervention prox-mid LAD PTCA and stent placement Resolute Onyx 3.5 X 22 mm drug-eluting stent Recommend medical management for small vessel diag disease, and RCA nonobstructive disease.  CTA 01/28/2018: EXAM: CT FFR ANALYSIS  CLINICAL DATA: 68 year old male with chest pain.  FINDINGS: FFRct analysis was  performed on the original cardiac CT angiogram dataset. Diagrammatic representation of the FFRct analysis is provided in a separate PDF document in PACS. This dictation was created using the PDF document and an interactive 3D model of the results. 3D model is not available in the EMR/PACS. Normal FFR range is >0.80.  1. Left Main: No significant stenosis. 2. LAD: Proximal: 0.95, mid: 0.75. 3. LCX: No significant stenosis. 4. RCA: Proximal: 0.98, mid: 0.85, distal: 0.81.  IMPRESSION: 1. CT FFR analysis showed severe stenosis in the proximal LAD. A  cardiac catheterization is recommended.  Exercise myoview stress 12/08/2017: 1. Resting EKG demonstrates normal sinus rhythm, LVH with nonspecific inferolateral T-wave abnormality.  Stress EKG is equivocal for ischemia with 2 mm upsloping ST segment depression noted in inferior and lateral leads that normalized to 2 minutes into recovery.  Stress symptoms included chest tightness. resting blood pressure 140/96 and peak blood pressure 134/94 mmHg.  Patient exercised for 6:54 minutes and achieved 8.43 Mets.  Stress terminated due to fatigue and 88% of MPHR achieved. 2.  The left ventricle was mildly dilated, both in rest and stress images at 124 mL. Stress and rest SPECT images demonstrate homogeneous tracer distribution throughout the myocardium. Gated SPECT imaging reveals global hypokinesis with EF calculated at 36%. 3. This is a high risk study, in view of abnormal EKG response, chest pain and reduced EF.  Echocardiogram 12/02/2013: - Left ventricle: Global LV strain is -19.4% The cavity size was  normal. Systolic function was normal. The estimated ejection  fraction was in the range of 55% to 60%. Wall motion was normal;  there were no regional wall motion abnormalities.  - Aortic valve: Trileaflet; normal thickness, mildly calcified  leaflets.  - Left atrium: The atrium was mildly dilated.   Recent labs: Results for ROHIT, CISNEY (MRN [id number]) as of 11/12/2018 09:25  Ref. Range 07/27/2018 09:20 07/27/2018 09:21  Sodium Latest Ref Range: 134 - 144 mmol/L 139   Potassium Latest Ref Range: 3.5 - 5.2 mmol/L 3.9   Chloride Latest Ref Range: 96 - 106 mmol/L 99   CO2 Latest Ref Range: 20 - 29 mmol/L 22   Glucose Latest Ref Range: 65 - 99 mg/dL 110 (H)   BUN Latest Ref Range: 8 - 27 mg/dL 11   Creatinine Latest Ref Range: 0.76 - 1.27 mg/dL 1.36 (H)   Calcium Latest Ref Range: 8.6 - 10.2 mg/dL 9.7   BUN/Creatinine Ratio Latest Ref Range: 10 - 24  8 (L)   GFR, Est Non African American Latest Ref Range: >59 mL/min/1.73 53 (L)   GFR, Est African American Latest Ref Range: >59 mL/min/1.73 61   Total CHOL/HDL Ratio Latest Ref Range: 0.0 - 5.0 ratio  2.5  Cholesterol, Total Latest Ref Range: 100 - 199 mg/dL  143  HDL Cholesterol Latest Ref Range: >39 mg/dL  58  LDL (calc) Latest Ref Range: 0 - 99 mg/dL  68  Triglycerides Latest Ref Range: 0 - 149 mg/dL  86  VLDL Cholesterol Cal Latest Ref Range: 5 - 40 mg/dL  17   Results for ZAYDIN, AUGHENBAUGH (MRN [id number]) as of 11/12/2018 09:25  Ref. Range 07/27/2018 09:19  WBC Latest Ref Range: 3.4 - 10.8 x10E3/uL 6.7  RBC Latest Ref Range: 4.14 - 5.80 x10E6/uL 4.89  Hemoglobin Latest Ref Range: 13.0 - 17.7 g/dL 14.8  HCT Latest Ref Range: 37.5 - 51.0 % 42.4  MCV Latest Ref Range: 79 - 97 fL 87  MCH Latest Ref Range: 26.6 - 33.0 pg 30.3  MCHC Latest Ref Range: 31.5 - 35.7 g/dL 34.9  RDW Latest Ref Range: 11.6 - 15.4 % 12.8  Platelets Latest Ref Range: 150 - 450 x10E3/uL 308    Review of Systems  Constitution: Negative for decreased appetite, malaise/fatigue, weight gain and weight loss.  HENT: Negative for congestion.   Eyes: Negative for visual disturbance.  Cardiovascular: Negative for chest pain, dyspnea on exertion, leg swelling, palpitations and syncope.  Respiratory: Negative for cough and shortness of breath (While talking).   Endocrine: Negative for cold  intolerance.   Hematologic/Lymphatic: Does not bruise/bleed easily.  Skin: Negative for itching and rash.  Musculoskeletal: Positive for joint pain (Right shoulder). Negative for myalgias.  Gastrointestinal: Positive for heartburn. Negative for abdominal pain, nausea and vomiting.  Genitourinary: Negative for dysuria.  Neurological: Negative for dizziness and weakness.  Psychiatric/Behavioral: The patient is not nervous/anxious.   All other systems reviewed and are negative.        Vitals:   12/04/18 1125  BP: 123/83  Pulse: 80  Temp: (!) 96 F (35.6 C)  SpO2: 98%     Body mass index is 25.52 kg/m. Filed Weights   12/04/18 1125  Weight: 183 lb (83 kg)     Objective:   Physical Exam  Constitutional: He is oriented to person, place, and time. He appears well-developed and well-nourished. No distress.  HENT:  Head: Normocephalic and atraumatic.  Eyes: Pupils are equal, round, and reactive to light. Conjunctivae are normal.  Neck: No JVD present.  Cardiovascular: Normal rate, regular rhythm and intact distal pulses.  No murmur heard. Pulmonary/Chest: Effort normal and breath sounds normal. He has no wheezes. He has no rales.  Abdominal: Soft. Bowel sounds are normal. There is no rebound.  Musculoskeletal:        General: Tenderness (Right shoulder) present. No edema.     Right shoulder: He exhibits decreased range of motion (Right shoulder).  Lymphadenopathy:    He has no cervical adenopathy.  Neurological: He is alert and oriented to person, place, and time. No cranial nerve deficit.  Skin: Skin is warm and dry.  Psychiatric: He has a normal mood and affect.  Nursing note and vitals reviewed.         Assessment & Recommendations:   69 y/o African AmericanmaleCAD s/p prox-mid LAD 07/2017, hypertension, GERD, rotator cuff injury.  CAD: Chest was improved with omeprazole and was likely due to GERD> No angina symptoms. Continue aspirin and Plavix to December 2020.   Thereafter, Plavix can be discontinued. Should he need surgery before that for his shoulder, reasonable to stop plavix sooner.  On Zetia due to statin intolerance.  Hypertension: Controlled.  Repeat lipid panel and f/u in 3 months.   Nigel Mormon, MD [facility] Pager: [phone] Office: [phone] If no answer Cell [phone]

## 2018-12-05 ENCOUNTER — Encounter: Payer: Self-pay | Admitting: Cardiology

## 2018-12-09 ENCOUNTER — Other Ambulatory Visit: Payer: Self-pay | Admitting: Cardiology

## 2018-12-09 DIAGNOSIS — I10 Essential (primary) hypertension: Secondary | ICD-10-CM

## 2018-12-13 ENCOUNTER — Other Ambulatory Visit: Payer: Self-pay

## 2018-12-13 ENCOUNTER — Ambulatory Visit
Admission: RE | Admit: 2018-12-13 | Discharge: 2018-12-13 | Disposition: A | Discharge status: Home / Self Care | Payer: Medicare Other | Source: Ambulatory Visit | Attending: Sports Medicine | Admitting: Sports Medicine

## 2018-12-13 DIAGNOSIS — M25511 Pain in right shoulder: Secondary | ICD-10-CM

## 2018-12-16 ENCOUNTER — Other Ambulatory Visit: Payer: Self-pay

## 2018-12-16 ENCOUNTER — Ambulatory Visit (INDEPENDENT_AMBULATORY_CARE_PROVIDER_SITE_OTHER): Payer: Medicare Other | Admitting: Sports Medicine

## 2018-12-16 ENCOUNTER — Encounter: Payer: Self-pay | Admitting: Sports Medicine

## 2018-12-16 VITALS — BP 124/78 | Ht 71.0 in | Wt 181.0 lb

## 2018-12-16 DIAGNOSIS — M7581 Other shoulder lesions, right shoulder: Secondary | ICD-10-CM | POA: Diagnosis not present

## 2018-12-16 NOTE — Progress Notes (Addendum)
PCP: [name], MD  Subjective:   HPI: Patient is a 68 y.o. male here for MRI follow-up of right shoulder.  Patient's pain is been present for the last several months.  He has been having limitations with range of motion that is progressively worsened.  He has undergone physical therapy which is minimally improved his symptoms.  He has not had any corticosteroid injections of his right shoulder.  Due to significant weakness and limitations in range of motion MRI was obtained.  MRI did not show any full-thickness tear of the supraspinatus however it did show evidence of significant tendinitis and some fraying of the supraspinatus on the articular surface.  There is also evidence of mild glenohumeral arthritis and a small joint effusion.  Patient notes his pain mostly occurs at night he is having minimal pain during the day right now.  The pain does not radiate, he has no bruising or swelling, he has no numbness or tingling.  He denies any injury or trauma that started the pain.  Pain has a sharp stabbing quality to it.   Review of Systems: See HPI above.  Past Medical History:  Diagnosis Date  . Hypercholesteremia   . Hypertension   . Renal insufficiency 08/21/2012    Current Outpatient Medications on File Prior to Visit  Medication Sig Dispense Refill  . acetaminophen (TYLENOL) 500 MG tablet Take 2 tablets (1,000 mg total) by mouth every 8 (eight) hours as needed for moderate pain. 30 tablet 0  . aspirin 81 MG tablet Take 81 mg by mouth daily.     . carboxymethylcellulose (REFRESH TEARS) 0.5 % SOLN Place 1 drop into both eyes 3 (three) times daily as needed (irritation).    . clopidogrel (PLAVIX) 75 MG tablet Take 1 tablet (75 mg total) by mouth daily with breakfast. 90 tablet 2  . lisinopril-hydrochlorothiazide (ZESTORETIC) 20-25 MG tablet Take 1 tablet by mouth once daily 90 tablet 1  . metoprolol succinate (TOPROL-XL) 25 MG 24 hr tablet Take 1 tablet (25 mg total) by mouth  daily. Take with or immediately following a meal. 30 tablet 2  . Multiple Vitamins-Minerals (LUTEIN-ZEAXANTHIN) TABS Take 1 tablet by mouth daily as needed (eye irritation).    . nitroGLYCERIN (NITROSTAT) 0.4 MG SL tablet Place 0.4 mg under the tongue every 5 (five) minutes as needed for chest pain.    [name] omeprazole (PRILOSEC) 20 MG capsule Take 1 capsule (20 mg total) by mouth daily. 90 capsule 3  . Zinc 50 MG TABS Take 50 mg by mouth daily.    . Misc Natural Products (PROSTATE SUPPORT PO) Take 1 tablet by mouth at bedtime.     No current facility-administered medications on file prior to visit.     Past Surgical History:  Procedure Laterality Date  . CARDIAC CATHETERIZATION  01/2009   Dr. Martinique. Normal coronary arteries. Normal LV function.  . CORONARY ANGIOGRAPHY N/A 08/04/2018   Procedure: CORONARY ANGIOGRAPHY (CATH LAB);  Surgeon: Nigel Mormon, MD;  Location: [place] [facility];  Service: Cardiovascular;  Laterality: N/A;  . CORONARY STENT INTERVENTION N/A 08/04/2018   Procedure: CORONARY STENT INTERVENTION;  Surgeon: Nigel Mormon, MD;  Location: [place] [facility];  Service: Cardiovascular;  Laterality: N/A;  . HERNIA REPAIR    . NM MYOVIEW LTD  12/2008   Potential small area of mild ischemia in the basal inferoseptal wall. Otherwise normal. --> False positive test based on coronary angiography  . Stress Echocardiogram  12/2013  Exercise for just over 4 minutes. Heart rate increased to 148 BPM. Hypertensive response to exercise. EF 60%. No evidence of ischemia. NORMAL STUDY  . TRANSTHORACIC ECHOCARDIOGRAM  11/2013   EF 55-60%. No regional wall motion abnormality is. Normal thickness. Mild LA dilation.    No Known Allergies  Social History   Socioeconomic History  . Marital status: Widowed    Spouse name: Not on file  . Number of children: 3  . Years of education: Not on file  . Highest education level: Not on file  Occupational History  . Not on file  Social  Needs  . Financial resource strain: Not on file  . Food insecurity    Worry: Not on file    Inability: Not on file  . Transportation needs    Medical: Not on file    Non-medical: Not on file  Tobacco Use  . Smoking status: Never Smoker  . Smokeless tobacco: Never Used  Substance and Sexual Activity  . Alcohol use: Yes    Comment: beer or vodka occasionally  . Drug use: Yes    Types: Marijuana    Comment: 3-6 X per week  . Sexual activity: Yes  Lifestyle  . Physical activity    Days per week: Not on file    Minutes per session: Not on file  . Stress: Not on file  Relationships  . Social Herbalist on phone: Not on file    Gets together: Not on file    Attends religious service: Not on file    Active member of club or organization: Not on file    Attends meetings of clubs or organizations: Not on file    Relationship status: Not on file  . Intimate partner violence    Fear of current or ex partner: Not on file    Emotionally abused: Not on file    Physically abused: Not on file    Forced sexual activity: Not on file  Other Topics Concern  . Not on file  Social History Narrative   He is a widowed father of 61, grandfather of 3. He lives with his current girlfriend. 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    Family History  Problem Relation Age of Onset  . Heart attack Mother   . Diabetes Mother   . Diabetes Sister   . Diabetes Maternal Grandmother   . Heart attack Maternal Grandmother   . Diabetes Maternal Aunt   . Colon cancer Neg Hx   . Colon polyps Neg Hx   . Esophageal cancer Neg Hx   . Rectal cancer Neg Hx   . Stomach cancer Neg Hx         Objective:  Physical Exam: BP 124/78   Ht 5\' 11"  (1.803 m)   Wt 181 lb (82.1 kg)   BMI 25.24 kg/m  Gen:  NAD, comfortable in exam room Lungs: Breathing comfortably on room air Shoulder Exam Left/right -Inspection: No discoloration, no deformity -Palpation: No tenderness to palpation -ROM (active and passive): Abduction: 90 degrees; Forward Flexion: 90 degrees; Internal Rotation: Unable to reach behind back -Strength: Abduction: 4/5; Forward Flexion: 4/5; Internal Rotation: 4/5; External Rotation: 4/5 -Special Tests: Hawkins: Positive; Neers: Positive; Jobs: Positive; -Limb neurovascularly intact, no instability  noted   Assessment & Plan:  Patient is a 68 y.o. male here for MRI follow-up of right shoulder  1.  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  He would like to return on Friday morning to have the injection done after he has had several days to think about it -Patient declined referral to formal physical therapy today but will be open to discussions about this in the future -Patient was given home exercises to work on  Patient will follow up on Friday morning for combined glenohumeral injection and subacromial injection of his right shoulder.  At that time I will readdress formal physical therapy with him  Addendum:  Patient seen in the office by fellow.  His history, exam, plan of care were precepted with me.  Karlton Lemon MD [facility]

## 2018-12-16 NOTE — Patient Instructions (Signed)
Your shoulder pain is caused by a severe tendinitis of your shoulder.  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

## 2018-12-18 ENCOUNTER — Encounter: Payer: Self-pay | Admitting: Sports Medicine

## 2018-12-18 ENCOUNTER — Other Ambulatory Visit: Payer: Self-pay

## 2018-12-18 ENCOUNTER — Ambulatory Visit (INDEPENDENT_AMBULATORY_CARE_PROVIDER_SITE_OTHER): Payer: Medicare Other | Admitting: Sports Medicine

## 2018-12-18 VITALS — BP 126/88

## 2018-12-18 DIAGNOSIS — M7581 Other shoulder lesions, right shoulder: Secondary | ICD-10-CM | POA: Diagnosis not present

## 2018-12-18 MED ORDER — METHYLPREDNISOLONE ACETATE 40 MG/ML IJ SUSP
40.0000 mg | Freq: Once | INTRAMUSCULAR | Status: AC
  Administered 2018-12-18: 40 mg via INTRA_ARTICULAR

## 2018-12-18 NOTE — Progress Notes (Signed)
PCP: [name], MD  Subjective:   HPI: Patient is a 68 y.o. male here for follow-up of right shoulder pain.  Patient was last seen 2 days ago to go over his MRI results.  At that time he was found to have glenohumeral arthritis as well as significant tendinitis of the right shoulder with some fraying of the rotator cuff.  Patient was offered a glenohumeral and subacromial injection at his last visit however he needed several days to mentally prepare himself for the injection.  Today he comes back to have the injection done.  He has been working on home exercises the last several days.  He notes his pain is not changed from his visit 2 days ago.  Please see note from 12/16/2018 for full details regarding patient's right shoulder pain.   Review of Systems: See HPI above.  Past Medical History:  Diagnosis Date  . Hypercholesteremia   . Hypertension   . Renal insufficiency 08/21/2012    Current Outpatient Medications on File Prior to Visit  Medication Sig Dispense Refill  . acetaminophen (TYLENOL) 500 MG tablet Take 2 tablets (1,000 mg total) by mouth every 8 (eight) hours as needed for moderate pain. 30 tablet 0  . aspirin 81 MG tablet Take 81 mg by mouth daily.     . carboxymethylcellulose (REFRESH TEARS) 0.5 % SOLN Place 1 drop into both eyes 3 (three) times daily as needed (irritation).    . clopidogrel (PLAVIX) 75 MG tablet Take 1 tablet (75 mg total) by mouth daily with breakfast. 90 tablet 2  . lisinopril-hydrochlorothiazide (ZESTORETIC) 20-25 MG tablet Take 1 tablet by mouth once daily 90 tablet 1  . metoprolol succinate (TOPROL-XL) 25 MG 24 hr tablet Take 1 tablet (25 mg total) by mouth daily. Take with or immediately following a meal. 30 tablet 2  . Multiple Vitamins-Minerals (LUTEIN-ZEAXANTHIN) TABS Take 1 tablet by mouth daily as needed (eye irritation).    . nitroGLYCERIN (NITROSTAT) 0.4 MG SL tablet Place 0.4 mg under the tongue every 5 (five) minutes as needed for  chest pain.    [name] omeprazole (PRILOSEC) 20 MG capsule Take 1 capsule (20 mg total) by mouth daily. 90 capsule 3  . Zinc 50 MG TABS Take 50 mg by mouth daily.    . Misc Natural Products (PROSTATE SUPPORT PO) Take 1 tablet by mouth at bedtime.     No current facility-administered medications on file prior to visit.     Past Surgical History:  Procedure Laterality Date  . CARDIAC CATHETERIZATION  01/2009   Dr. Martinique. Normal coronary arteries. Normal LV function.  . CORONARY ANGIOGRAPHY N/A 08/04/2018   Procedure: CORONARY ANGIOGRAPHY (CATH LAB);  Surgeon: Nigel Mormon, MD;  Location: [facility];  Service: Cardiovascular;  Laterality: N/A;  . CORONARY STENT INTERVENTION N/A 08/04/2018   Procedure: CORONARY STENT INTERVENTION;  Surgeon: Nigel Mormon, MD;  Location: [facility];  Service: Cardiovascular;  Laterality: N/A;  . HERNIA REPAIR    . NM MYOVIEW LTD  12/2008   Potential small area of mild ischemia in the basal inferoseptal wall. Otherwise normal. --> False positive test based on coronary angiography  . Stress Echocardiogram  12/2013   Exercise for just over 4 minutes. Heart rate increased to 148 BPM. Hypertensive response to exercise. EF 60%. No evidence of ischemia. NORMAL STUDY  . TRANSTHORACIC ECHOCARDIOGRAM  11/2013   EF 55-60%. No regional wall motion abnormality is. Normal thickness. Mild LA dilation.  No Known Allergies  Social History   Socioeconomic History  . Marital status: Widowed    Spouse name: Not on file  . Number of children: 3  . Years of education: Not on file  . Highest education level: Not on file  Occupational History  . Not on file  Social Needs  . Financial resource strain: Not on file  . Food insecurity    Worry: Not on file    Inability: Not on file  . Transportation needs    Medical: Not on file    Non-medical: Not on file  Tobacco Use  . Smoking status: Never Smoker  . Smokeless tobacco: Never Used  Substance  and Sexual Activity  . Alcohol use: Yes    Comment: beer or vodka occasionally  . Drug use: Yes    Types: Marijuana    Comment: 3-6 X per week  . Sexual activity: Yes  Lifestyle  . Physical activity    Days per week: Not on file    Minutes per session: Not on file  . Stress: Not on file  Relationships  . Social Herbalist on phone: Not on file    Gets together: Not on file    Attends religious service: Not on file    Active member of club or organization: Not on file    Attends meetings of clubs or organizations: Not on file    Relationship status: Not on file  . Intimate partner violence    Fear of current or ex partner: Not on file    Emotionally abused: Not on file    Physically abused: Not on file    Forced sexual activity: Not on file  Other Topics Concern  . Not on file  Social History Narrative   He is a widowed father of 59, grandfather of 3. He lives with his current girlfriend. 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    Family History  Problem Relation Age of Onset  . Heart attack Mother   . Diabetes Mother   . Diabetes Sister   . Diabetes Maternal Grandmother   . Heart attack Maternal Grandmother   . Diabetes Maternal Aunt   . Colon cancer Neg Hx   . Colon polyps Neg Hx   . Esophageal cancer Neg Hx   . Rectal cancer Neg Hx   . Stomach cancer Neg Hx         Objective:  Physical Exam: BP 126/88  Gen: NAD, comfortable in exam room Lungs: Breathing comfortably on room air Right Shoulder -No deformity, no overlying skin changes, no bruising   Assessment & Plan:  Patient is a 68 y.o. male here for right glenohumeral and subacromial injection  1.  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  Timeout was performed prior to the injection.  Patient was placed in a lateral position.  The skin was cleaned and prepped using alcohol.  3 cc of lidocaine was injected in the overlying skin for anesthetic.  40 mg of Depo-Medrol and 3 cc of lidocaine were injected using ultrasound guidance.  Half of the volume of the injectate was placed in his glenohumeral joint and the other half was placed  in the subacromial space.  Patient tolerated the injection well there no complications -Patient also elected to be referred to formal physical therapy.  He will likely go for 2-4 visits and then work on the exercises at home after that  Patient will follow-up in 4 to 6 weeks

## 2018-12-21 NOTE — Therapy (Signed)
[facility] [place], [place], [address] Phone: [phone]   Fax:  [phone]  Physical Therapy Treatment/Discharge  Patient Details  Name: Adam Burton MRN: [id number] Date of Birth: 12/17/1950 Referring Provider (PT): Martyn Malay, MD   Encounter Date: 11/17/2018    Past Medical History:  Diagnosis Date  . Hypercholesteremia   . Hypertension   . Renal insufficiency 08/21/2012    Past Surgical History:  Procedure Laterality Date  . CARDIAC CATHETERIZATION  01/2009   Dr. Martinique. Normal coronary arteries. Normal LV function.  . CORONARY ANGIOGRAPHY N/A 08/04/2018   Procedure: CORONARY ANGIOGRAPHY (CATH LAB);  Surgeon: Nigel Mormon, MD;  Location: [facility];  Service: Cardiovascular;  Laterality: N/A;  . CORONARY STENT INTERVENTION N/A 08/04/2018   Procedure: CORONARY STENT INTERVENTION;  Surgeon: Nigel Mormon, MD;  Location: [facility];  Service: Cardiovascular;  Laterality: N/A;  . HERNIA REPAIR    . NM MYOVIEW LTD  12/2008   Potential small area of mild ischemia in the basal inferoseptal wall. Otherwise normal. --> False positive test based on coronary angiography  . Stress Echocardiogram  12/2013   Exercise for just over 4 minutes. Heart rate increased to 148 BPM. Hypertensive response to exercise. EF 60%. No evidence of ischemia. NORMAL STUDY  . TRANSTHORACIC ECHOCARDIOGRAM  11/2013   EF 55-60%. No regional wall motion abnormality is. Normal thickness. Mild LA dilation.    There were no vitals filed for this visit.                                 PT Long Term Goals - 11/05/18 1209      PT LONG TERM GOAL #1   Title  Pt will be I and compliant with HEP. (Target goal for all goals 6 weeks 12/03/18)    Status  On-going      PT LONG TERM GOAL #2   Title  Pt will improve Rt shoulder ROM to [facility] to improve mobility    Status  On-going      PT  LONG TERM GOAL #3   Title  Pt will improve strength to at least 5/5 MMT to improve function    Status  On-going      PT LONG TERM GOAL #4   Title  Pt will improve overall activity tolerance and report pain less than 2-3/10 with ususal activity.    Status  On-going              Patient will benefit from skilled therapeutic intervention in order to improve the following deficits and impairments:  Decreased activity tolerance, Decreased range of motion, Decreased strength, Hypomobility, Pain  Visit Diagnosis: Acute pain of right shoulder  Stiffness of right shoulder, not elsewhere classified     Problem List Patient Active Problem List   Diagnosis Date Noted  . Shoulder impingement syndrome, right 10/11/2018  . Coronary artery disease 07/17/2018  . Alcohol use 04/24/2018  . Gastroesophageal reflux disease 10/02/2017  . Esophageal dysphagia 11/20/2016  . Corn of foot 09/19/2016  . Marijuana use 06/27/2016  . Prediabetes 10/10/2015  . Left shoulder pain 05/30/2015  . Discomfort in chest 01/26/2015  . Cataract, left eye 11/03/2014  . Gastritis 02/11/2014  . Impaired fasting glucose 10/19/2013  . Substance abuse ([place]) 09/04/2011  . Erectile dysfunction 09/04/2011  . Hemoptysis 03/29/2011  . Colon cancer screening 03/29/2011  .  Prostate cancer screening 03/29/2011  . DOE (dyspnea on exertion) 11/28/2009  . Hyperlipidemia 04/04/2009  . Benign prostatic hyperplasia 04/04/2009  . Essential hypertension 01/23/2009  . NOCTURIA 01/05/2009  . Essential hypertension, benign 01/05/2009       PHYSICAL THERAPY DISCHARGE SUMMARY  Visits from Start of Care: 7  Current functional level related to goals / functional outcomes: Patient did not return for further therapy after last session 11/17/18 with no show for last 2 scheduled visits. He was following up with MD for further assessment and treatment options.   Remaining deficits: Current status unknown/did not return    Education / Equipment: NA Plan: Patient agrees to discharge.  Patient goals were not met. Patient is being discharged due to not returning since the last visit.  ?????           Beaulah Dinning, PT, DPT 12/21/18 1:41 PM    [facility] [address] [place], [place], [address] Phone: [phone]   Fax:  [phone]  Name: Barret Esquivel MRN: [id number] Date of Birth: Dec 13, 1950

## 2019-02-10 ENCOUNTER — Other Ambulatory Visit: Payer: Medicare Other

## 2019-02-23 ENCOUNTER — Other Ambulatory Visit: Payer: Self-pay

## 2019-02-23 ENCOUNTER — Ambulatory Visit (INDEPENDENT_AMBULATORY_CARE_PROVIDER_SITE_OTHER): Payer: Medicare Other

## 2019-02-23 DIAGNOSIS — I2510 Atherosclerotic heart disease of native coronary artery without angina pectoris: Secondary | ICD-10-CM | POA: Diagnosis not present

## 2019-03-02 ENCOUNTER — Ambulatory Visit (INDEPENDENT_AMBULATORY_CARE_PROVIDER_SITE_OTHER): Payer: Medicare Other | Admitting: Cardiology

## 2019-03-02 ENCOUNTER — Encounter: Payer: Self-pay | Admitting: Cardiology

## 2019-03-02 ENCOUNTER — Other Ambulatory Visit: Payer: Self-pay

## 2019-03-02 VITALS — BP 139/82 | HR 79 | Temp 97.7°F | Resp 16 | Ht 71.0 in | Wt 186.6 lb

## 2019-03-02 DIAGNOSIS — I351 Nonrheumatic aortic (valve) insufficiency: Secondary | ICD-10-CM | POA: Diagnosis not present

## 2019-03-02 DIAGNOSIS — I2510 Atherosclerotic heart disease of native coronary artery without angina pectoris: Secondary | ICD-10-CM

## 2019-03-02 DIAGNOSIS — E782 Mixed hyperlipidemia: Secondary | ICD-10-CM | POA: Diagnosis not present

## 2019-03-02 NOTE — Progress Notes (Signed)
Follow up visit  Subjective:   Adam Burton, male    DOB: 03/27/1950, 69 y.o.   MRN: [id number]   Chief Complaint  Patient presents with  . Aortic Insuffiency     69 y/o African AmericanmaleCAD s/p prox-mid LAD 07/2017, hypertension, GERD, rotator cuff injury.  His primary complaint continues to be right shoulder pain. He has not seen sport medicine in a while. He has been using over the counter pain relief sprays (Non-NSAIDS). He denies chest pain, shortness of breath, palpitations, leg edema, orthopnea, PND, TIA/syncope.   Current Outpatient Medications on File Prior to Visit  Medication Sig Dispense Refill  . acetaminophen (TYLENOL) 500 MG tablet Take 2 tablets (1,000 mg total) by mouth every 8 (eight) hours as needed for moderate pain. 30 tablet 0  . aspirin EC 81 MG tablet Take 81 mg by mouth daily.    [name] lisinopril-hydrochlorothiazide (ZESTORETIC) 20-25 MG tablet Take 1 tablet by mouth once daily 90 tablet 1  . metoprolol succinate (TOPROL-XL) 25 MG 24 hr tablet Take 1 tablet (25 mg total) by mouth daily. Take with or immediately following a meal. 30 tablet 2  . omeprazole (PRILOSEC) 20 MG capsule Take 1 capsule (20 mg total) by mouth daily. 90 capsule 3  . rosuvastatin (CRESTOR) 20 MG tablet Take 20 mg by mouth daily.    . carboxymethylcellulose (REFRESH TEARS) 0.5 % SOLN Place 1 drop into both eyes 3 (three) times daily as needed (irritation).    . nitroGLYCERIN (NITROSTAT) 0.4 MG SL tablet Place 0.4 mg under the tongue every 5 (five) minutes as needed for chest pain.     No current facility-administered medications on file prior to visit.    Cardiovascular studies:  EKG 11/12/2018: Ectopic atrial rhythm 74 bpm, new since last EKG in 07/2018. Nonspecific T-abnormality.   Coronary angiography and intervention 08/04/2018: LM: Normal LAD: Prox-mid 75% stenosis. CTA FFR 0.75.         Diag 1 small vessel with 70% disease LCx: Normal RCA: Prox 60% stenosis. CTA FFR  0.85  Successful percutaneous coronary intervention prox-mid LAD PTCA and stent placement Resolute Onyx 3.5 X 22 mm drug-eluting stent Recommend medical management for small vessel diag disease, and RCA nonobstructive disease.  CTA 01/28/2018: EXAM: CT FFR ANALYSIS  CLINICAL DATA: 69 year old male with chest pain.  FINDINGS: FFRct analysis was performed on the original cardiac CT angiogram dataset. Diagrammatic representation of the FFRct analysis is provided in a separate PDF document in PACS. This dictation was created using the PDF document and an interactive 3D model of the results. 3D model is not available in the EMR/PACS. Normal FFR range is >0.80.  1. Left Main: No significant stenosis. 2. LAD: Proximal: 0.95, mid: 0.75. 3. LCX: No significant stenosis. 4. RCA: Proximal: 0.98, mid: 0.85, distal: 0.81.  IMPRESSION: 1. CT FFR analysis showed severe stenosis in the proximal LAD. A cardiac catheterization is recommended.  Exercise myoview stress 12/08/2017: 1. Resting EKG demonstrates normal sinus rhythm, LVH with nonspecific inferolateral T-wave abnormality.  Stress EKG is equivocal for ischemia with 2 mm upsloping ST segment depression noted in inferior and lateral leads that normalized to 2 minutes into recovery.  Stress symptoms included chest tightness. resting blood pressure 140/96 and peak blood pressure 134/94 mmHg.  Patient exercised for 6:54 minutes and achieved 8.43 Mets.  Stress terminated due to fatigue and 88% of MPHR achieved. 2.  The left ventricle was mildly dilated, both in rest and stress images at 124 mL. Stress and  rest SPECT images demonstrate homogeneous tracer distribution throughout the myocardium. Gated SPECT imaging reveals global hypokinesis with EF calculated at 36%. 3. This is a high risk study, in view of abnormal EKG response, chest pain and reduced EF.  Echocardiogram 12/02/2013: - Left ventricle: Global LV strain is -19.4% The cavity size  was  normal. Systolic function was normal. The estimated ejection  fraction was in the range of 55% to 60%. Wall motion was normal;  there were no regional wall motion abnormalities.  - Aortic valve: Trileaflet; normal thickness, mildly calcified  leaflets.  - Left atrium: The atrium was mildly dilated.   Recent labs: 07/27/2018: Glucose 110, BUN/Cr 11/1.36. EGFR 61. Na/K 139/3.9.  H/H 14/42. MCV 87. Platelets 308 HbA1C 6.2% Chol 143, TG 86, HDL 58, LDL 68  Review of Systems  Constitution: Negative for decreased appetite, malaise/fatigue, weight gain and weight loss.  HENT: Negative for congestion.   Eyes: Negative for visual disturbance.  Cardiovascular: Negative for chest pain, dyspnea on exertion, leg swelling, palpitations and syncope.  Respiratory: Negative for cough and shortness of breath (While talking).   Endocrine: Negative for cold intolerance.  Hematologic/Lymphatic: Does not bruise/bleed easily.  Skin: Negative for itching and rash.  Musculoskeletal: Positive for joint pain (Right shoulder). Negative for myalgias.  Gastrointestinal: Positive for heartburn. Negative for abdominal pain, nausea and vomiting.  Genitourinary: Negative for dysuria.  Neurological: Negative for dizziness and weakness.  Psychiatric/Behavioral: The patient is not nervous/anxious.   All other systems reviewed and are negative.      Vitals:   03/02/19 1608  BP: 139/82  Pulse: 79  Resp: 16  Temp: 97.7 F (36.5 C)  SpO2: 94%     Body mass index is 26.03 kg/m. Filed Weights   03/02/19 1608  Weight: 186 lb 9.6 oz (84.6 kg)     Objective:   Physical Exam  Constitutional: He is oriented to person, place, and time. He appears well-developed and well-nourished. No distress.  HENT:  Head: Normocephalic and atraumatic.  Eyes: Pupils are equal, round, and reactive to light. Conjunctivae are normal.  Neck: No JVD present.  Cardiovascular: Normal rate, regular rhythm and intact  distal pulses.  No murmur heard. Pulmonary/Chest: Effort normal and breath sounds normal. He has no wheezes. He has no rales.  Abdominal: Soft. Bowel sounds are normal. There is no rebound.  Musculoskeletal:        General: Tenderness (Right shoulder) present. No edema.     Right shoulder: Decreased range of motion (Right shoulder).  Lymphadenopathy:    He has no cervical adenopathy.  Neurological: He is alert and oriented to person, place, and time. No cranial nerve deficit.  Skin: Skin is warm and dry.  Psychiatric: He has a normal mood and affect.  Nursing note and vitals reviewed.         Assessment & Recommendations:   69 y/o African AmericanmaleCAD s/p prox-mid LAD 07/2017, hypertension, GERD, rotator cuff injury.  CAD: Chest was improved with omeprazole and was likely due to GERD. No angina symptoms. Continue aspirin, now on rosuvastatin. Check lipid panel  Hypertension: Controlled.  Aortic regurgitation: Moderate, asymptomatic. Repeat echocardiogram in 1 year.   F/u in 1 year after repeat echocardiogram.  Nigel Mormon, MD [facility] [place] Pager: [phone] Office: [phone] If no answer Cell [phone]

## 2019-03-03 ENCOUNTER — Other Ambulatory Visit: Payer: Self-pay | Admitting: Sports Medicine

## 2019-03-03 ENCOUNTER — Ambulatory Visit: Payer: Medicare Other | Admitting: Cardiology

## 2019-03-03 ENCOUNTER — Ambulatory Visit (INDEPENDENT_AMBULATORY_CARE_PROVIDER_SITE_OTHER): Payer: Medicare Other | Admitting: Sports Medicine

## 2019-03-03 ENCOUNTER — Encounter: Payer: Self-pay | Admitting: Sports Medicine

## 2019-03-03 ENCOUNTER — Other Ambulatory Visit: Payer: Self-pay

## 2019-03-03 VITALS — BP 124/78 | Ht 71.0 in | Wt 181.0 lb

## 2019-03-03 DIAGNOSIS — M7581 Other shoulder lesions, right shoulder: Secondary | ICD-10-CM

## 2019-03-03 DIAGNOSIS — M7501 Adhesive capsulitis of right shoulder: Secondary | ICD-10-CM | POA: Diagnosis not present

## 2019-03-03 MED ORDER — NITROGLYCERIN 0.2 MG/HR TD PT24: MEDICATED_PATCH | TRANSDERMAL | 1 refills | Status: DC

## 2019-03-03 MED ORDER — MELOXICAM 15 MG PO TABS: ORAL_TABLET | ORAL | 0 refills | Status: DC

## 2019-03-03 NOTE — Patient Instructions (Signed)
The pain in your shoulder is caused by tendinitis of your rotator cuff as well as adhesive capsulitis.  This is also known as frozen shoulder. -Continue to work on the range of motion exercises shown to you at physical therapy -I have sent in a prescription for meloxicam.  This is a strong anti-inflammatory.  Do not take other anti-inflammatories such as Aleve or Motrin.  You may continue to take Tylenol if you would like -I have also sent in a prescription for nitroglycerin patches.  Please see the information below regarding these patches  I will see back in 4 weeks.  Nitroglycerin Protocol   Apply 1/4 nitroglycerin patch to affected area daily.  Change position of patch within the affected area every 24 hours.  You may experience a headache during the first 1-2 weeks of using the patch, these should subside.  If you experience headaches after beginning nitroglycerin patch treatment, you may take your preferred over the counter pain reliever.  Another side effect of the nitroglycerin patch is skin irritation or rash related to patch adhesive.  Please notify our office if you develop more severe headaches or rash, and stop the patch.  Tendon healing with nitroglycerin patch may require 12 to 24 weeks depending on the extent of injury.  Men should not use if taking Viagra, Cialis, or Levitra.   Do not use if you have migraines or rosacea.

## 2019-03-03 NOTE — Progress Notes (Addendum)
PCP: [name], MD  Subjective:   HPI: Patient is a 69 y.o. male here for follow-up of right shoulder pain.  Patient's pain is been present for the last 3 months now.  He has had an MRI showing tendinitis of his rotator cuff as well as findings consistent with adhesive capsulitis.  Patient has gone through formal physical therapy and is now working on exercises at home.  He is also had a subacromial corticosteroid injection.  Patient notes his pain is improved slightly since his last visit however he still has severe limitations in range of motion is only able to get his arm to about 90 degrees of forward flexion abduction.  Patient also notes significant nighttime pain.  He has no radiation of pain down his arm.  He has no associated numbness or tingling.   Review of Systems: See HPI above.  Past Medical History:  Diagnosis Date  . Hypercholesteremia   . Hypertension   . Renal insufficiency 08/21/2012    Current Outpatient Medications on File Prior to Visit  Medication Sig Dispense Refill  . acetaminophen (TYLENOL) 500 MG tablet Take 2 tablets (1,000 mg total) by mouth every 8 (eight) hours as needed for moderate pain. 30 tablet 0  . aspirin EC 81 MG tablet Take 81 mg by mouth daily.    . carboxymethylcellulose (REFRESH TEARS) 0.5 % SOLN Place 1 drop into both eyes 3 (three) times daily as needed (irritation).    [name] lisinopril-hydrochlorothiazide (ZESTORETIC) 20-25 MG tablet Take 1 tablet by mouth once daily 90 tablet 1  . metoprolol succinate (TOPROL-XL) 25 MG 24 hr tablet Take 1 tablet (25 mg total) by mouth daily. Take with or immediately following a meal. 30 tablet 2  . nitroGLYCERIN (NITROSTAT) 0.4 MG SL tablet Place 0.4 mg under the tongue every 5 (five) minutes as needed for chest pain.    [name] omeprazole (PRILOSEC) 20 MG capsule Take 1 capsule (20 mg total) by mouth daily. 90 capsule 3  . rosuvastatin (CRESTOR) 20 MG tablet Take 20 mg by mouth daily.     No current  facility-administered medications on file prior to visit.    Past Surgical History:  Procedure Laterality Date  . CARDIAC CATHETERIZATION  01/2009   Dr. Martinique. Normal coronary arteries. Normal LV function.  . CORONARY ANGIOGRAPHY N/A 08/04/2018   Procedure: CORONARY ANGIOGRAPHY (CATH LAB);  Surgeon: Nigel Mormon, MD;  Location: [facility];  Service: Cardiovascular;  Laterality: N/A;  . CORONARY STENT INTERVENTION N/A 08/04/2018   Procedure: CORONARY STENT INTERVENTION;  Surgeon: Nigel Mormon, MD;  Location: [facility];  Service: Cardiovascular;  Laterality: N/A;  . HERNIA REPAIR    . NM MYOVIEW LTD  12/2008   Potential small area of mild ischemia in the basal inferoseptal wall. Otherwise normal. --> False positive test based on coronary angiography  . Stress Echocardiogram  12/2013   Exercise for just over 4 minutes. Heart rate increased to 148 BPM. Hypertensive response to exercise. EF 60%. No evidence of ischemia. NORMAL STUDY  . TRANSTHORACIC ECHOCARDIOGRAM  11/2013   EF 55-60%. No regional wall motion abnormality is. Normal thickness. Mild LA dilation.    No Known Allergies  Social History   Socioeconomic History  . Marital status: Widowed    Spouse name: Not on file  . Number of children: 3  . Years of education: Not on file  . Highest education level: Not on file  Occupational History  . Not on  file  Tobacco Use  . Smoking status: Never Smoker  . Smokeless tobacco: Never Used  Substance and Sexual Activity  . Alcohol use: Yes    Comment: beer or vodka occasionally  . Drug use: Yes    Types: Marijuana    Comment: 3-6 X per week  . Sexual activity: Yes  Other Topics Concern  . Not on file  Social History Narrative   He is a widowed father of 72, grandfather of 3. He lives with his current girlfriend. 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 He does a lot of walking with grounds keeping  in tries to do routine walking 5 days a week, but has noted that he hasn't been doing as much lately.   Social Determinants of Health   Financial Resource Strain:   . Difficulty of Paying Living Expenses: Not on file  Food Insecurity:   . Worried About Charity fundraiser in the Last Year: Not on file  . Ran Out of Food in the Last Year: Not on file  Transportation Needs:   . Lack of Transportation (Medical): Not on file  . Lack of Transportation (Non-Medical): Not on file  Physical Activity:   . Days of Exercise per Week: Not on file  . Minutes of Exercise per Session: Not on file  Stress:   . Feeling of Stress : Not on file  Social Connections:   . Frequency of Communication with Friends and Family: Not on file  . Frequency of Social Gatherings with Friends and Family: Not on file  . Attends Religious Services: Not on file  . Active Member of Clubs or Organizations: Not on file  . Attends Archivist Meetings: Not on file  . Marital Status: Not on file  Intimate Partner Violence:   . Fear of Current or Ex-Partner: Not on file  . Emotionally Abused: Not on file  . Physically Abused: Not on file  . Sexually Abused: Not on file    Family History  Problem Relation Age of Onset  . Heart attack Mother   . Diabetes Mother   . Diabetes Sister   . Diabetes Maternal Grandmother   . Heart attack Maternal Grandmother   . Diabetes Maternal Aunt   . Colon cancer Neg Hx   . Colon polyps Neg Hx   . Esophageal cancer Neg Hx   . Rectal cancer Neg Hx   . Stomach cancer Neg Hx         Objective:  Physical Exam: There were no vitals taken for this visit. Gen: NAD, comfortable in exam room Lungs: Breathing comfortably on room air Shoulder Exam Left/right -Inspection: No discoloration, no deformity -Palpation: No tenderness to palpation -ROM (active): Abduction: 90 degrees; Forward Flexion: 90 degrees; Internal Rotation: T10. -Patient able to forward flex and abduct to  110 degrees with passive range of motion -Strength: Abduction: 4/5; Forward Flexion: 4/5; Internal Rotation: 5/5; External Rotation: 5/5 -Special Tests: Hawkins: Positive; Neers: Positive; Jobs: Positive; O'briens: Negative; Speeds: Negative; Yergasons: Negative -Limb neurovascularly intact, no instability noted    Assessment & Plan:  Patient is a 69 y.o. male here for follow-up of right shoulder pain  1.  Right rotator cuff tendinitis adhesive capsulitis -MRI previously showing no evidence of full-thickness tear however he does have evidence of tendinitis adhesive capsulitis -Patient will continue to work on range of motion exercises at home -Prescription has been sent for meloxicam.  Patient was advised  to not take other NSAIDs along with this however he may take Tylenol -Prescription was sent for nitroglycerin patches  Patient will follow up in 4 weeks.  If he is not having improvement at that time we will consider referral to orthopedic surgery for discussion regarding surgical management of his frozen shoulder  Addendum:  Patient seen in the office by fellow.  His history, exam, plan of care were precepted with me.  Karlton Lemon MD [facility] Boys

## 2019-03-05 ENCOUNTER — Ambulatory Visit: Payer: Medicare Other | Admitting: Cardiology

## 2019-03-31 ENCOUNTER — Encounter: Payer: Self-pay | Admitting: Sports Medicine

## 2019-03-31 ENCOUNTER — Other Ambulatory Visit: Payer: Self-pay

## 2019-03-31 ENCOUNTER — Ambulatory Visit (INDEPENDENT_AMBULATORY_CARE_PROVIDER_SITE_OTHER): Payer: Medicare Other | Admitting: Sports Medicine

## 2019-03-31 VITALS — BP 132/84 | Ht 71.0 in | Wt 181.0 lb

## 2019-03-31 DIAGNOSIS — M7501 Adhesive capsulitis of right shoulder: Secondary | ICD-10-CM

## 2019-03-31 NOTE — Progress Notes (Addendum)
PCP: [name], MD  Subjective:   HPI: Patient is a 69 y.o. male here for follow-up of right shoulder pain.  Patient has been having right shoulder pain for the last several months now.  He has had an MRI of his right shoulder showing evidence of adhesive capsulitis as well as evidence of tendinitis of his rotator cuff but no full-thickness tear.  Patient has undergone formal physical therapy as well as home physical therapy.  He is also had corticosteroid injection to his right shoulder.  He notes that he is still having ongoing pain and limitations with range of motion in his shoulder.  His pain is located primarily in the lateral aspect of her shoulder and radiates down to his elbow.  Patient endorses frustration due to the ongoing pain and is wondering if there are other treatment options for him.   Review of Systems: See HPI above.  Past Medical History:  Diagnosis Date  . Hypercholesteremia   . Hypertension   . Renal insufficiency 08/21/2012    Current Outpatient Medications on File Prior to Visit  Medication Sig Dispense Refill  . acetaminophen (TYLENOL) 500 MG tablet Take 2 tablets (1,000 mg total) by mouth every 8 (eight) hours as needed for moderate pain. 30 tablet 0  . aspirin EC 81 MG tablet Take 81 mg by mouth daily.    . carboxymethylcellulose (REFRESH TEARS) 0.5 % SOLN Place 1 drop into both eyes 3 (three) times daily as needed (irritation).    [name] lisinopril-hydrochlorothiazide (ZESTORETIC) 20-25 MG tablet Take 1 tablet by mouth once daily 90 tablet 1  . meloxicam (MOBIC) 15 MG tablet Take 1 tablet daily with food for 7 days. Then take as needed. 40 tablet 0  . nitroGLYCERIN (NITRODUR - DOSED IN MG/24 HR) 0.2 mg/hr patch Use 1/4 patch daily to the affected area. 30 patch 1  . omeprazole (PRILOSEC) 20 MG capsule Take 1 capsule (20 mg total) by mouth daily. 90 capsule 3  . rosuvastatin (CRESTOR) 20 MG tablet Take 20 mg by mouth daily.    . metoprolol succinate  (TOPROL-XL) 25 MG 24 hr tablet Take 1 tablet (25 mg total) by mouth daily. Take with or immediately following a meal. 30 tablet 2  . nitroGLYCERIN (NITROSTAT) 0.4 MG SL tablet Place 0.4 mg under the tongue every 5 (five) minutes as needed for chest pain.     No current facility-administered medications on file prior to visit.    Past Surgical History:  Procedure Laterality Date  . CARDIAC CATHETERIZATION  01/2009   Dr. Martinique. Normal coronary arteries. Normal LV function.  . CORONARY ANGIOGRAPHY N/A 08/04/2018   Procedure: CORONARY ANGIOGRAPHY (CATH LAB);  Surgeon: Nigel Mormon, MD;  Location: [facility];  Service: Cardiovascular;  Laterality: N/A;  . CORONARY STENT INTERVENTION N/A 08/04/2018   Procedure: CORONARY STENT INTERVENTION;  Surgeon: Nigel Mormon, MD;  Location: [facility];  Service: Cardiovascular;  Laterality: N/A;  . HERNIA REPAIR    . NM MYOVIEW LTD  12/2008   Potential small area of mild ischemia in the basal inferoseptal wall. Otherwise normal. --> False positive test based on coronary angiography  . Stress Echocardiogram  12/2013   Exercise for just over 4 minutes. Heart rate increased to 148 BPM. Hypertensive response to exercise. EF 60%. No evidence of ischemia. NORMAL STUDY  . TRANSTHORACIC ECHOCARDIOGRAM  11/2013   EF 55-60%. No regional wall motion abnormality is. Normal thickness. Mild LA dilation.  No Known Allergies  Social History   Socioeconomic History  . Marital status: Widowed    Spouse name: Not on file  . Number of children: 3  . Years of education: Not on file  . Highest education level: Not on file  Occupational History  . Not on file  Tobacco Use  . Smoking status: Never Smoker  . Smokeless tobacco: Never Used  Substance and Sexual Activity  . Alcohol use: Yes    Comment: beer or vodka occasionally  . Drug use: Yes    Types: Marijuana    Comment: 3-6 X per week  . Sexual activity: Yes  Other Topics Concern   . Not on file  Social History Narrative   He is a widowed father of 33, grandfather of 3. He lives with his current girlfriend. 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   Social Determinants of Health   Financial Resource Strain:   . Difficulty of Paying Living Expenses: Not on file  Food Insecurity:   . Worried About Charity fundraiser in the Last Year: Not on file  . Ran Out of Food in the Last Year: Not on file  Transportation Needs:   . Lack of Transportation (Medical): Not on file  . Lack of Transportation (Non-Medical): Not on file  Physical Activity:   . Days of Exercise per Week: Not on file  . Minutes of Exercise per Session: Not on file  Stress:   . Feeling of Stress : Not on file  Social Connections:   . Frequency of Communication with Friends and Family: Not on file  . Frequency of Social Gatherings with Friends and Family: Not on file  . Attends Religious Services: Not on file  . Active Member of Clubs or Organizations: Not on file  . Attends Archivist Meetings: Not on file  . Marital Status: Not on file  Intimate Partner Violence:   . Fear of Current or Ex-Partner: Not on file  . Emotionally Abused: Not on file  . Physically Abused: Not on file  . Sexually Abused: Not on file    Family History  Problem Relation Age of Onset  . Heart attack Mother   . Diabetes Mother   . Diabetes Sister   . Diabetes Maternal Grandmother   . Heart attack Maternal Grandmother   . Diabetes Maternal Aunt   . Colon cancer Neg Hx   . Colon polyps Neg Hx   . Esophageal cancer Neg Hx   . Rectal cancer Neg Hx   . Stomach cancer Neg Hx         Objective:  Physical Exam: BP 132/84   Ht 5\' 11"  (1.803 m)   Wt 181 lb (82.1 kg)   BMI 25.24 kg/m  Gen: NAD, comfortable  in exam room Lungs: Breathing comfortably on room air Shoulder Exam Left/right -Inspection: No discoloration, no deformity -Palpation: No tenderness to palpation -ROM (active): Abduction: 100 degrees; Forward Flexion: 100 degrees; Internal Rotation: T10 -Strength: Abduction: 4/5; Forward Flexion: 4/5; Internal Rotation: 5/5; External Rotation: 5/5 -Special Tests: Hawkins: Positive; Neers: Positive; Jobs: Positive; O'briens: Negative; Speeds: Negative; Yergasons: Negative   Assessment & Plan:  Patient is a 69 y.o. male here for follow-up of right shoulder pain  1.  Adhesive  capsulitis with rotator cuff tendinitis -Patient was given several different treatment options including continuing home physical therapy versus repeating combined glenohumeral and subacromial injection versus referral to orthopedic surgery to see if there are any surgical treatment options for him. -Patient declined repeat corticosteroid injection at this time and would like to discuss his shoulder pain with an orthopedic surgeon to see if there are any other treatment options for him at this time.  An appointment was made with Dr. Mardelle Matte this Friday. -Patient will continue nitroglycerin patches for the time being as well as oral anti-inflammatories -Patient will continue working on home exercises  Patient will follow up on an as-needed basis.  If he decides to get a repeat corticosteroid injection he may follow-up here for that  Addendum:  I was the preceptor for this visit and available for immediate consultation.  Karlton Lemon MD [facility] Boys

## 2019-03-31 NOTE — Patient Instructions (Addendum)
Your shoulder pain is caused by adhesive capsulitis.  Is also known as frozen shoulder.  Your MRI previously showed evidence of a frozen shoulder as well as inflammation of your rotator cuff but no large tear was seen -We will send you to see orthopedic surgeon for second opinion to see if surgery would help your shoulder pain Dr Domingo Sep [facility] [address] [place] Friday 04/02/19 at 315p [phone] -If you decide against surgery at that time we can try a series of shoulder injections into your shoulder joint to help the pain -Continue to work on the home exercises shown to you at the last visit -You may continue to use the nitroglycerin patches if these are helping

## 2019-04-01 ENCOUNTER — Other Ambulatory Visit: Payer: Self-pay

## 2019-04-01 ENCOUNTER — Emergency Department (HOSPITAL_COMMUNITY): Payer: Medicare Other

## 2019-04-01 ENCOUNTER — Emergency Department (HOSPITAL_COMMUNITY)
Admission: EM | Admit: 2019-04-01 | Discharge: 2019-04-01 | Disposition: A | Discharge status: Home / Self Care | Payer: Medicare Other | Attending: Emergency Medicine | Admitting: Emergency Medicine

## 2019-04-01 ENCOUNTER — Telehealth: Payer: Self-pay | Admitting: Cardiology

## 2019-04-01 ENCOUNTER — Other Ambulatory Visit: Payer: Self-pay | Admitting: Cardiology

## 2019-04-01 ENCOUNTER — Encounter (HOSPITAL_COMMUNITY): Payer: Self-pay | Admitting: Emergency Medicine

## 2019-04-01 ENCOUNTER — Telehealth: Payer: Self-pay

## 2019-04-01 DIAGNOSIS — Z7982 Long term (current) use of aspirin: Secondary | ICD-10-CM | POA: Diagnosis not present

## 2019-04-01 DIAGNOSIS — F1210 Cannabis abuse, uncomplicated: Secondary | ICD-10-CM | POA: Diagnosis not present

## 2019-04-01 DIAGNOSIS — I10 Essential (primary) hypertension: Secondary | ICD-10-CM | POA: Insufficient documentation

## 2019-04-01 DIAGNOSIS — Z20822 Contact with and (suspected) exposure to covid-19: Secondary | ICD-10-CM | POA: Insufficient documentation

## 2019-04-01 DIAGNOSIS — R072 Precordial pain: Secondary | ICD-10-CM | POA: Diagnosis not present

## 2019-04-01 DIAGNOSIS — Z79899 Other long term (current) drug therapy: Secondary | ICD-10-CM | POA: Diagnosis not present

## 2019-04-01 DIAGNOSIS — R0789 Other chest pain: Secondary | ICD-10-CM | POA: Insufficient documentation

## 2019-04-01 DIAGNOSIS — R079 Chest pain, unspecified: Secondary | ICD-10-CM

## 2019-04-01 DIAGNOSIS — I2510 Atherosclerotic heart disease of native coronary artery without angina pectoris: Secondary | ICD-10-CM

## 2019-04-01 LAB — RESPIRATORY PANEL BY RT PCR (FLU A&B, COVID)
Influenza A by PCR: NEGATIVE
Influenza B by PCR: NEGATIVE
SARS Coronavirus 2 by RT PCR: NEGATIVE

## 2019-04-01 LAB — CBC
HCT: 44.6 % (ref 39.0–52.0)
Hemoglobin: 14.5 g/dL (ref 13.0–17.0)
MCH: 30.9 pg (ref 26.0–34.0)
MCHC: 32.5 g/dL (ref 30.0–36.0)
MCV: 94.9 fL (ref 80.0–100.0)
Platelets: 347 10*3/uL (ref 150–400)
RBC: 4.7 MIL/uL (ref 4.22–5.81)
RDW: 13.6 % (ref 11.5–15.5)
WBC: 5.9 10*3/uL (ref 4.0–10.5)
nRBC: 0 % (ref 0.0–0.2)

## 2019-04-01 LAB — TROPONIN I (HIGH SENSITIVITY)
Troponin I (High Sensitivity): <2 ng/L (ref <18)
Troponin I (High Sensitivity): <2 ng/L (ref <18)

## 2019-04-01 LAB — HEPATIC FUNCTION PANEL
ALT: 22 U/L (ref 0–44)
AST: 21 U/L (ref 15–41)
Albumin: 4.4 g/dL (ref 3.5–5.0)
Alkaline Phosphatase: 53 U/L (ref 38–126)
Bilirubin, Direct: 0.2 mg/dL (ref 0.0–0.2)
Indirect Bilirubin: 0.5 mg/dL (ref 0.3–0.9)
Total Bilirubin: 0.7 mg/dL (ref 0.3–1.2)
Total Protein: 7.5 g/dL (ref 6.5–8.1)

## 2019-04-01 LAB — BASIC METABOLIC PANEL
Anion gap: 13 (ref 5–15)
BUN: 22 mg/dL (ref 8–23)
CO2: 25 mmol/L (ref 22–32)
Calcium: 9.4 mg/dL (ref 8.9–10.3)
Chloride: 101 mmol/L (ref 98–111)
Creatinine, Ser: 1.41 mg/dL — ABNORMAL HIGH (ref 0.61–1.24)
GFR calc Af Amer: 58 mL/min — ABNORMAL LOW (ref >60)
GFR calc non Af Amer: 50 mL/min — ABNORMAL LOW (ref >60)
Glucose, Bld: 159 mg/dL — ABNORMAL HIGH (ref 70–99)
Potassium: 3.4 mmol/L — ABNORMAL LOW (ref 3.5–5.1)
Sodium: 139 mmol/L (ref 135–145)

## 2019-04-01 MED ORDER — SODIUM CHLORIDE 0.9% FLUSH: 3.0000 mL | Freq: Once | INTRAVENOUS | Status: DC

## 2019-04-01 MED ORDER — HYDRALAZINE HCL 20 MG/ML IJ SOLN
10.0000 mg | Freq: Once | INTRAMUSCULAR | Status: AC
  Administered 2019-04-01: 10 mg via INTRAVENOUS
  Filled 2019-04-01: qty 1

## 2019-04-01 NOTE — Telephone Encounter (Signed)
Will follow up after ED evaluation

## 2019-04-01 NOTE — ED Notes (Signed)
Pt d/c home per MD order. Discharge summary reviewed, pt verbalizes understanding. No s/s of acute distress noted- ambulatory off unit.

## 2019-04-01 NOTE — Discharge Instructions (Signed)
Your work-up today was overall reassuring.  The cardiac enzyme tests, troponin, were both negative.  Your chest x-ray did not show pneumonia.  Your vital signs improved after the blood pressure medicine and you had no further chest pain here.  Based on cardiology recommendations, we will send you home to follow-up with outpatient cardiology.  If any symptoms change or worsen, please return to nearest emergency department.

## 2019-04-01 NOTE — Progress Notes (Addendum)
69 y/o African AmericanmaleCAD s/p prox-mid LAD 07/2018 for stable angina, hypertension, GERD, rotator cuff injury, chest pain, shortness of breath.  Episode of chest pain, shortness breath, and diaphoresis occurred this morning after eating breakfast.  Pain has completely resolved on presentation to the ED.  He is hypertensive.  EKG shows sinus rhythm, anterolateral T wave inversions more pronounced than baseline.  No STEMI seen on EKG.  Will cycle high-sensitivity troponin times.  Neither an unstable angina/non-STEMI presentation, or hypertensive urgency dilatation.  If the former, but plan for cath tomorrow morning.  If the latter, discharged with close follow-up.  Dr. Einar Gip will be available for any follow-up after 5 PM.  Nigel Mormon, MD [facility] [place] Pager: [phone] Office: [phone]

## 2019-04-01 NOTE — ED Triage Notes (Signed)
Pt arrives to ED from home with complaints of left sided chest tightness this morning exactly where the pain was when he had his heart attack 7 months ago. Patient states he took a baby Asprin which helped the pain go from an 8/10 to a 3/10. Patient states he got SOB and diaphoretic when the tightness started.

## 2019-04-01 NOTE — ED Notes (Signed)
Pt transported to XRAY °

## 2019-04-01 NOTE — ED Provider Notes (Signed)
[facility] EMERGENCY DEPARTMENT Provider Note   CSN: [id number] Arrival date & time: 04/01/19  1215     History Chief Complaint  Patient presents with  . Chest Pain    Adam Burton is a 69 y.o. male.  The history is provided by the patient and medical records. No language interpreter was used.  Chest Pain Pain location:  Substernal area Pain quality: burning, crushing and pressure   Pain radiates to:  Does not radiate Pain severity:  Moderate Onset quality:  Gradual Duration:  1 day Timing:  Constant Progression:  Resolved Chronicity:  Recurrent Ineffective treatments:  None tried Associated symptoms: diaphoresis and shortness of breath   Associated symptoms: no abdominal pain, no back pain, no cough, no fatigue, no fever, no headache, no lower extremity edema, no nausea, no near-syncope, no palpitations and no vomiting   Risk factors: hypertension        Past Medical History:  Diagnosis Date  . Hypercholesteremia   . Hypertension   . Renal insufficiency 08/21/2012    Patient Active Problem List   Diagnosis Date Noted  . Nonrheumatic aortic valve insufficiency 03/02/2019  . Shoulder impingement syndrome, right 10/11/2018  . Coronary artery disease 07/17/2018  . Alcohol use 04/24/2018  . Gastroesophageal reflux disease 10/02/2017  . Esophageal dysphagia 11/20/2016  . Corn of foot 09/19/2016  . Marijuana use 06/27/2016  . Prediabetes 10/10/2015  . Left shoulder pain 05/30/2015  . Discomfort in chest 01/26/2015  . Cataract, left eye 11/03/2014  . Gastritis 02/11/2014  . Impaired fasting glucose 10/19/2013  . Substance abuse ([place]) 09/04/2011  . Erectile dysfunction 09/04/2011  . Hemoptysis 03/29/2011  . Colon cancer screening 03/29/2011  . Prostate cancer screening 03/29/2011  . DOE (dyspnea on exertion) 11/28/2009  . Hyperlipidemia 04/04/2009  . Benign prostatic hyperplasia 04/04/2009  . Essential hypertension 01/23/2009  . NOCTURIA  01/05/2009  . Essential hypertension, benign 01/05/2009    Past Surgical History:  Procedure Laterality Date  . CARDIAC CATHETERIZATION  01/2009   Dr. [place]. Normal coronary arteries. Normal LV function.  . CORONARY ANGIOGRAPHY N/A 08/04/2018   Procedure: CORONARY ANGIOGRAPHY (CATH LAB);  Surgeon: Nigel Mormon, MD;  Location: [facility];  Service: Cardiovascular;  Laterality: N/A;  . CORONARY STENT INTERVENTION N/A 08/04/2018   Procedure: CORONARY STENT INTERVENTION;  Surgeon: Nigel Mormon, MD;  Location: [facility];  Service: Cardiovascular;  Laterality: N/A;  . HERNIA REPAIR    . NM MYOVIEW LTD  12/2008   Potential small area of mild ischemia in the basal inferoseptal wall. Otherwise normal. --> False positive test based on coronary angiography  . Stress Echocardiogram  12/2013   Exercise for just over 4 minutes. Heart rate increased to 148 BPM. Hypertensive response to exercise. EF 60%. No evidence of ischemia. NORMAL STUDY  . TRANSTHORACIC ECHOCARDIOGRAM  11/2013   EF 55-60%. No regional wall motion abnormality is. Normal thickness. Mild LA dilation.       Family History  Problem Relation Age of Onset  . Heart attack Mother   . Diabetes Mother   . Diabetes Sister   . Diabetes Maternal Grandmother   . Heart attack Maternal Grandmother   . Diabetes Maternal Aunt   . Colon cancer Neg Hx   . Colon polyps Neg Hx   . Esophageal cancer Neg Hx   . Rectal cancer Neg Hx   . Stomach cancer Neg Hx     Social History   Tobacco Use  .  Smoking status: Never Smoker  . Smokeless tobacco: Never Used  Substance Use Topics  . Alcohol use: Yes    Comment: beer or vodka occasionally  . Drug use: Yes    Types: Marijuana    Comment: 3-6 X per week    Home Medications Prior to Admission medications   Medication Sig Start Date End Date Taking? Authorizing Provider  acetaminophen (TYLENOL) 500 MG tablet Take 2 tablets (1,000 mg total) by mouth every 8  (eight) hours as needed for moderate pain. 10/07/18   Benay Pike, MD  aspirin EC 81 MG tablet Take 81 mg by mouth daily.    [provider]  carboxymethylcellulose (REFRESH TEARS) 0.5 % SOLN Place 1 drop into both eyes 3 (three) times daily as needed (irritation).    [provider]  lisinopril-hydrochlorothiazide (ZESTORETIC) 20-25 MG tablet Take 1 tablet by mouth once daily 12/09/18   Patwardhan, Manish J, MD  meloxicam (MOBIC) 15 MG tablet Take 1 tablet daily with food for 7 days. Then take as needed. 03/03/19   Inez Catalina, MD  metoprolol succinate (TOPROL-XL) 25 MG 24 hr tablet Take 1 tablet (25 mg total) by mouth daily. Take with or immediately following a meal. 11/12/18 03/02/19  Patwardhan, Reynold Bowen, MD  nitroGLYCERIN (NITRODUR - DOSED IN MG/24 HR) 0.2 mg/hr patch Use 1/4 patch daily to the affected area. 03/03/19   Inez Catalina, MD  nitroGLYCERIN (NITROSTAT) 0.4 MG SL tablet Place 0.4 mg under the tongue every 5 (five) minutes as needed for chest pain.    [provider]  omeprazole (PRILOSEC) 20 MG capsule Take 1 capsule (20 mg total) by mouth daily. 04/24/18   [name], MD  rosuvastatin (CRESTOR) 20 MG tablet Take 20 mg by mouth daily.    [provider]    Allergies    Patient has no known allergies.  Review of Systems   Review of Systems  Constitutional: Positive for diaphoresis. Negative for chills, fatigue and fever.  HENT: Negative for congestion.   Respiratory: Positive for shortness of breath. Negative for cough and chest tightness.   Cardiovascular: Positive for chest pain. Negative for palpitations, leg swelling and near-syncope.  Gastrointestinal: Negative for abdominal pain, constipation, diarrhea, nausea and vomiting.  Genitourinary: Negative for dysuria and frequency.  Musculoskeletal: Negative for back pain, neck pain and neck stiffness.  Skin: Negative for rash and wound.  Neurological: Negative for light-headedness  and headaches.  Psychiatric/Behavioral: Negative for agitation and confusion.  All other systems reviewed and are negative.   Physical Exam Updated Vital Signs BP (!) 172/92 (BP Location: Left Arm)   Pulse 74   Temp 97.6 F (36.4 C) (Oral)   Resp 12   SpO2 100%   Physical Exam Vitals and nursing note reviewed.  Constitutional:      General: He is not in acute distress.    Appearance: He is well-developed. He is not ill-appearing, toxic-appearing or diaphoretic.  HENT:     Head: Normocephalic and atraumatic.  Eyes:     Conjunctiva/sclera: Conjunctivae normal.     Pupils: Pupils are equal, round, and reactive to light.  Cardiovascular:     Rate and Rhythm: Normal rate and regular rhythm.     Heart sounds: Murmur present.  Pulmonary:     Effort: Pulmonary effort is normal. No respiratory distress.     Breath sounds: Normal breath sounds. No decreased breath sounds, wheezing, rhonchi or rales.  Chest:     Chest wall: No tenderness.  Abdominal:     Palpations: Abdomen is soft.     Tenderness: There is no abdominal tenderness.  Musculoskeletal:     Cervical back: Neck supple.     Right lower leg: No tenderness. No edema.     Left lower leg: No tenderness. No edema.  Skin:    General: Skin is warm and dry.     Capillary Refill: Capillary refill takes less than 2 seconds.  Neurological:     General: No focal deficit present.     Mental Status: He is alert.  Psychiatric:        Mood and Affect: Mood normal.     ED Results / Procedures / Treatments   Labs (all labs ordered are listed, but only abnormal results are displayed) Labs Reviewed  BASIC METABOLIC PANEL - Abnormal; Notable for the following components:      Result Value   Potassium 3.4 (*)    Glucose, Bld 159 (*)    Creatinine, Ser 1.41 (*)    GFR calc non Af Amer 50 (*)    GFR calc Af Amer 58 (*)    All other components within normal limits  RESPIRATORY PANEL BY RT PCR (FLU A&B, COVID)  CBC  HEPATIC  FUNCTION PANEL  TROPONIN I (HIGH SENSITIVITY)  TROPONIN I (HIGH SENSITIVITY)    EKG None EKG interpreted by cards  Radiology DG Chest 2 View  Result Date: 04/01/2019 CLINICAL DATA:  Generalized chest pain for 1 day, hypertension EXAM: CHEST - 2 VIEW COMPARISON:  04/19/2011 FINDINGS: The heart size and mediastinal contours are within normal limits. Both lungs are clear. The visualized skeletal structures are unremarkable. IMPRESSION: No active cardiopulmonary disease. Electronically Signed   By: Randa Ngo M.D.   On: 04/01/2019 13:23    Procedures Procedures (including critical care time)  Medications Ordered in ED Medications  sodium chloride flush (NS) 0.9 % injection 3 mL (has no administration in time range)  hydrALAZINE (APRESOLINE) injection 10 mg (10 mg Intravenous Given 04/01/19 1335)    ED Course  I have reviewed the triage vital signs and the nursing notes.  Pertinent labs & imaging results that were available during my care of the patient were reviewed by me and considered in my medical decision making (see chart for details).    MDM Rules/Calculators/A&P                      Jemel Haris is a 69 y.o. male with a past medical history significant for hypertension, hyperlipidemia, aortic insufficiency, and CAD status post PCI who presents with chest pain.  Patient reports that this morning he woke up and has been having chest pain that feels similar to prior MI last year.  He reports the pain is moderate but has improved in the emergency department.  He reported some shortness of breath and diaphoresis with it.  Denies nausea, vomiting, or trauma.  Denies any leg pain or leg swelling that is worsened.  He denies history of DVT or PE.  On arrival, patient was being seen by cardiology.  Patient was found to have blood pressure elevated in the 170s.  Cardiology requested hydralazine administration which was provided.  Patient is chest pain-free at this time.  In exam,  lungs clear and chest is nontender.  Abdomen is nontender.  Good pulses in extremities.  Vital signs reassuring on reassessment.  Dr. Virgina Jock with cardiology saw the patient and feels that if his initial and delta troponin are  negative and he remains chest pain-free, he will be stable for discharge home for close outpatient follow-up.  If his troponins turn positive, he will admit the patient.  Patient is agreeable to this plan and is being monitored on telemetry while waiting for labs to return.  3:56 PM Second troponin was undetectable as well.  Patient still is chest pain-free.  Covid test negative.  Patient agrees with discharge home to follow-up with outpatient cardiology.  Patient will be discharged.    Final Clinical Impression(s) / ED Diagnoses Final diagnoses:  Nonspecific chest pain     Clinical Impression: 1. Nonspecific chest pain     Disposition: Care transferred to oncoming team while awaiting results of delta troponin.  Anticipate discharge at the direction of cardiology if troponins are reassuring.   This note was prepared with assistance of Systems analyst. Occasional wrong-word or sound-a-like substitutions may have occurred due to the inherent limitations of voice recognition software.      Dawanda Mapel, Gwenyth Allegra, MD 04/01/19 [phone]

## 2019-04-01 NOTE — Progress Notes (Signed)
Unexplained episode of chest and shortness of breath on 04/01/19. Trop HS <2. Abnormal resting EKG. Will obtain lexiscan stress test and f/u after that.  Nigel Mormon, MD [facility] [place] Pager: [phone] Office: [phone]

## 2019-04-02 DIAGNOSIS — M7582 Other shoulder lesions, left shoulder: Secondary | ICD-10-CM | POA: Diagnosis not present

## 2019-04-05 ENCOUNTER — Other Ambulatory Visit: Payer: Self-pay

## 2019-04-05 ENCOUNTER — Ambulatory Visit (INDEPENDENT_AMBULATORY_CARE_PROVIDER_SITE_OTHER): Payer: Medicare Other

## 2019-04-05 DIAGNOSIS — I2510 Atherosclerotic heart disease of native coronary artery without angina pectoris: Secondary | ICD-10-CM

## 2019-04-08 ENCOUNTER — Ambulatory Visit (INDEPENDENT_AMBULATORY_CARE_PROVIDER_SITE_OTHER): Payer: Medicare Other | Admitting: Cardiology

## 2019-04-08 ENCOUNTER — Other Ambulatory Visit: Payer: Self-pay

## 2019-04-08 ENCOUNTER — Encounter: Payer: Self-pay | Admitting: Cardiology

## 2019-04-08 VITALS — BP 148/89 | HR 73 | Ht 71.0 in | Wt 178.0 lb

## 2019-04-08 DIAGNOSIS — I2510 Atherosclerotic heart disease of native coronary artery without angina pectoris: Secondary | ICD-10-CM | POA: Diagnosis not present

## 2019-04-08 DIAGNOSIS — I10 Essential (primary) hypertension: Secondary | ICD-10-CM | POA: Diagnosis not present

## 2019-04-08 DIAGNOSIS — I351 Nonrheumatic aortic (valve) insufficiency: Secondary | ICD-10-CM | POA: Diagnosis not present

## 2019-04-08 DIAGNOSIS — E782 Mixed hyperlipidemia: Secondary | ICD-10-CM | POA: Diagnosis not present

## 2019-04-08 MED ORDER — METOPROLOL SUCCINATE ER 50 MG PO TB24: 50.0000 mg | ORAL_TABLET | Freq: Every day | ORAL | 3 refills | Status: DC

## 2019-04-08 NOTE — Progress Notes (Signed)
Follow up visit  Subjective:   Adam Burton, male    DOB: [birth year], 69 y.o.   MRN: [id number]   Chief Complaint  Patient presents with  . Coronary Artery Disease  . Hyperlipidemia  . Hospitalization Follow-up     69 y/o African AmericanmaleCAD s/p prox-mid LAD 07/2017, hypertension, GERD, rotator cuff injury.  His primary complaint continues to be right shoulder pain. He has not seen sport medicine in a while. He has been using over the counter pain relief sprays (Non-NSAIDS). He denies chest pain, shortness of breath, palpitations, leg edema, orthopnea, PND, TIA/syncope.  Patient was seen in [facility] emergency department last week with episode of chest pain and retrosternal burning sensation. He was monitored with serial high-sensitivity troponin which were less than 2. EKG showed nonspecific ST-T changes. He was discharged home with follow-up stress test. SPECT imaging did not show any significant perfusion defect, he did have profound EKG changes on minimal walking. He is here for follow-up today.  He has not had any recurrent chest pain episodes. He endorses having had high salt intake ion the morning of his episode of chest pain, associated with hypertension, in 03/2019.    Current Outpatient Medications on File Prior to Visit  Medication Sig Dispense Refill  . acetaminophen (TYLENOL) 500 MG tablet Take 2 tablets (1,000 mg total) by mouth every 8 (eight) hours as needed for moderate pain. 30 tablet 0  . aspirin EC 81 MG tablet Take 81 mg by mouth daily.    [name] lisinopril-hydrochlorothiazide (ZESTORETIC) 20-25 MG tablet Take 1 tablet by mouth once daily 90 tablet 1  . meloxicam (MOBIC) 15 MG tablet Take 1 tablet daily with food for 7 days. Then take as needed. 40 tablet 0  . metoprolol succinate (TOPROL-XL) 25 MG 24 hr tablet Take 1 tablet (25 mg total) by mouth daily. Take with or immediately following a meal. 30 tablet 2  . nitroGLYCERIN (NITRODUR - DOSED IN MG/24 HR) 0.2  mg/hr patch Use 1/4 patch daily to the affected area. 30 patch 1  . rosuvastatin (CRESTOR) 20 MG tablet Take 20 mg by mouth daily.    . nitroGLYCERIN (NITROSTAT) 0.4 MG SL tablet Place 0.4 mg under the tongue every 5 (five) minutes as needed for chest pain.     No current facility-administered medications on file prior to visit.    Cardiovascular studies:  [name] (Walking with mod Bruce)Tetrofosmin Stress Test  04/05/2019: Nondiagnostic ECG stress. Perfusion images revealed diaphragmatic attenuation artifact in the inferior wall without evidence of ischemia or scar.  The left ventricle is mildly dilated.  There was no regional wall motion abnormality.  Mild global hypokinesis.  Calculated LVEF moderately reduced at 39%, visually EF appears to be 45%.  Low risk study.   Compared 12/08/2017, no significant change in perfusion, however there was significant EKG abnormalities noted associated with chest pain on the treadmill stress test.  Clinical correlation recommended. Low risk study.  EKG 11/12/2018: Ectopic atrial rhythm 74 bpm, new since last EKG in 07/2018. Nonspecific T-abnormality.   Coronary angiography and intervention 08/04/2018: LM: Normal LAD: Prox-mid 75% stenosis. CTA FFR 0.75.         Diag 1 small vessel with 70% disease LCx: Normal RCA: Prox 60% stenosis. CTA FFR 0.85  Successful percutaneous coronary intervention prox-mid LAD PTCA and stent placement Resolute Onyx 3.5 X 22 mm drug-eluting stent Recommend medical management for small vessel diag disease, and RCA nonobstructive disease.  CTA 01/28/2018: EXAM: CT FFR ANALYSIS  CLINICAL DATA: 69 year old male with chest pain.  FINDINGS: FFRct analysis was performed on the original cardiac CT angiogram dataset. Diagrammatic representation of the FFRct analysis is provided in a separate PDF document in PACS. This dictation was created using the PDF document and an interactive 3D model of the results. 3D model is not  available in the EMR/PACS. Normal FFR range is >0.80.  1. Left Main: No significant stenosis. 2. LAD: Proximal: 0.95, mid: 0.75. 3. LCX: No significant stenosis. 4. RCA: Proximal: 0.98, mid: 0.85, distal: 0.81.  IMPRESSION: 1. CT FFR analysis showed severe stenosis in the proximal LAD. A cardiac catheterization is recommended.  Exercise myoview stress 12/08/2017: 1. Resting EKG demonstrates normal sinus rhythm, LVH with nonspecific inferolateral T-wave abnormality.  Stress EKG is equivocal for ischemia with 2 mm upsloping ST segment depression noted in inferior and lateral leads that normalized to 2 minutes into recovery.  Stress symptoms included chest tightness. resting blood pressure 140/96 and peak blood pressure 134/94 mmHg.  Patient exercised for 6:54 minutes and achieved 8.43 Mets.  Stress terminated due to fatigue and 88% of MPHR achieved. 2.  The left ventricle was mildly dilated, both in rest and stress images at 124 mL. Stress and rest SPECT images demonstrate homogeneous tracer distribution throughout the myocardium. Gated SPECT imaging reveals global hypokinesis with EF calculated at 36%. 3. This is a high risk study, in view of abnormal EKG response, chest pain and reduced EF.  Echocardiogram 12/02/2013: - Left ventricle: Global LV strain is -19.4% The cavity size was  normal. Systolic function was normal. The estimated ejection  fraction was in the range of 55% to 60%. Wall motion was normal;  there were no regional wall motion abnormalities.  - Aortic valve: Trileaflet; normal thickness, mildly calcified  leaflets.  - Left atrium: The atrium was mildly dilated.   Recent labs: 07/27/2018: Glucose 110, BUN/Cr 11/1.36. EGFR 61. Na/K 139/3.9.  H/H 14/42. MCV 87. Platelets 308 HbA1C 6.2% Chol 143, TG 86, HDL 58, LDL 68  Review of Systems  Cardiovascular: Negative for chest pain, dyspnea on exertion, leg swelling, palpitations and syncope.       Vitals:    04/08/19 1115 04/08/19 1119  BP: (!) 153/95 (!) 148/89  Pulse: 78 73  SpO2: 98% 98%     Body mass index is 24.83 kg/m. Filed Weights   04/08/19 1115  Weight: 178 lb (80.7 kg)     Objective:   Physical Exam  Constitutional: He appears well-developed and well-nourished.  Neck: No JVD present.  Cardiovascular: Normal rate, regular rhythm, normal heart sounds and intact distal pulses.  No murmur heard. Pulmonary/Chest: Effort normal and breath sounds normal. He has no wheezes. He has no rales.  Musculoskeletal:        General: No edema.  Nursing note and vitals reviewed.         Assessment & Recommendations:   69 y/o African AmericanmaleCAD s/p prox-mid LAD 07/2017, hypertension, GERD, rotator cuff injury.  CAD: ED admission 03/2019 with chest pain. MI excluded. Subsequent stress test with no significant perfusion defect, but ST depression-likely false positive. In absence of any recurrent chest pain symptoms, recommend continued medical management.  Increase metoprolol succinate to 50 mg daily. Continue aspirin, statin  Hypertension: Suboptimal control. Increased metoprolol succinate to 50 mg daily.   Aortic regurgitation: Moderate, asymptomatic. Repeat echocardiogram in 1 year.   F/u in June 2021.  Nigel Mormon, MD [facility] Pager: [phone] Office: [phone] If no answer Cell  [phone]

## 2019-04-22 ENCOUNTER — Ambulatory Visit: Payer: Medicare Other

## 2019-05-03 DIAGNOSIS — M7582 Other shoulder lesions, left shoulder: Secondary | ICD-10-CM | POA: Diagnosis not present

## 2019-05-29 ENCOUNTER — Ambulatory Visit: Payer: Medicare Other | Attending: Family

## 2019-05-29 DIAGNOSIS — Z23 Encounter for immunization: Secondary | ICD-10-CM

## 2019-05-29 NOTE — Progress Notes (Signed)
   Covid-19 Vaccination Clinic  Name:  Adam Burton    MRN: [id number] DOB: 1950-12-31  05/29/2019  [name] was observed post Covid-19 immunization for 15 minutes without incident. He was provided with Vaccine Information Sheet and instruction to access the V-Safe system.   [name] was instructed to call 911 with any severe reactions post vaccine: [name] Difficulty breathing  . Swelling of face and throat  . A fast heartbeat  . A bad rash all over body  . Dizziness and weakness   Immunizations Administered    Name Date Dose VIS Date Route   JANSSEN COVID-19 VACCINE 05/29/2019  1:13 PM 0.5 mL 04/24/2019 Intramuscular   Manufacturer: Alphonsa Overall   Lot: [id number]   [place]: [id number]

## 2019-06-15 ENCOUNTER — Ambulatory Visit (INDEPENDENT_AMBULATORY_CARE_PROVIDER_SITE_OTHER): Payer: Medicare Other | Admitting: Family Medicine

## 2019-06-15 ENCOUNTER — Other Ambulatory Visit: Payer: Self-pay

## 2019-06-15 VITALS — BP 121/70 | HR 60 | Ht 71.0 in | Wt 181.6 lb

## 2019-06-15 DIAGNOSIS — E119 Type 2 diabetes mellitus without complications: Secondary | ICD-10-CM

## 2019-06-15 DIAGNOSIS — N401 Enlarged prostate with lower urinary tract symptoms: Secondary | ICD-10-CM

## 2019-06-15 DIAGNOSIS — E782 Mixed hyperlipidemia: Secondary | ICD-10-CM

## 2019-06-15 DIAGNOSIS — I10 Essential (primary) hypertension: Secondary | ICD-10-CM

## 2019-06-15 DIAGNOSIS — R7303 Prediabetes: Secondary | ICD-10-CM

## 2019-06-15 DIAGNOSIS — R3912 Poor urinary stream: Secondary | ICD-10-CM

## 2019-06-15 DIAGNOSIS — Z0000 Encounter for general adult medical examination without abnormal findings: Secondary | ICD-10-CM

## 2019-06-15 LAB — POCT GLYCOSYLATED HEMOGLOBIN (HGB A1C): HbA1c, POC (controlled diabetic range): 7.2 % — AB (ref 0.0–7.0)

## 2019-06-15 MED ORDER — METFORMIN HCL ER 500 MG PO TB24: 500.0000 mg | ORAL_TABLET | Freq: Every day | ORAL | 1 refills | Status: DC

## 2019-06-15 MED ORDER — TETANUS-DIPHTH-ACELL PERTUSSIS 5-2.5-18.5 LF-MCG/0.5 IM SUSP: 0.5000 mL | Freq: Once | INTRAMUSCULAR | 0 refills | Status: AC

## 2019-06-15 NOTE — Assessment & Plan Note (Signed)
Endorsing polyuria/polydipsia, likely related to new diabetes and pre existing BPH. Did not address this issue fully today. Patient prefers to see urology, will enter referral

## 2019-06-15 NOTE — Assessment & Plan Note (Signed)
New diagnosis today with A1c of 7.2.  Start metformin XR 500mg  daily Foot exam normal today Advised to get eye exam Already on ACE inhibitor, statin, aspirin Refer to nutritionist for diabetes education Follow up in 3 months for next A1c

## 2019-06-15 NOTE — Assessment & Plan Note (Signed)
Check lipids today. Continue crestor.

## 2019-06-15 NOTE — Progress Notes (Signed)
Date of Visit: 06/15/2019   HPI:  Patient presents today for a well adult male exam.   Concerns today: bump on his neck, just noticed it the other day Sexual activity: yes, less active in the last year due to [place] pandemic STD Screening: declines today Exercise: not much lately, plans to start back walking regularly Smoking: no tobacco ever Alcohol: less than previous, occasional beer, shot of vodka a few times a week, not every day Drugs: daily marijuana use, wants to cut back/quit Mood: no concerns  Hyperlipidemia - taking crestor 20mg  daily  Hypertension - taking lisin-HCTZ 20-25mg  daily. Tolerating well.  CAD - compliant with follow up with cardiology. Taking aspirin and plavix. Has nitroglycerin at home, has not needed it  Diabetes - new diagnosis today. A1c at insurance NP home visit was 7.0, today is 7.2. strong family history of diabetes. Endorses frequent urination and drinking lots of water. Also notes urine stream has been less lately. No dysuria.  ROS: See HPI  [place]:  Cancers in family: no  PHYSICAL EXAM: BP 121/70   Pulse 60   Ht 5\' 11"  (1.803 m)   Wt 181 lb 9.6 oz (82.4 kg)   SpO2 99%   BMI 25.33 kg/m  Gen: NAD, pleasant, cooperative HEENT: NCAT, PERRL, small superficial 0.5cm skin cyst on R neck, gentle squeezing produces small amount of sebaceous material Heart: RRR, no murmurs Lungs: CTAB, NWOB Abdomen: soft, nontender to palpation Neuro: grossly nonfocal, speech normal Diabetic foot exam: 2+ DP pulses bilat, normal monofilament testing bilaterally. No lesions or significant calluses.   ASSESSMENT/PLAN:  Health maintenance:  -STD screening: declines today -immunizations: . Tdap: gave rx, advised to get at his pharmacy . COVID: UTD . Pneumovax: UTD -lipid screening: check lipids today -colon cancer screening: per patient, sent in cologuard 1 week ago, await results -AAA screening: not indicated (never used tobacco) -lung cancer screening:  n/a -prostate cancer screening: offered PSA today, patient prefers to wait until urology appt -handout given on health maintenance topics  Type 2 diabetes mellitus (Adam Burton) New diagnosis today with A1c of 7.2.  Start metformin XR 500mg  daily Foot exam normal today Advised to get eye exam Already on ACE inhibitor, statin, aspirin Refer to nutritionist for diabetes education Follow up in 3 months for next A1c  Benign prostatic hyperplasia Endorsing polyuria/polydipsia, likely related to new diabetes and pre existing BPH. Did not address this issue fully today. Patient prefers to see urology, will enter referral  Essential hypertension, benign Well controlled. Continue current medication regimen.   Mixed hyperlipidemia Check lipids today. Continue crestor.   Small sebaceous cyst/pimple - no signs of infection. Advised warm compresses, can squeeze at home to remove material if he desires.  FOLLOW UP: Follow up in 3 months for next A1c Referring to urology  Adam Burton, [place]

## 2019-06-15 NOTE — Assessment & Plan Note (Signed)
Well controlled. Continue current medication regimen.  

## 2019-06-15 NOTE — Patient Instructions (Addendum)
It was great to see you again today!  Checking cholesterol and kidney function today  Schedule eye exam  Referring to urologist and nutritionist  Get your tetanus shot  Start metformin 500mg  daily - sent in prescription  Follow up in 3 months   Call with any questions or concerns  Be well, Dr. Ardelia Mems

## 2019-06-16 LAB — BASIC METABOLIC PANEL
BUN/Creatinine Ratio: 13 (ref 10–24)
BUN: 19 mg/dL (ref 8–27)
CO2: 22 mmol/L (ref 20–29)
Calcium: 9.5 mg/dL (ref 8.6–10.2)
Chloride: 97 mmol/L (ref 96–106)
Creatinine, Ser: 1.41 mg/dL — ABNORMAL HIGH (ref 0.76–1.27)
GFR calc Af Amer: 58 mL/min/{1.73_m2} — ABNORMAL LOW (ref >59)
GFR calc non Af Amer: 50 mL/min/{1.73_m2} — ABNORMAL LOW (ref >59)
Glucose: 105 mg/dL — ABNORMAL HIGH (ref 65–99)
Potassium: 4.3 mmol/L (ref 3.5–5.2)
Sodium: 137 mmol/L (ref 134–144)

## 2019-06-16 LAB — LIPID PANEL
Chol/HDL Ratio: 2.1 ratio (ref 0.0–5.0)
Cholesterol, Total: 127 mg/dL (ref 100–199)
HDL: 60 mg/dL (ref >39)
LDL Chol Calc (NIH): 53 mg/dL (ref 0–99)
Triglycerides: 71 mg/dL (ref 0–149)
VLDL Cholesterol Cal: 14 mg/dL (ref 5–40)

## 2019-06-17 ENCOUNTER — Other Ambulatory Visit: Payer: Self-pay | Admitting: Cardiology

## 2019-06-17 ENCOUNTER — Encounter: Payer: Self-pay | Admitting: Family Medicine

## 2019-06-17 DIAGNOSIS — I10 Essential (primary) hypertension: Secondary | ICD-10-CM

## 2019-06-23 NOTE — Telephone Encounter (Signed)
complete

## 2019-07-06 ENCOUNTER — Other Ambulatory Visit: Payer: Self-pay

## 2019-07-06 DIAGNOSIS — I10 Essential (primary) hypertension: Secondary | ICD-10-CM

## 2019-07-06 MED ORDER — LISINOPRIL-HYDROCHLOROTHIAZIDE 20-25 MG PO TABS: 1.0000 | ORAL_TABLET | Freq: Every day | ORAL | 3 refills | Status: DC

## 2019-07-19 ENCOUNTER — Telehealth: Payer: Self-pay

## 2019-07-19 NOTE — Telephone Encounter (Signed)
Adam Burton, can you check on this Cologuard result? I didn't order it, I think he got it through his insurance. Let me know if I need to order it for him to send in another sample.  Thanks [name], MD

## 2019-07-19 NOTE — Telephone Encounter (Signed)
Cologuard has no record in their system for this patient. I'm not sure which test his insurance company used, but it doesn't look like it was cologuard.

## 2019-07-19 NOTE — Telephone Encounter (Signed)
Patient calls nurse line stating he did a send out colon cancer screen through his insurance. Patient stated he received a letter from "a" company stating the sample was insufficient and a new one would need to be sent back out. Patient is unable to tell me the company. I see where a referral was placed for a colonoscopy with [facility], however the apt was never made. Unsure if we could just place an order for a cologuard. Please advise.

## 2019-07-29 ENCOUNTER — Ambulatory Visit: Payer: Medicare Other | Admitting: Dietician

## 2019-08-04 NOTE — Progress Notes (Signed)
  Date of Visit: 08/05/2019   SUBJECTIVE:   HPI:  Adam Burton presents today for routine follow up.  Diabetes - last visit added metformin XR 500mg  daily. Tolerating well without issues. Missed his appointment with nutritionist, but has rescheduled for end of the month.  Hypertension - needs refill of metoprolol XL 50mg  daily. Also taking lisinopril-HCTZ 20-25mg  daily.  CAD - taking both aspirin 81mg  and plavix 75mg  daily. He is unsure how long he is supposed to continue on DAPT. Had stent placed 08/04/18.  He would like cologuard for colon cancer screening. No family history of colon cancer. No blood in his stool. Recently sent in FIT test but could not be run.  Has appointment with urology tomorrow.  OBJECTIVE:   BP 112/72   Pulse (!) 57   Wt 173 lb 12.8 oz (78.8 kg)   SpO2 98%   BMI 24.24 kg/m  Gen: no acute distress, pleasant, cooperative HEENT: normocephalic, atraumatic  Lungs: normal work of breathing  Neuro: alert, speech normal Ext: No appreciable lower extremity edema bilaterally   ASSESSMENT/PLAN:   Health maintenance:  -ordered cologuard -given printed rx for Tdap and advised to get it at his pharmacy -patient working on scheduling eye exam  Coronary artery disease Advised to speak with his cardiologist regarding recommended duration of DAPT.  Essential hypertension, benign Well controlled. Continue current medication regimen.  Type 2 diabetes mellitus ([place]) As he is tolerating metformin, increase to XR 1000mg  daily. Follow up in 6 weeks for next A1c. Encouraged to get eye exam.  FOLLOW UP: Follow up in 6 wks for above issues  Adam Burton, [place]

## 2019-08-05 ENCOUNTER — Ambulatory Visit (INDEPENDENT_AMBULATORY_CARE_PROVIDER_SITE_OTHER): Payer: Medicare Other | Admitting: Family Medicine

## 2019-08-05 ENCOUNTER — Other Ambulatory Visit: Payer: Self-pay

## 2019-08-05 ENCOUNTER — Encounter: Payer: Self-pay | Admitting: Family Medicine

## 2019-08-05 VITALS — BP 112/72 | HR 57 | Wt 173.8 lb

## 2019-08-05 DIAGNOSIS — I10 Essential (primary) hypertension: Secondary | ICD-10-CM

## 2019-08-05 DIAGNOSIS — I2510 Atherosclerotic heart disease of native coronary artery without angina pectoris: Secondary | ICD-10-CM

## 2019-08-05 DIAGNOSIS — Z1211 Encounter for screening for malignant neoplasm of colon: Secondary | ICD-10-CM

## 2019-08-05 DIAGNOSIS — E119 Type 2 diabetes mellitus without complications: Secondary | ICD-10-CM

## 2019-08-05 MED ORDER — METOPROLOL SUCCINATE ER 50 MG PO TB24: 50.0000 mg | ORAL_TABLET | Freq: Every day | ORAL | 3 refills | Status: DC

## 2019-08-05 MED ORDER — TETANUS-DIPHTH-ACELL PERTUSSIS 5-2.5-18.5 LF-MCG/0.5 IM SUSP: 0.5000 mL | Freq: Once | INTRAMUSCULAR | 0 refills | Status: AC

## 2019-08-05 MED ORDER — METFORMIN HCL ER (MOD) 1000 MG PO TB24: 1000.0000 mg | ORAL_TABLET | Freq: Every day | ORAL | 3 refills | Status: DC

## 2019-08-05 NOTE — Assessment & Plan Note (Signed)
As he is tolerating metformin, increase to XR 1000mg  daily. Follow up in 6 weeks for next A1c. Encouraged to get eye exam.

## 2019-08-05 NOTE — Assessment & Plan Note (Signed)
Advised to speak with his cardiologist regarding recommended duration of DAPT.

## 2019-08-05 NOTE — Patient Instructions (Signed)
Get eyes checked  Refilled metoprolol today Increase metformin to 1000mg  daily (2 pills until you run out, then 1 pill - sent in higher dose).  Get tetanus shot  cologuard should come to your house  Follow up in 6 weeks  Be well, Dr. Ardelia Mems

## 2019-08-05 NOTE — Assessment & Plan Note (Signed)
Well controlled. Continue current medication regimen.  

## 2019-08-06 ENCOUNTER — Ambulatory Visit: Payer: Medicare Other | Admitting: Cardiology

## 2019-08-06 DIAGNOSIS — R3915 Urgency of urination: Secondary | ICD-10-CM | POA: Diagnosis not present

## 2019-08-18 ENCOUNTER — Other Ambulatory Visit: Payer: Self-pay

## 2019-08-18 ENCOUNTER — Ambulatory Visit: Payer: Medicare Other | Admitting: Cardiology

## 2019-08-18 ENCOUNTER — Encounter: Payer: Self-pay | Admitting: Cardiology

## 2019-08-18 VITALS — BP 125/77 | HR 65 | Resp 17 | Ht 71.0 in | Wt 175.4 lb

## 2019-08-18 DIAGNOSIS — I351 Nonrheumatic aortic (valve) insufficiency: Secondary | ICD-10-CM | POA: Diagnosis not present

## 2019-08-18 DIAGNOSIS — I10 Essential (primary) hypertension: Secondary | ICD-10-CM

## 2019-08-18 DIAGNOSIS — E782 Mixed hyperlipidemia: Secondary | ICD-10-CM | POA: Diagnosis not present

## 2019-08-18 DIAGNOSIS — I2510 Atherosclerotic heart disease of native coronary artery without angina pectoris: Secondary | ICD-10-CM

## 2019-08-18 NOTE — Progress Notes (Signed)
Follow up visit  Subjective:   Burton Burton, male    DOB: October 19, 1950, 69 y.o.   MRN: [id number]   Chief Complaint  Patient presents with  . Coronary Artery Disease  . Hyperlipidemia  . Follow-up    69 y/o African AmericanmaleCAD s/p prox-mid LAD 07/2017, hypertension, GERD, rotator cuff injury.  He has not had any recent chest pain. He had one episdoe where he "blacked out" after having gad a couple of drinks. Blood pressure is well controlled.    Current Outpatient Medications on File Prior to Visit  Medication Sig Dispense Refill  . acetaminophen (TYLENOL) 500 MG tablet Take 2 tablets (1,000 mg total) by mouth every 8 (eight) hours as needed for moderate pain. 30 tablet 0  . aspirin EC 81 MG tablet Take 81 mg by mouth daily.    . clopidogrel (PLAVIX) 75 MG tablet Take 75 mg by mouth daily.    [name] lisinopril-hydrochlorothiazide (ZESTORETIC) 20-25 MG tablet Take 1 tablet by mouth daily. 90 tablet 3  . metFORMIN (GLUMETZA) 1000 MG (MOD) 24 hr tablet Take 1 tablet (1,000 mg total) by mouth daily with breakfast. 90 tablet 3  . metoprolol succinate (TOPROL-XL) 50 MG 24 hr tablet Take 1 tablet (50 mg total) by mouth daily. Take with or immediately following a meal. 90 tablet 3  . nitroGLYCERIN (NITROSTAT) 0.4 MG SL tablet Place 0.4 mg under the tongue every 5 (five) minutes as needed for chest pain.    . rosuvastatin (CRESTOR) 20 MG tablet Take 20 mg by mouth daily.     No current facility-administered medications on file prior to visit.    Cardiovascular studies:  Burton Burton (Walking with mod Bruce)Tetrofosmin Stress Test  04/05/2019: Nondiagnostic ECG stress. Perfusion images revealed diaphragmatic attenuation artifact in the inferior wall without evidence of ischemia or scar.  The left ventricle is mildly dilated.  There was no regional wall motion abnormality.  Mild global hypokinesis.  Calculated LVEF moderately reduced at 39%, visually EF appears to be 45%.  Low risk study.    Compared 12/08/2017, no significant change in perfusion, however there was significant EKG abnormalities noted associated with chest pain on the treadmill stress test.  Clinical correlation recommended. Low risk study.  EKG 11/12/2018: Ectopic atrial rhythm 74 bpm, new since last EKG in 07/2018. Nonspecific T-abnormality.   Coronary angiography and intervention 08/04/2018: LM: Normal LAD: Prox-mid 75% stenosis. CTA FFR 0.75.         Diag 1 small vessel with 70% disease LCx: Normal RCA: Prox 60% stenosis. CTA FFR 0.85  Successful percutaneous coronary intervention prox-mid LAD PTCA and stent placement Resolute Onyx 3.5 X 22 mm drug-eluting stent Recommend medical management for small vessel diag disease, and RCA nonobstructive disease.  CTA 01/28/2018: EXAM: CT FFR ANALYSIS  CLINICAL DATA: 69 year old male with chest pain.  FINDINGS: FFRct analysis was performed on the original cardiac CT angiogram dataset. Diagrammatic representation of the FFRct analysis is provided in a separate PDF document in PACS. This dictation was created using the PDF document and an interactive 3D model of the results. 3D model is not available in the EMR/PACS. Normal FFR range is >0.80.  1. Left Main: No significant stenosis. 2. LAD: Proximal: 0.95, mid: 0.75. 3. LCX: No significant stenosis. 4. RCA: Proximal: 0.98, mid: 0.85, distal: 0.81.  IMPRESSION: 1. CT FFR analysis showed severe stenosis in the proximal LAD. A cardiac catheterization is recommended.  Exercise myoview stress 12/08/2017: 1. Resting EKG demonstrates normal sinus rhythm, LVH with nonspecific  inferolateral T-wave abnormality.  Stress EKG is equivocal for ischemia with 2 mm upsloping ST segment depression noted in inferior and lateral leads that normalized to 2 minutes into recovery.  Stress symptoms included chest tightness. resting blood pressure 140/96 and peak blood pressure 134/94 mmHg.  Patient exercised for 6:54 minutes  and achieved 8.43 Mets.  Stress terminated due to fatigue and 88% of MPHR achieved. 2.  The left ventricle was mildly dilated, both in rest and stress images at 124 mL. Stress and rest SPECT images demonstrate homogeneous tracer distribution throughout the myocardium. Gated SPECT imaging reveals global hypokinesis with EF calculated at 36%. 3. This is a high risk study, in view of abnormal EKG response, chest pain and reduced EF.  Echocardiogram 12/02/2013: - Left ventricle: Global LV strain is -19.4% The cavity size was  normal. Systolic function was normal. The estimated ejection  fraction was in the range of 55% to 60%. Wall motion was normal;  there were no regional wall motion abnormalities.  - Aortic valve: Trileaflet; normal thickness, mildly calcified  leaflets.  - Left atrium: The atrium was mildly dilated.   Recent labs: 07/27/2018: Glucose 110, BUN/Cr 11/1.36. EGFR 61. Na/K 139/3.9.  H/H 14/42. MCV 87. Platelets 308 HbA1C 6.2% Chol 143, TG 86, HDL 58, LDL 68  Review of Systems  Cardiovascular: Negative for chest pain, dyspnea on exertion, leg swelling, palpitations and syncope.       Vitals:   08/18/19 1025  BP: 125/77  Pulse: 65  Resp: 17  SpO2: 100%     Body mass index is 24.46 kg/m. Filed Weights   08/18/19 1025  Weight: 175 lb 6.4 oz (79.6 kg)     Objective:   Physical Exam Vitals and nursing note reviewed.  Constitutional:      Appearance: He is well-developed.  Neck:     Vascular: No JVD.  Cardiovascular:     Rate and Rhythm: Normal rate and regular rhythm.     Pulses: Intact distal pulses.     Heart sounds: Normal heart sounds. No murmur heard.   Pulmonary:     Effort: Pulmonary effort is normal.     Breath sounds: Normal breath sounds. No wheezing or rales.           Assessment & Recommendations:   69 y/o African AmericanmaleCAD s/p prox-mid LAD 07/2017, hypertension, GERD, rotator cuff injury.  CAD: ED admission 03/2019  with chest pain. MI excluded. Subsequent stress test with no significant perfusion defect, but ST depression-likely false positive. In absence of any recurrent chest pain symptoms, recommend continued medical management.  Continue aspirin, statin, metoprolol succinate to 50 mg daily. Stop plavix.  Check lipid panel  Syncope: Most likely related to alcohol intake and possible dehydration. Monitor for now.   Hypertension: Well controlled.  Aortic regurgitation: Moderate, asymptomatic. Repeat echocardiogram in 01/2019  F/u in 6 months  Nigel Mormon, MD [facility] Pager: [phone] Office: [phone] If no answer Cell [phone]

## 2019-08-24 ENCOUNTER — Ambulatory Visit: Payer: Medicare Other | Admitting: Dietician

## 2019-09-03 ENCOUNTER — Encounter: Payer: Self-pay | Admitting: Family Medicine

## 2019-09-03 DIAGNOSIS — Z1211 Encounter for screening for malignant neoplasm of colon: Secondary | ICD-10-CM | POA: Diagnosis not present

## 2019-09-03 DIAGNOSIS — Z1212 Encounter for screening for malignant neoplasm of rectum: Secondary | ICD-10-CM | POA: Diagnosis not present

## 2019-09-03 LAB — COLOGUARD: Cologuard: POSITIVE — AB

## 2019-09-17 ENCOUNTER — Telehealth: Payer: Self-pay | Admitting: Family Medicine

## 2019-09-17 NOTE — Telephone Encounter (Signed)
Called patient to discuss positive Cologuard test. He answered on the second try. Advised of positive test, and need for further evaluation with colonoscopy. He prefers to wait and discuss it at his appointment with me on Tuesday of next week. Will discuss more then.  [name], MD

## 2019-09-21 ENCOUNTER — Ambulatory Visit (INDEPENDENT_AMBULATORY_CARE_PROVIDER_SITE_OTHER): Payer: Medicare Other | Admitting: Family Medicine

## 2019-09-21 ENCOUNTER — Other Ambulatory Visit: Payer: Self-pay

## 2019-09-21 ENCOUNTER — Encounter: Payer: Medicare Other | Attending: Family Medicine | Admitting: Nutrition

## 2019-09-21 VITALS — BP 122/75 | HR 73 | Ht 71.0 in | Wt 171.6 lb

## 2019-09-21 DIAGNOSIS — R3915 Urgency of urination: Secondary | ICD-10-CM | POA: Diagnosis not present

## 2019-09-21 DIAGNOSIS — I10 Essential (primary) hypertension: Secondary | ICD-10-CM

## 2019-09-21 DIAGNOSIS — R3121 Asymptomatic microscopic hematuria: Secondary | ICD-10-CM | POA: Diagnosis not present

## 2019-09-21 DIAGNOSIS — E119 Type 2 diabetes mellitus without complications: Secondary | ICD-10-CM | POA: Diagnosis not present

## 2019-09-21 DIAGNOSIS — R195 Other fecal abnormalities: Secondary | ICD-10-CM

## 2019-09-21 LAB — POCT GLYCOSYLATED HEMOGLOBIN (HGB A1C): HbA1c, POC (controlled diabetic range): 6.4 % (ref 0.0–7.0)

## 2019-09-21 MED ORDER — TETANUS-DIPHTH-ACELL PERTUSSIS 5-2.5-18.5 LF-MCG/0.5 IM SUSP: 0.5000 mL | Freq: Once | INTRAMUSCULAR | 0 refills | Status: AC

## 2019-09-21 NOTE — Patient Instructions (Signed)
It was great to see you again today!  Referring to GI doctor for colonoscopy  Go get your tetanus shot at your pharmacy  Follow up in 3 months for diabetes   Be well, Dr. Ardelia Mems

## 2019-09-21 NOTE — Progress Notes (Signed)
    SUBJECTIVE:   HPI:    Cologuard follow-up: had positive cologuard. Patient would like some time before procedure to mentally prepare but is agreeable to getting colonoscopy.  Essential Hypertension, benign-Taking Metoprolol succinate 50mg  XL daily. Not taking Hydrochlorothiazide.  Type 2 Diabetes Mellitus-Taking Metformin XL 1000mg  daily (takes 2 500mg  tabs). No concerns. A1C of 6.4 today.  Has appointment with Urology today for his prostate - was started on flomax 0.4mg  daily there with some improvement   OBJECTIVE:   BP 122/75   Pulse 73   Ht 5\' 11"  (1.803 m)   Wt 171 lb 9.6 oz (77.8 kg)   SpO2 100%   BMI 23.93 kg/m   General: Well appearing, no acute distress. Alert, pleasant and cooperative.  HEENT: normocephalic, atraumatic CV: Regular rate and rhythm, no murmur Pulmonary: Normal work of breathing, clear to auscultation bilaterally   ASSESSMENT/PLAN:   Health maintenance:  -Re-printed rx for Tdap; advised to get at his pharmacy -Patient scheduled for at home eye exam this Thursday (i.e in two days) -urine microalbumin:creat ratio collected today  Positive colorectal cancer screening using Cologuard test -Advised on general details of colonoscopy, including benefits -Colonoscopy referral   Essential hypertension, benign Well controlled. Continue on medication.  Type 2 diabetes mellitus (H[facility]) A1c at goal, continue metformin, follow up in 3 months     [place]   Patient seen along with MS3 student Romeo Apple. I personally evaluated this patient along with the student, and verified all aspects of the history, physical exam, and medical decision making as documented by the student. I agree with the student's documentation and have made all necessary edits.  Chrisandra Netters, MD  [place]

## 2019-09-21 NOTE — Assessment & Plan Note (Signed)
A1c at goal, continue metformin, follow up in 3 months

## 2019-09-21 NOTE — Assessment & Plan Note (Signed)
-  Advised on general details of colonoscopy, including benefits -Colonoscopy referral

## 2019-09-21 NOTE — Progress Notes (Signed)
error 

## 2019-09-21 NOTE — Assessment & Plan Note (Signed)
Well controlled. Continue on medication.

## 2019-09-22 ENCOUNTER — Encounter: Payer: Self-pay | Admitting: Family Medicine

## 2019-09-22 LAB — MICROALBUMIN / CREATININE URINE RATIO
Creatinine, Urine: 202.3 mg/dL
Microalb/Creat Ratio: 13 mg/g creat (ref 0–29)
Microalbumin, Urine: 25.8 ug/mL

## 2019-09-23 ENCOUNTER — Encounter: Payer: Self-pay | Admitting: Family Medicine

## 2019-09-23 LAB — HM DIABETES EYE EXAM

## 2019-10-12 ENCOUNTER — Telehealth: Payer: Self-pay | Admitting: *Deleted

## 2019-10-12 NOTE — Telephone Encounter (Signed)
Patient called and left message on referral line asking for a call back from Dr. Ardelia Mems or team.  He didn't specify what he wanted to discuss.  Kloi Brodman,CMA

## 2019-10-14 NOTE — Telephone Encounter (Signed)
Pt stated that he wanted to make an appt for his colonoscopy. Informed pt that [place] attempted to call him but left a message. Gave him the number to call to set it up. Deseree Kennon Holter, CMA

## 2019-11-10 ENCOUNTER — Telehealth: Payer: Self-pay

## 2019-11-10 NOTE — Telephone Encounter (Signed)
Patient calls nurse line requesting to speak with Dr. Ardelia Mems due to the following concerns. Patient reports that he fell into the wall at his apartment around 2 months ago. Patient states that since then he has been having slight headaches with intermittent tingling sensation, starting at neck and radiating into left shoulder. Patient denies severe headaches, visual changes, arm weakness, facial drooping and difficulties with speech. Patient requesting to schedule appointment with PCP to discuss, however no available appointments until October. Offered to schedule patient sooner appointment with different provider, however, patient declined and wanted to wait for further instruction from Dr. Ardelia Mems.   Strict ED precautions given.   Talbot Grumbling, RN

## 2019-11-12 ENCOUNTER — Encounter: Payer: Self-pay | Admitting: Physician Assistant

## 2019-11-12 ENCOUNTER — Ambulatory Visit (INDEPENDENT_AMBULATORY_CARE_PROVIDER_SITE_OTHER): Payer: Medicare Other | Admitting: Physician Assistant

## 2019-11-12 VITALS — BP 118/74 | HR 64 | Ht 71.0 in | Wt 172.0 lb

## 2019-11-12 DIAGNOSIS — R195 Other fecal abnormalities: Secondary | ICD-10-CM

## 2019-11-12 MED ORDER — PLENVU 140 G PO SOLR: 1.0000 | ORAL | 0 refills | Status: DC

## 2019-11-12 NOTE — Progress Notes (Signed)
Subjective:    Patient ID: Adam Burton, male    DOB: Dec 15, 1950, 69 y.o.   MRN: [id number]  HPI Adam Burton is a pleasant 69 year old African-American male, established with Dr. Loletha Carrow and last seen in 2018 when he underwent EGD which was normal. Patient is referred back today for positive Cologuard stool DNA test. Patient has not noted any melena or hematochezia, he has no complaints of abdominal discomfort or changes in bowel habits.  No prior colonoscopy, no family history of colon cancer or polyps that he is aware of. He does have history of adult onset diabetes mellitus, hypertension, and coronary artery disease for which she had undergone LAD stent in 2019.  EF 45% per cardiologist. He is not on any antiplatelet therapy.  Review of Systems Pertinent positive and negative review of systems were noted in the above HPI section.  All other review of systems was otherwise negative.  Outpatient Encounter Medications as of 11/12/2019  Medication Sig  . acetaminophen (TYLENOL) 500 MG tablet Take 2 tablets (1,000 mg total) by mouth every 8 (eight) hours as needed for moderate pain.  [name] aspirin EC 81 MG tablet Take 81 mg by mouth daily.  . metFORMIN (GLUMETZA) 1000 MG (MOD) 24 hr tablet Take 1 tablet (1,000 mg total) by mouth daily with breakfast.  . metoprolol succinate (TOPROL-XL) 50 MG 24 hr tablet Take 1 tablet (50 mg total) by mouth daily. Take with or immediately following a meal.  . nitroGLYCERIN (NITROSTAT) 0.4 MG SL tablet Place 0.4 mg under the tongue every 5 (five) minutes as needed for chest pain.  . rosuvastatin (CRESTOR) 20 MG tablet Take 20 mg by mouth daily.  . tamsulosin (FLOMAX) 0.4 MG CAPS capsule Take 0.4 mg by mouth daily.  [name] PEG-KCl-NaCl-NaSulf-Na Asc-C (PLENVU) 140 g SOLR Take 1 kit by mouth as directed.   No facility-administered encounter medications on file as of 11/12/2019.   No Known Allergies Patient Active Problem List   Diagnosis Date Noted  . Positive colorectal  cancer screening using Cologuard test 09/21/2019  . Type 2 diabetes mellitus ([place]) 06/15/2019  . Nonrheumatic aortic valve insufficiency 03/02/2019  . Shoulder impingement syndrome, right 10/11/2018  . Coronary artery disease 07/17/2018  . Alcohol use 04/24/2018  . Gastroesophageal reflux disease 10/02/2017  . Esophageal dysphagia 11/20/2016  . Corn of foot 09/19/2016  . Marijuana use 06/27/2016  . Left shoulder pain 05/30/2015  . Discomfort in chest 01/26/2015  . Cataract, left eye 11/03/2014  . Gastritis 02/11/2014  . Impaired fasting glucose 10/19/2013  . Substance abuse ([place]) 09/04/2011  . Erectile dysfunction 09/04/2011  . Hemoptysis 03/29/2011  . Colon cancer screening 03/29/2011  . Prostate cancer screening 03/29/2011  . DOE (dyspnea on exertion) 11/28/2009  . Mixed hyperlipidemia 04/04/2009  . Benign prostatic hyperplasia 04/04/2009  . Essential hypertension 01/23/2009  . NOCTURIA 01/05/2009  . Essential hypertension, benign 01/05/2009   Social History   Socioeconomic History  . Marital status: Single    Spouse name: Not on file  . Number of children: 3  . Years of education: Not on file  . Highest education level: Not on file  Occupational History  . Not on file  Tobacco Use  . Smoking status: Never Smoker  . Smokeless tobacco: Never Used  Vaping Use  . Vaping Use: Never used  Substance and Sexual Activity  . Alcohol use: Yes    Comment: beer or vodka occasionally  . Drug use: Yes    Types: Marijuana  Comment: 3-6 X per week  . Sexual activity: Yes  Other Topics Concern  . Not on file  Social History Narrative   He is a widowed father of 58, grandfather of 3. He lives with his current girlfriend. 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   Social  Determinants of Health   Financial Resource Strain:   . Difficulty of Paying Living Expenses: Not on file  Food Insecurity:   . Worried About Charity fundraiser in the Last Year: Not on file  . Ran Out of Food in the Last Year: Not on file  Transportation Needs:   . Lack of Transportation (Medical): Not on file  . Lack of Transportation (Non-Medical): Not on file  Physical Activity:   . Days of Exercise per Week: Not on file  . Minutes of Exercise per Session: Not on file  Stress:   . Feeling of Stress : Not on file  Social Connections:   . Frequency of Communication with Friends and Family: Not on file  . Frequency of Social Gatherings with Friends and Family: Not on file  . Attends Religious Services: Not on file  . Active Member of Clubs or Organizations: Not on file  . Attends Archivist Meetings: Not on file  . Marital Status: Not on file  Intimate Partner Violence:   . Fear of Current or Ex-Partner: Not on file  . Emotionally Abused: Not on file  . Physically Abused: Not on file  . Sexually Abused: Not on file    Adam Burton's family history includes Diabetes in his maternal aunt, maternal grandmother, mother, and sister; Heart attack in his maternal grandmother and mother.      Objective:    Vitals:   11/12/19 1434  BP: 118/74  Pulse: 64    Physical Exam Well-developed well-nourished  Older AA male in no acute distress.  Height, Weight,172 BMI23.9  HEENT; nontraumatic normocephalic, EOMI, PER R LA, sclera anicteric. Oropharynx; not examined Neck; supple, no JVD Cardiovascular; regular rate and rhythm with S1-S2, no murmur rub or gallop Pulmonary; Clear bilaterally Abdomen; soft, nontender, nondistended, no palpable mass or hepatosplenomegaly, bowel sounds are active Rectal; not done today Skin; benign exam, no jaundice rash or appreciable lesions Extremities; no clubbing cyanosis or edema skin warm and dry Neuro/Psych; alert and oriented x4,  grossly nonfocal mood and affect appropriate       Assessment & Plan:   #13 69 year old male with positive Cologuard, asymptomatic, no prior colonoscopy Rule out occult colon neoplasm  #2 adult onset diabetes mellitus #3.  Hypertension #4.  Coronary artery disease status post prior stent, no current antiplatelet therapy #5 GERD-no recent issues and not on any medication  Plan; patient will be scheduled for Colonoscopy with Dr. Loletha Carrow.  Procedure was discussed in detail with the patient including indications risks and benefits and he is agreeable to proceed. Patient has completed COVID-19 vaccination.   Adam Burton Genia Harold PA-C 11/12/2019   Cc: [name], MD

## 2019-11-12 NOTE — Patient Instructions (Addendum)
If you are age 71 or older, your body mass index should be between 23-30. Your Body mass index is 23.99 kg/m. If this is out of the aforementioned range listed, please consider follow up with your Primary Care Provider.  If you are age 3 or younger, your body mass index should be between 19-25. Your Body mass index is 23.99 kg/m. If this is out of the aformentioned range listed, please consider follow up with your Primary Care Provider.   You have been scheduled for a colonoscopy. Please follow written instructions given to you at your visit today.  Please pick up your prep supplies at the pharmacy within the next 1-3 days. If you use inhalers (even only as needed), please bring them with you on the day of your procedure.  Follow up pending the results of your colonoscopy.

## 2019-11-15 NOTE — Telephone Encounter (Signed)
Called and spoke with patient. Compared schedules with him to see when we could get him in - unfortunately the first day that worked for both of [place] is Oct 19. Offered appointment with another physician sooner if he would like to be seen sooner than that but he prefers to see me. Given the tingling is intermittent and ongoing x2 months, doubt it needs urgent evaluation anyways. Scheduled for 10/19 at 10:30am. Patient appreciative.  [name], MD

## 2019-11-16 NOTE — Progress Notes (Signed)
____________________________________________________________  Attending physician addendum:  Thank you for sending this case to me. I have reviewed the entire note, and the outlined plan seems appropriate.  Henry Danis, MD  ____________________________________________________________  

## 2019-11-18 ENCOUNTER — Encounter: Payer: Medicare Other | Admitting: Gastroenterology

## 2019-12-14 ENCOUNTER — Encounter: Payer: Self-pay | Admitting: Family Medicine

## 2019-12-14 ENCOUNTER — Other Ambulatory Visit: Payer: Self-pay

## 2019-12-14 ENCOUNTER — Ambulatory Visit (INDEPENDENT_AMBULATORY_CARE_PROVIDER_SITE_OTHER): Payer: Medicare Other | Admitting: Family Medicine

## 2019-12-14 VITALS — BP 122/80 | HR 69 | Ht 71.0 in | Wt 180.6 lb

## 2019-12-14 DIAGNOSIS — R55 Syncope and collapse: Secondary | ICD-10-CM

## 2019-12-14 DIAGNOSIS — S46812A Strain of other muscles, fascia and tendons at shoulder and upper arm level, left arm, initial encounter: Secondary | ICD-10-CM | POA: Diagnosis not present

## 2019-12-14 DIAGNOSIS — Z789 Other specified health status: Secondary | ICD-10-CM

## 2019-12-14 DIAGNOSIS — Z7289 Other problems related to lifestyle: Secondary | ICD-10-CM | POA: Diagnosis not present

## 2019-12-14 NOTE — Progress Notes (Addendum)
SUBJECTIVE:   CHIEF COMPLAINT / HPI:   #Left Neck Tightness/Tingling: #Transient Loss of Consciousness: Adam Burton is a 69yo male with a history of HTN, CAD, DM2, and nonrheumatic aortic valve insufficiency who presents with "tingling" and stiffness of his L neck/shoulder. He reports that about two months ago, he "had one too many" drinks with dinner and had a transient loss of consciousness where he fell headfirst into a wall shortly after standing up from his chair. He reports a loss of consciousness for "five or six seconds." Says he had four drinks, but they were "stiff." Since then, he has had tightness and tingling in his neck and L shoulder, but it has been progressively improving. He has no change in sensation and reports full strength and range of motion in his shoulder/arm. He reports two prior episodes of "blacking out" and falling while drinking heavily several years ago. He has never lost consciousness when he was not drinking. Denies any other episodes of lightheadedness upon standing, palpitations, or chest pain. He is scheduled to follow-up with his cardiologist in January.  #Heavy etOH use On CAGE questioning, he reports that he feels in control of his drinking and does not have cravings for alcohol. Reports that he drinks 3 drinks twice per week. Says he used to drink more heavily and more often when he worked in [employer]   PERTINENT  PMH / PSH: Significant cardiac history, follows with cardiology. Last echo 10 mos ago, EF 41-66%, Grade I diastolic dysfunction and grade II aortic regurgitation. Stress test in February low risk.   OBJECTIVE:   BP 122/80   Pulse 69   Ht 5\' 11"  (1.803 m)   Wt 180 lb 9.6 oz (81.9 kg)   SpO2 98%   BMI 25.19 kg/m    Gen: no acute distress, pleasant cooperative HEENT: normocephalic, atraumatic. Full range of motion of neck in all directions. Mild tenderness of L trapezius. Full active ROM of L shoulder, grip 5/5 bilaterally, full strength  with shoulder abduction and adduction. Heart: regular rate and rhythm, no murmur Lungs: clear to auscultation bilaterally, normal work of breathing   ASSESSMENT/PLAN:   Left Neck Tightness/Tingling: Tightness and tingling much improved after mechanical injury two months ago without radiculopathy or change in sensation in distal arm. Expect that this is due to strain of trapezius muscle with fall two months ago. Expect that this will be self-resolving.  - No further work-up or intervention necessary at this time - trial of heating pad/ice pack as needed   Syncope: Unwitnessed LOC with fall in the setting of heavy alcohol use in a patient with significant cardiac history. Given his history of heavy drinking and prior episodes of etOH-related LOC, it is most likely that this was caused by his drinking that night and was likely orthostatic in nature. His recent echo, stress test, and lack of palpitations or chest pain are reassuring that this is not cardiac in etiology. However, would like for him to make his cardiologist aware of the episode. - Asked patient to call cardiology and make them aware of the episode - Encouraged moderation with etOH use - specifically no more than 2 drinks at a time.  Heavy etOH Use: Patient reports drinking 3 mixed drinks twice per week, but acknowledges that these typically include more alcohol than a standard drink. CAGE questionnaire negative for alcohol use disorder, but given this recent episode, he would benefit from cutting back which he is agreeable to do. - Encouraged him  to limit drinks to 2 per day.   Pearla Dubonnet, [place]   Patient seen along with MS3 student Pearla Dubonnet. I personally evaluated this patient along with the student, and verified all aspects of the history, physical exam, and medical decision making as documented by the student. I agree with the student's documentation and have made all  necessary edits.  Chrisandra Netters, MD  [place]

## 2019-12-14 NOTE — Progress Notes (Signed)
error 

## 2019-12-14 NOTE — Patient Instructions (Signed)
It was great to see you again today!  Call your cardiologist and see if they want to see you sooner for this episode.  Recommend no more than 1-2 drinks at a time.  Get your Tetanus shot at pharmacy  Follow up with me in 3 months, will check A1c then.  Be well, Dr. Ardelia Mems

## 2019-12-15 ENCOUNTER — Other Ambulatory Visit: Payer: Self-pay | Admitting: Family Medicine

## 2019-12-16 ENCOUNTER — Other Ambulatory Visit: Payer: Self-pay | Admitting: Family Medicine

## 2019-12-16 MED ORDER — METFORMIN HCL ER (MOD) 1000 MG PO TB24
1000.0000 mg | ORAL_TABLET | Freq: Every day | ORAL | 3 refills | Status: DC
Start: 2019-12-16 — End: 2019-12-20

## 2019-12-20 ENCOUNTER — Other Ambulatory Visit: Payer: Self-pay | Admitting: *Deleted

## 2019-12-20 MED ORDER — METFORMIN HCL ER (MOD) 1000 MG PO TB24
1000.0000 mg | ORAL_TABLET | Freq: Every day | ORAL | 3 refills | Status: DC
Start: 2019-12-20 — End: 2020-02-25

## 2019-12-29 ENCOUNTER — Telehealth: Payer: Self-pay

## 2019-12-29 NOTE — Telephone Encounter (Signed)
Received fax from pharmacy, PA needed on Metformin. Clinical questions submitted via Cover My Meds. Waiting on response, could take up to 72 hours.  Cover My Meds info: Key: [id number]

## 2020-01-05 ENCOUNTER — Encounter: Payer: Medicare Other | Admitting: Gastroenterology

## 2020-01-12 ENCOUNTER — Other Ambulatory Visit: Payer: Self-pay

## 2020-01-12 ENCOUNTER — Ambulatory Visit (AMBULATORY_SURGERY_CENTER): Payer: Self-pay | Admitting: *Deleted

## 2020-01-12 VITALS — Ht 71.0 in | Wt 181.0 lb

## 2020-01-12 DIAGNOSIS — R195 Other fecal abnormalities: Secondary | ICD-10-CM

## 2020-01-12 NOTE — Progress Notes (Signed)

## 2020-01-12 NOTE — Progress Notes (Signed)
Plenvu sample proved.LOT [id number] Patient reports insurance previously denied this med when he was previous scheduled for colonoscopy.

## 2020-01-13 ENCOUNTER — Encounter: Payer: Self-pay | Admitting: Gastroenterology

## 2020-01-23 ENCOUNTER — Other Ambulatory Visit: Payer: Self-pay | Admitting: Cardiology

## 2020-01-26 ENCOUNTER — Encounter: Payer: Self-pay | Admitting: Gastroenterology

## 2020-01-26 ENCOUNTER — Ambulatory Visit (AMBULATORY_SURGERY_CENTER): Payer: Medicare Other | Admitting: Gastroenterology

## 2020-01-26 ENCOUNTER — Other Ambulatory Visit: Payer: Self-pay

## 2020-01-26 VITALS — BP 124/84 | HR 91 | Temp 96.8°F | Resp 23 | Ht 71.0 in | Wt 181.0 lb

## 2020-01-26 DIAGNOSIS — D123 Benign neoplasm of transverse colon: Secondary | ICD-10-CM

## 2020-01-26 DIAGNOSIS — R195 Other fecal abnormalities: Secondary | ICD-10-CM

## 2020-01-26 MED ORDER — DEXTROSE 5 % IV SOLN: INTRAVENOUS | Status: DC

## 2020-01-26 MED ORDER — SODIUM CHLORIDE 0.9 % IV SOLN: 500.0000 mL | Freq: Once | INTRAVENOUS | Status: DC

## 2020-01-26 NOTE — Patient Instructions (Signed)
Handout given for polyps.  You don't need another colonoscopy due to your age.  YOU HAD AN ENDOSCOPIC PROCEDURE TODAY AT [facility]:   Refer to the procedure report that was given to you for any specific questions about what was found during the examination.  If the procedure report does not answer your questions, please call your gastroenterologist to clarify.  If you requested that your care partner not be given the details of your procedure findings, then the procedure report has been included in a sealed envelope for you to review at your convenience later.  YOU SHOULD EXPECT: Some feelings of bloating in the abdomen. Passage of more gas than usual.  Walking can help get rid of the air that was put into your GI tract during the procedure and reduce the bloating. If you had a lower endoscopy (such as a colonoscopy or flexible sigmoidoscopy) you may notice spotting of blood in your stool or on the toilet paper. If you underwent a bowel prep for your procedure, you may not have a normal bowel movement for a few days.  Please Note:  You might notice some irritation and congestion in your nose or some drainage.  This is from the oxygen used during your procedure.  There is no need for concern and it should clear up in a day or so.  SYMPTOMS TO REPORT IMMEDIATELY:   Following lower endoscopy (colonoscopy or flexible sigmoidoscopy):  Excessive amounts of blood in the stool  Significant tenderness or worsening of abdominal pains  Swelling of the abdomen that is new, acute  Fever of 100F or higher  For urgent or emergent issues, a gastroenterologist can be reached at any hour by calling [phone]. Do not use MyChart messaging for urgent concerns.    DIET:  We do recommend a small meal at first, but then you may proceed to your regular diet.  Drink plenty of fluids but you should avoid alcoholic beverages for 24 hours.  ACTIVITY:  You should plan to take it easy for the  rest of today and you should NOT DRIVE or use heavy machinery until tomorrow (because of the sedation medicines used during the test).    FOLLOW UP: Our staff will call the number listed on your records 48-72 hours following your procedure to check on you and address any questions or concerns that you may have regarding the information given to you following your procedure. If we do not reach you, we will leave a message.  We will attempt to reach you two times.  During this call, we will ask if you have developed any symptoms of COVID 19. If you develop any symptoms (ie: fever, flu-like symptoms, shortness of breath, cough etc.) before then, please call [phone].  If you test positive for Covid 19 in the 2 weeks post procedure, please call and report this information to [place].    If any biopsies were taken you will be contacted by phone or by letter within the next 1-3 weeks.  Please call us at [phone] if you have not heard about the biopsies in 3 weeks.    SIGNATURES/CONFIDENTIALITY: You and/or your care partner have signed paperwork which will be entered into your electronic medical record.  These signatures attest to the fact that that the information above on your After Visit Summary has been reviewed and is understood.  Full responsibility of the confidentiality of this discharge information lies with you and/or your care-partner.

## 2020-01-26 NOTE — Progress Notes (Signed)
A/ox3, pleased with MAC, report to RN 

## 2020-01-26 NOTE — Progress Notes (Signed)
VS- Riki Sheer Cell phone off per pt  Pt's blood sugar 70- pt states he doesn't check sugar often, so "not sure how it runs normally"  Dr.  Loletha Carrow and Osvaldo Angst CRNA aware- ok to hand D5 IVF  Last used marijuana "last week"

## 2020-01-26 NOTE — Op Note (Signed)
[place] Patient Name: Adam Burton Procedure Date: 01/26/2020 9:36 AM MRN: [id number] Endoscopist: Mallie Mussel L. Loletha Carrow , MD Age: 69 Referring MD:  Date of Birth: Jul 12, 1950 Gender: Male Account #: [id number] Procedure:                Colonoscopy Indications:              Positive Cologuard test Medicines:                Monitored Anesthesia Care Procedure:                Pre-Anesthesia Assessment:                           - Prior to the procedure, a History and Physical                            was performed, and patient medications and                            allergies were reviewed. The patient's tolerance of                            previous anesthesia was also reviewed. The risks                            and benefits of the procedure and the sedation                            options and risks were discussed with the patient.                            All questions were answered, and informed consent                            was obtained. Prior Anticoagulants: The patient has                            taken no previous anticoagulant or antiplatelet                            agents. ASA Grade Assessment: III - A patient with                            severe systemic disease. After reviewing the risks                            and benefits, the patient was deemed in                            satisfactory condition to undergo the procedure.                           After obtaining informed consent, the colonoscope  was passed under direct vision. Throughout the                            procedure, the patient's blood pressure, pulse, and                            oxygen saturations were monitored continuously. The                            Colonoscope was introduced through the anus and                            advanced to the the cecum, identified by                            appendiceal orifice and ileocecal valve. The                             colonoscopy was performed without difficulty. The                            patient tolerated the procedure well. The quality                            of the bowel preparation was excellent. The                            ileocecal valve, appendiceal orifice, and rectum                            were photographed. The bowel preparation used was                            Plenvu. Scope In: 9:45:09 AM Scope Out: 10:01:27 AM Scope Withdrawal Time: 0 hours 12 minutes 58 seconds  Total Procedure Duration: 0 hours 16 minutes 18 seconds  Findings:                 The digital rectal exam findings include enlarged                            prostate.                           A 6 mm polyp was found in the transverse colon. The                            polyp was sessile. The polyp was removed with a                            cold snare. Resection and retrieval were complete.                           The exam was otherwise without abnormality on  direct and retroflexion views. Complications:            No immediate complications. Estimated Blood Loss:     Estimated blood loss was minimal. Impression:               - Enlarged prostate found on digital rectal exam.                           - One 6 mm polyp in the transverse colon, removed                            with a cold snare. Resected and retrieved.                           - The examination was otherwise normal on direct                            and retroflexion views. Recommendation:           - Patient has a contact number available for                            emergencies. The signs and symptoms of potential                            delayed complications were discussed with the                            patient. Return to normal activities tomorrow.                            Written discharge instructions were provided to the                            patient.                            - Resume previous diet.                           - Continue present medications.                           - Await pathology results.                           - Based on age and current guidelines, no repeat                            surveillance colonoscopy recommended. Loni Delbridge L. Loletha Carrow, MD 01/26/2020 10:15:57 AM This report has been signed electronically.

## 2020-01-26 NOTE — Progress Notes (Signed)
Called to room to assist during endoscopic procedure.  Patient ID and intended procedure confirmed with present staff. Received instructions for my participation in the procedure from the performing physician.  

## 2020-01-28 ENCOUNTER — Telehealth: Payer: Self-pay | Admitting: *Deleted

## 2020-01-28 NOTE — Telephone Encounter (Signed)
  Follow up Call-  Call back number 01/26/2020  Post procedure Call Back phone  # [phone]  Permission to leave phone message Yes  Some recent data might be hidden     Patient questions:  Do you have a fever, pain , or abdominal swelling? No. Pain Score  0 *  Have you tolerated food without any problems? Yes.    Have you been able to return to your normal activities? Yes.    Do you have any questions about your discharge instructions: Diet   No. Medications  No. Follow up visit  No.  Do you have questions or concerns about your Care? No.  Actions: * If pain score is 4 or above: 1. No action needed, pain <4.Have you developed a fever since your procedure? no  2.   Have you had an respiratory symptoms (SOB or cough) since your procedure? no  3.   Have you tested positive for COVID 19 since your procedure no  4.   Have you had any family members/close contacts diagnosed with the COVID 19 since your procedure?  no   If yes to any of these questions please route to Joylene John, RN and Joella Prince, RN

## 2020-02-01 ENCOUNTER — Encounter: Payer: Self-pay | Admitting: Gastroenterology

## 2020-02-14 ENCOUNTER — Other Ambulatory Visit: Payer: Medicare Other

## 2020-02-24 ENCOUNTER — Other Ambulatory Visit: Payer: Self-pay | Admitting: *Deleted

## 2020-02-24 ENCOUNTER — Other Ambulatory Visit: Payer: Self-pay

## 2020-02-24 ENCOUNTER — Other Ambulatory Visit: Payer: Self-pay | Admitting: Family Medicine

## 2020-02-24 ENCOUNTER — Ambulatory Visit: Payer: Medicare Other

## 2020-02-24 DIAGNOSIS — I351 Nonrheumatic aortic (valve) insufficiency: Secondary | ICD-10-CM

## 2020-02-25 NOTE — Telephone Encounter (Signed)
Called patient to discuss since I got two different metformin requests He confirms he takes two of the 500mg  tablets Declines flu shot at this time but will contemplate it Reminded him of getting Tdap - he says he will work on this Also scheduled follow up visit for him in January to update A1c  Patient appreciative. February, MD

## 2020-03-02 NOTE — Progress Notes (Signed)
Follow up visit  Subjective:   Adam Burton, male    DOB: [birth year], 70 y.o.   MRN: [id number]   Chief Complaint  Patient presents with  . Follow-up    6 month  . Results    70 y/o African AmericanmaleCAD s/p prox-mid LAD 07/2017, hypertension, GERD, rotator cuff injury.  Patient is doing well. He has not been doing any significant physical activity due to cold weather. He tells me he is "from the [place] and does not like cold weather. Albeit with limited physical activity, he denies chest pain, shortness of breath, palpitations, leg edema, orthopnea, PND, TIA/syncope. Blood pressure is elevated today. He does not check it regularly at home.   Current Outpatient Medications on File Prior to Visit  Medication Sig Dispense Refill  . metFORMIN (GLUCOPHAGE-XR) 500 MG 24 hr tablet Take 2 tablets (1,000 mg total) by mouth daily with breakfast. 180 tablet 1  . acetaminophen (TYLENOL) 500 MG tablet Take 2 tablets (1,000 mg total) by mouth every 8 (eight) hours as needed for moderate pain. 30 tablet 0  . aspirin EC 81 MG tablet Take 81 mg by mouth daily.    . metoprolol succinate (TOPROL-XL) 50 MG 24 hr tablet Take 1 tablet (50 mg total) by mouth daily. Take with or immediately following a meal. 90 tablet 3  . nitroGLYCERIN (NITROSTAT) 0.4 MG SL tablet Place 0.4 mg under the tongue every 5 (five) minutes as needed for chest pain. (Patient not taking: Reported on 01/26/2020)    . rosuvastatin (CRESTOR) 20 MG tablet Take 1 tablet by mouth daily 90 tablet 0  . tamsulosin (FLOMAX) 0.4 MG CAPS capsule Take 0.4 mg by mouth daily.     Current Facility-Administered Medications on File Prior to Visit  Medication Dose Route Frequency Provider Last Rate Last Admin  . dextrose 5 % solution   Intravenous Continuous Doran Stabler, MD        Cardiovascular studies:  EKG 03/04/2019: Sinus rhythm 58 bpm Possible old anteroseptal infarct.  Inferolateral T wave inversion, consider  ischemia. No significant change compared to previous EKG's  Echocardiogram 02/24/2020:  Left ventricle cavity is normal in size and wall thickness. Normal global  wall motion. Normal LV systolic function with EF 61%. Doppler evidence of  grade I (impaired) diastolic dysfunction, normal LAP.  Mild (Grade I) aortic regurgitation.  Normal right atrial pressure.  Compared to previous study on 02/23/2019, AI reduced from grade II to  grade I. MR is now absent.   Lexiscan (Walking with mod Bruce)Tetrofosmin Stress Test  04/05/2019: Nondiagnostic ECG stress. Perfusion images revealed diaphragmatic attenuation artifact in the inferior wall without evidence of ischemia or scar.  The left ventricle is mildly dilated.  There was no regional wall motion abnormality.  Mild global hypokinesis.  Calculated LVEF moderately reduced at 39%, visually EF appears to be 45%.  Low risk study.   Compared 12/08/2017, no significant change in perfusion, however there was significant EKG abnormalities noted associated with chest pain on the treadmill stress test.  Clinical correlation recommended. Low risk study.  Coronary angiography and intervention 08/04/2018: LM: Normal LAD: Prox-mid 75% stenosis. CTA FFR 0.75.         Diag 1 small vessel with 70% disease LCx: Normal RCA: Prox 60% stenosis. CTA FFR 0.85  Successful percutaneous coronary intervention prox-mid LAD PTCA and stent placement Resolute Onyx 3.5 X 22 mm drug-eluting stent Recommend medical management for small vessel diag disease, and RCA nonobstructive disease.  Recent labs: 06/15/2019: Glucose 105, BUN/Cr 19/1.41. EGFR 58. Na/K 137/4.3. Rest of the CMP normal H/H 14/44. MCV 94. Platelets 347 HbA1C 6.4%   Review of Systems  Cardiovascular: Negative for chest pain, dyspnea on exertion, leg swelling, palpitations and syncope.       Vitals:   03/03/20 0828  BP: (!) 146/90  Pulse: 66  Resp: 16  SpO2: 98%     Body mass index is 25.36  kg/m. Filed Weights   03/03/20 0828  Weight: 181 lb 12.8 oz (82.5 kg)     Objective:   Physical Exam Vitals and nursing note reviewed.  Constitutional:      Appearance: He is well-developed.  Neck:     Vascular: No JVD.  Cardiovascular:     Rate and Rhythm: Normal rate and regular rhythm.     Pulses: Intact distal pulses.     Heart sounds: Normal heart sounds. No murmur heard.   Pulmonary:     Effort: Pulmonary effort is normal.     Breath sounds: Normal breath sounds. No wheezing or rales.           Assessment & Recommendations:   70 y/o African AmericanmaleCAD, hypertension, GERD, rotator cuff injury.  Coronary artery disease involving native coronary artery of native heart without angina pectoris: Pro-mid LAD stenting Resolute Onyx 3.5 X 22 mm (07/2018) 70% diag 1 and 60% prox RCA disease (dFR 0.85) No angina symptoms. Continue aspirin, statin, metoprolol succinate to 50 mg daily.  Lipids well controlled  Primary hypertension: Elevated today. Patient will keep a log of his BP readings at home and contact me in 1 month.  If remains >130/80 mmHg, will add amlodipine 5 mg.  Mixed hyperlipidemia Well controlled  Nonrheumatic aortic valve insufficiency: Mild, stable, asymptomatic.  F/u in 1 year    Nigel Mormon, MD [facility] Pager: [phone] Office: [phone] If no answer Cell [phone]

## 2020-03-03 ENCOUNTER — Other Ambulatory Visit: Payer: Self-pay

## 2020-03-03 ENCOUNTER — Encounter: Payer: Self-pay | Admitting: Cardiology

## 2020-03-03 ENCOUNTER — Ambulatory Visit: Payer: Medicare Other | Admitting: Cardiology

## 2020-03-03 VITALS — BP 146/90 | HR 66 | Resp 16 | Ht 71.0 in | Wt 181.8 lb

## 2020-03-03 DIAGNOSIS — I2510 Atherosclerotic heart disease of native coronary artery without angina pectoris: Secondary | ICD-10-CM | POA: Diagnosis not present

## 2020-03-03 DIAGNOSIS — I10 Essential (primary) hypertension: Secondary | ICD-10-CM

## 2020-03-03 DIAGNOSIS — I351 Nonrheumatic aortic (valve) insufficiency: Secondary | ICD-10-CM | POA: Diagnosis not present

## 2020-03-03 DIAGNOSIS — E782 Mixed hyperlipidemia: Secondary | ICD-10-CM | POA: Diagnosis not present

## 2020-03-03 MED ORDER — METOPROLOL SUCCINATE ER 50 MG PO TB24: 50.0000 mg | ORAL_TABLET | Freq: Every day | ORAL | 3 refills | Status: DC

## 2020-03-03 MED ORDER — ASPIRIN EC 81 MG PO TBEC: 81.0000 mg | DELAYED_RELEASE_TABLET | Freq: Every day | ORAL | 3 refills | Status: AC

## 2020-03-03 MED ORDER — ROSUVASTATIN CALCIUM 20 MG PO TABS: 20.0000 mg | ORAL_TABLET | Freq: Every day | ORAL | 3 refills | Status: DC

## 2020-03-03 MED ORDER — NITROGLYCERIN 0.4 MG SL SUBL: 0.4000 mg | SUBLINGUAL_TABLET | SUBLINGUAL | 3 refills | Status: AC | PRN

## 2020-03-16 ENCOUNTER — Other Ambulatory Visit: Payer: Self-pay

## 2020-03-16 ENCOUNTER — Ambulatory Visit (INDEPENDENT_AMBULATORY_CARE_PROVIDER_SITE_OTHER): Payer: Medicare Other | Admitting: Family Medicine

## 2020-03-16 VITALS — BP 124/80 | HR 81 | Wt 180.0 lb

## 2020-03-16 DIAGNOSIS — E119 Type 2 diabetes mellitus without complications: Secondary | ICD-10-CM | POA: Diagnosis not present

## 2020-03-16 DIAGNOSIS — I10 Essential (primary) hypertension: Secondary | ICD-10-CM | POA: Diagnosis not present

## 2020-03-16 DIAGNOSIS — E782 Mixed hyperlipidemia: Secondary | ICD-10-CM

## 2020-03-16 LAB — POCT GLYCOSYLATED HEMOGLOBIN (HGB A1C): Hemoglobin A1C: 6.4 % — AB (ref 4.0–5.6)

## 2020-03-16 MED ORDER — SHINGRIX 50 MCG/0.5ML IM SUSR: 0.5000 mL | Freq: Once | INTRAMUSCULAR | 1 refills | Status: AC

## 2020-03-16 NOTE — Assessment & Plan Note (Addendum)
Well controlled. Continue current medication regimen. A1c today 6.4. no changes to medications. Follow up in 6 months. Encouraged exercise.

## 2020-03-16 NOTE — Assessment & Plan Note (Signed)
Well controlled. Continue current medication regimen.  

## 2020-03-16 NOTE — Assessment & Plan Note (Signed)
Well controlled on last check with excellent LDL. Continue rosuvastatin. Will plan to recheck lipids at next visit in 6 months

## 2020-03-16 NOTE — Patient Instructions (Addendum)
It was great to see you again today!  A1c is great today Stay on current medications  Get your tetanus and shingles shots at your pharmacy - let me know when you've had them  Follow up with me in 6 months, sooner if needed   Be well, Dr. Ardelia Mems

## 2020-03-16 NOTE — Progress Notes (Signed)
  Date of Visit: 03/16/2020   SUBJECTIVE:   HPI:  Adam Burton presents today for routine follow up.  Diabetes - taking metformin xr 1000mg  daily (takes two 500mg  tablets). Tolerating well without issue.  Hyperlipidemia - taking rosuvastatin 20mg  daily, tolerating well.  Hypertension - taking metoprolol XL 50mg  daily.  Vaccines - has not yet gotten Tdap, also not had shingrix   OBJECTIVE:   BP 124/80   Pulse 81   Wt 180 lb (81.6 kg)   SpO2 99%   BMI 25.10 kg/m  Gen: no acute distress, pleasant, cooperative HEENT: normocephalic, atraumatic  Heart: regular rate and rhythm, no murmur Lungs: clear to auscultation bilaterally, normal work of breathing  Neuro: alert, speech normal, grossly nonfocal  ASSESSMENT/PLAN:   Health maintenance:  -advised to get Tdap -current on COVID vaccines -given rx for shingrix to get at his pharmacy  Essential hypertension, benign Well controlled. Continue current medication regimen.   Type 2 diabetes mellitus (HCC) Well controlled. Continue current medication regimen. A1c today 6.4. no changes to medications. Follow up in 6 months. Encouraged exercise.  Mixed hyperlipidemia Well controlled on last check with excellent LDL. Continue rosuvastatin. Will plan to recheck lipids at next visit in 6 months   FOLLOW UP: Follow up in 6 mos for above issues  [place] J. Ardelia Mems, [place]

## 2020-04-19 ENCOUNTER — Telehealth: Payer: Self-pay

## 2020-04-19 DIAGNOSIS — I2510 Atherosclerotic heart disease of native coronary artery without angina pectoris: Secondary | ICD-10-CM

## 2020-04-19 NOTE — Telephone Encounter (Signed)
Placed referral to [facility] on [address]. Please let patient know he should hear from their office about scheduling an appointment  [name], MD

## 2020-04-19 NOTE — Telephone Encounter (Signed)
Patient calls nurse line requesting to speak with Dr. Ardelia Mems regarding a new cardiologist. Patient would like to establish care with a [facility] cardiologist. Please advise if referral can be placed for patient at the [facility] office.   To PCP  Talbot Grumbling, RN

## 2020-04-20 NOTE — Telephone Encounter (Signed)
Called patient concerning referral for [place].  Gave patient number to [facility] office in case he does not hear from them in a timely manner.   [address] [phone]  Patient verbalized understanding.  Ozella Almond, [place]

## 2020-04-22 NOTE — Progress Notes (Signed)
Cardiology Office Note:    Date:  04/24/2020   ID:  Adam [facility], DOB [birth year], MRN [id number]  PCP:  Adam Rio, MD   [place]  Cardiologist:  No primary care provider on file.  Advanced Practice Provider:  No care team member to display Electrophysiologist:  None    Referring MD: Adam Rio, MD    History of Present Illness:    Adam Burton is a 70 y.o. male with a hx of CAD s/p prox-mid LAD DES in 07/2017, HTN, HLD, DMII who was referred by Dr. Ardelia [facility] for further management of known CAD.  Patient was previously followed by Dr Adam [facility]. During his last visit on 03/03/20, he was doing well. TTE with normal LVEF, G1DD, mild AR. Lexiscan in 02/21 without diaphragmatic attenuation but no ischemia. EF was called low but TTE in 2021 with EF 61%. He was continued on ASA, statin and metop. He now presents for follow-up.  The patient states that he is having some chest pressure in the right and left side of the chest. Usually occurs after he eats or drinks anything. Not occurring with exertion. Has some SOB with talking but not with activity. Patient is able to walk 2-3 miles without issues. Blood pressure has been fluctuating but mainly running 150s at home. Cooks all his meals and has been taking his medications as prescribed. No orthopnea, LE edema, nausea, vomiting, palpitations, lightheadedness or dizziness.  Past Medical History:  Diagnosis Date  . Diabetes mellitus without complication ([place])   . Hypercholesteremia   . Hypertension   . Renal insufficiency 08/21/2012    Past Surgical History:  Procedure Laterality Date  . CARDIAC CATHETERIZATION  01/2009   Dr. Martinique. Normal coronary arteries. Normal LV function.  . CORONARY ANGIOGRAPHY N/A 08/04/2018   Procedure: CORONARY ANGIOGRAPHY (CATH LAB);  Surgeon: Adam Mormon, MD;  Location: [facility];  Service: Cardiovascular;  Laterality: N/A;  . CORONARY STENT  INTERVENTION N/A 08/04/2018   Procedure: CORONARY STENT INTERVENTION;  Surgeon: Adam Mormon, MD;  Location: [facility];  Service: Cardiovascular;  Laterality: N/A;  . HERNIA REPAIR    . NM MYOVIEW LTD  12/2008   Potential small area of mild ischemia in the basal inferoseptal wall. Otherwise normal. --> False positive test based on coronary angiography  . Stress Echocardiogram  12/2013   Exercise for just over 4 minutes. Heart rate increased to 148 BPM. Hypertensive response to exercise. EF 60%. No evidence of ischemia. NORMAL STUDY  . TRANSTHORACIC ECHOCARDIOGRAM  11/2013   EF 55-60%. No regional wall motion abnormality is. Normal thickness. Mild LA dilation.    Current Medications: Current Meds  Medication Sig  . acetaminophen (TYLENOL) 500 MG tablet Take 2 tablets (1,000 mg total) by mouth every 8 (eight) hours as needed for moderate pain.  [name] aspirin EC 81 MG tablet Take 1 tablet (81 mg total) by mouth daily.  [name] losartan (COZAAR) 50 MG tablet Take 1 tablet (50 mg total) by mouth daily.  . metFORMIN (GLUCOPHAGE-XR) 500 MG 24 hr tablet Take 2 tablets (1,000 mg total) by mouth daily with breakfast.  . metoprolol succinate (TOPROL-XL) 50 MG 24 hr tablet Take 1 tablet (50 mg total) by mouth daily. Take with or immediately following a meal.  . nitroGLYCERIN (NITROSTAT) 0.4 MG SL tablet Place 1 tablet (0.4 mg total) under the tongue every 5 (five) minutes as needed for chest pain.  [name] oxybutynin (DITROPAN-XL) 5 MG 24  hr tablet Take 5 mg by mouth at bedtime.  . rosuvastatin (CRESTOR) 20 MG tablet Take 1 tablet (20 mg total) by mouth daily.  . tamsulosin (FLOMAX) 0.4 MG CAPS capsule Take 0.4 mg by mouth daily.     Allergies:   Patient has no known allergies.   Social History   Socioeconomic History  . Marital status: Single    Spouse name: Not on file  . Number of children: 3  . Years of education: Not on file  . Highest education level: Not on file  Occupational History  .  Not on file  Tobacco Use  . Smoking status: Never Smoker  . Smokeless tobacco: Never Used  Vaping Use  . Vaping Use: Never used  Substance and Sexual Activity  . Alcohol use: Yes    Comment: beer or vodka occasionally  . Drug use: Yes    Types: Marijuana    Comment: 3-6 X per week  . Sexual activity: Yes  Other Topics Concern  . Not on file  Social History Narrative   He is a widowed father of 16, grandfather of 3. He lives with his current girlfriend. 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   Social Determinants of Health   Financial Resource Strain: Not on file  Food Insecurity: Not on file  Transportation Needs: Not on file  Physical Activity: Not on file  Stress: Not on file  Social Connections: Not on file     Family History: The patient's family history includes Diabetes in his maternal aunt, maternal grandmother, mother, and sister; Heart attack in his maternal grandmother and mother. There is no history of Colon cancer, Colon polyps, Esophageal cancer, Rectal cancer, or Stomach cancer.  ROS:   Please see the history of present illness.    Review of Systems  Constitutional: Negative for chills and fever.  HENT: Negative for hearing loss.   Eyes: Negative for blurred vision and redness.  Respiratory: Negative for shortness of breath.   Cardiovascular: Positive for chest pain. Negative for palpitations, orthopnea, claudication, leg swelling and PND.  Gastrointestinal: Negative for melena, nausea and vomiting.  Genitourinary: Negative for dysuria.  Musculoskeletal: Negative for falls.  Neurological: Negative for dizziness and loss of consciousness.  Endo/Heme/Allergies: Negative for polydipsia.  Psychiatric/Behavioral: Negative for substance abuse.    EKGs/Labs/Other  Studies Reviewed:    The following studies were reviewed today: Echocardiogram 02/24/2020:  Left ventricle cavity is normal in size and wall thickness. Normal global  wall motion. Normal LV systolic function with EF 61%. Doppler evidence of  grade I (impaired) diastolic dysfunction, normal LAP.  Mild (Grade I) aortic regurgitation.  Normal right atrial pressure.  Compared to previous study on 02/23/2019, AI reduced from grade II to  grade I. MR is now absent.   Lexiscan (Walking with mod Bruce)Tetrofosmin Stress Test 04/05/2019: Nondiagnostic ECG stress. Perfusion images revealed diaphragmatic attenuation artifact in the inferior wall without evidence of ischemia or scar. The left ventricle is mildly dilated. There was no regional wall motion abnormality. Mild global hypokinesis. Calculated LVEF moderately reduced at 39%, visually EF appears to be 45%. Low risk study.  Compared 12/08/2017, no significant change in perfusion, however there was significant EKG abnormalities noted associated with chest pain on the treadmill stress test. Clinical  correlation recommended. Low risk study.  Coronary angiography and intervention 08/04/2018: LM: Normal LAD: Prox-mid 75% stenosis. CTA FFR 0.75. Diag 1 small vessel with 70% disease LCx: Normal RCA: Prox 60% stenosis. CTA FFR 0.85  Successful percutaneous coronary intervention prox-mid LAD PTCA and stent placement Resolute Onyx 3.5 X 22 mm drug-eluting stent Recommend medical management for small vessel diag disease, and RCA nonobstructive disease.   Recent Labs: 06/15/2019: BUN 19; Creatinine, Ser 1.41; Potassium 4.3; Sodium 137  Recent Lipid Panel    Component Value Date/Time   CHOL 127 06/15/2019 1228   TRIG 71 06/15/2019 1228   HDL 60 06/15/2019 1228   CHOLHDL 2.1 06/15/2019 1228   CHOLHDL 5.9 (H) 11/01/2014 1026   VLDL 23 11/01/2014 1026   LDLCALC 53 06/15/2019 1228   LDLDIRECT 84 09/03/2011 0930     Physical Exam:     VS:  BP (!) 178/84   Pulse 67   Ht 5\' 11"  (1.803 m)   Wt 180 lb 12.8 oz (82 kg)   SpO2 97%   BMI 25.22 kg/m     Wt Readings from Last 3 Encounters:  04/24/20 180 lb 12.8 oz (82 kg)  03/16/20 180 lb (81.6 kg)  03/03/20 181 lb 12.8 oz (82.5 kg)     GEN:  Well nourished, well developed in no acute distress HEENT: Normal NECK: No JVD; No carotid bruits CARDIAC: RRR, no murmurs, rubs, gallops RESPIRATORY:  Clear to auscultation without rales, wheezing or rhonchi  ABDOMEN: Soft, non-tender, non-distended MUSCULOSKELETAL:  No edema; No deformity  SKIN: Warm and dry NEUROLOGIC:  Alert and oriented x 3 PSYCHIATRIC:  Normal affect   ASSESSMENT:    1. Coronary artery disease involving native coronary artery of native heart without angina pectoris   2. Medication management   3. Mixed hyperlipidemia   4. Nonrheumatic aortic valve insufficiency   5. Essential hypertension   6. S/P primary angioplasty with coronary stent   7. Esophageal dysphagia    PLAN:    In order of problems listed above:  #Known CAD s/p prox-mid PCI of LAD: S/p DES with Resolute Onyx 3.5 X 22 mm (07/2018), 70% diag 1 and 60% prox RCA disease (dFR 0.85). Lexiscan in 03/2019 without ischemia. TTE in 01/2020 with LVEF 61%, G1 DD, mild AR -Continue ASA 81mg  daily -Continue Crestor 20mg  daily -Continue metop succinate 50mg  daily -Start losartan 50mg  daily -BMET next week  #Noncardiac Chest Pain: Patient with chest discomfort after eating/drinking. No exertional symptoms and patient able to walk 2-3 miles without issues. Myoview negative for ischemia. TTE with normal EF, no WMA. -Start nexium -Manage known CAD as above  #HTN: Elevated in the office at 170/80s today. Has been running 150s at home -Continue metop 50mg  daily -Start losartan 50mg  daily -BMET next week -Keep BP log and review in 2 weeks at follow-up  #HLD: Well controlled. LDL 53. -Continue crestor 20mg  daily  #DMII: -Continue  metformin  #Mild AR: -Monitor with serial TTEs     Medication Adjustments/Labs and Tests Ordered: Current medicines are reviewed at length with the patient today.  Concerns regarding medicines are outlined above.  Orders Placed This Encounter  Procedures  . Basic metabolic panel   Meds ordered this encounter  Medications  . losartan (COZAAR) 50 MG tablet    Sig: Take 1 tablet (50 mg total) by mouth daily.    Dispense:  90 tablet    Refill:  3    Patient Instructions  Medication Instructions:  Your physician  has recommended you make the following change in your medication:  1.  START Losartan 50 mg taking 1 daily   *If you need a refill on your cardiac medications before your next appointment, please call your pharmacy*   Lab Work: 05/02/20: COME TO THE OFFICE FOR LAB WORK.  ANYTIME AFTER 7:30-4:30:  BMET  If you have labs (blood work) drawn today and your tests are completely normal, you will receive your results only by: [name] MyChart Message (if you have MyChart) OR . A paper copy in the mail If you have any lab test that is abnormal or we need to change your treatment, we will call you to review the results.   Testing/Procedures: None ordered   Follow-Up: At [facility], you and your health needs are our priority.  As part of our continuing mission to provide you with exceptional heart care, we have created designated Provider Care Teams.  These Care Teams include your primary Cardiologist (physician) and Advanced Practice Providers (APPs -  Physician Assistants and Nurse Practitioners) who all work together to provide you with the care you need, when you need it.  We recommend signing up for the patient portal called "MyChart".  Sign up information is provided on this After Visit Summary.  MyChart is used to connect with patients for Virtual Visits (Telemedicine).  Patients are able to view lab/test results, encounter notes, upcoming appointments, etc.  Non-urgent  messages can be sent to your provider as well.   To learn more about what you can do with MyChart, go to [email or web address].    Your next appointment:   2 week(s)  The format for your next appointment:   In Person  Provider:   Gwyndolyn Kaufman, MD   Other Instructions      Signed, Freada Bergeron, MD  04/24/2020 10:08 AM    [place]

## 2020-04-24 ENCOUNTER — Ambulatory Visit (INDEPENDENT_AMBULATORY_CARE_PROVIDER_SITE_OTHER): Payer: Medicare Other | Admitting: Cardiology

## 2020-04-24 ENCOUNTER — Encounter: Payer: Self-pay | Admitting: Cardiology

## 2020-04-24 ENCOUNTER — Other Ambulatory Visit: Payer: Self-pay

## 2020-04-24 VITALS — BP 178/84 | HR 67 | Ht 71.0 in | Wt 180.8 lb

## 2020-04-24 DIAGNOSIS — I10 Essential (primary) hypertension: Secondary | ICD-10-CM

## 2020-04-24 DIAGNOSIS — Z955 Presence of coronary angioplasty implant and graft: Secondary | ICD-10-CM | POA: Diagnosis not present

## 2020-04-24 DIAGNOSIS — Z79899 Other long term (current) drug therapy: Secondary | ICD-10-CM | POA: Diagnosis not present

## 2020-04-24 DIAGNOSIS — I351 Nonrheumatic aortic (valve) insufficiency: Secondary | ICD-10-CM | POA: Diagnosis not present

## 2020-04-24 DIAGNOSIS — R1319 Other dysphagia: Secondary | ICD-10-CM

## 2020-04-24 DIAGNOSIS — I2510 Atherosclerotic heart disease of native coronary artery without angina pectoris: Secondary | ICD-10-CM | POA: Diagnosis not present

## 2020-04-24 DIAGNOSIS — E782 Mixed hyperlipidemia: Secondary | ICD-10-CM

## 2020-04-24 MED ORDER — LOSARTAN POTASSIUM 50 MG PO TABS: 50.0000 mg | ORAL_TABLET | Freq: Every day | ORAL | 3 refills | Status: DC

## 2020-04-24 MED ORDER — ESOMEPRAZOLE MAGNESIUM 40 MG PO CPDR: 40.0000 mg | DELAYED_RELEASE_CAPSULE | Freq: Every day | ORAL | 11 refills | Status: DC

## 2020-04-24 NOTE — Patient Instructions (Addendum)
Medication Instructions:  Your physician has recommended you make the following change in your medication:  1.  START Losartan 50 mg taking 1 daily 2.  START Nexium 40 mg taking 1 daily    *If you need a refill on your cardiac medications before your next appointment, please call your pharmacy*   Lab Work: 05/02/20: COME TO THE OFFICE FOR LAB WORK.  ANYTIME AFTER 7:30-4:30:  BMET  If you have labs (blood work) drawn today and your tests are completely normal, you will receive your results only by: [name] MyChart Message (if you have MyChart) OR . A paper copy in the mail If you have any lab test that is abnormal or we need to change your treatment, we will call you to review the results.   Testing/Procedures: None ordered   Follow-Up: At [facility], you and your health needs are our priority.  As part of our continuing mission to provide you with exceptional heart care, we have created designated Provider Care Teams.  These Care Teams include your primary Cardiologist (physician) and Advanced Practice Providers (APPs -  Physician Assistants and Nurse Practitioners) who all work together to provide you with the care you need, when you need it.  We recommend signing up for the patient portal called "MyChart".  Sign up information is provided on this After Visit Summary.  MyChart is used to connect with patients for Virtual Visits (Telemedicine).  Patients are able to view lab/test results, encounter notes, upcoming appointments, etc.  Non-urgent messages can be sent to your provider as well.   To learn more about what you can do with MyChart, go to [email or web address].    Your next appointment:   2 week(s)  The format for your next appointment:   In Person  Provider:   Gwyndolyn Kaufman, MD   Other Instructions

## 2020-04-24 NOTE — Addendum Note (Signed)
Addended by: Gaetano Net on: 04/24/2020 11:18 AM   Modules accepted: Orders

## 2020-05-02 ENCOUNTER — Other Ambulatory Visit: Payer: Self-pay

## 2020-05-02 ENCOUNTER — Other Ambulatory Visit: Payer: Medicare Other | Admitting: *Deleted

## 2020-05-02 ENCOUNTER — Encounter (INDEPENDENT_AMBULATORY_CARE_PROVIDER_SITE_OTHER): Payer: Self-pay

## 2020-05-02 DIAGNOSIS — Z79899 Other long term (current) drug therapy: Secondary | ICD-10-CM

## 2020-05-02 LAB — BASIC METABOLIC PANEL
BUN/Creatinine Ratio: 11 (ref 10–24)
BUN: 14 mg/dL (ref 8–27)
CO2: 24 mmol/L (ref 20–29)
Calcium: 9.6 mg/dL (ref 8.6–10.2)
Chloride: 102 mmol/L (ref 96–106)
Creatinine, Ser: 1.31 mg/dL — ABNORMAL HIGH (ref 0.76–1.27)
Glucose: 89 mg/dL (ref 65–99)
Potassium: 5.3 mmol/L — ABNORMAL HIGH (ref 3.5–5.2)
Sodium: 139 mmol/L (ref 134–144)
eGFR: 58 mL/min/{1.73_m2} — ABNORMAL LOW (ref >59)

## 2020-05-04 ENCOUNTER — Other Ambulatory Visit: Payer: Self-pay | Admitting: *Deleted

## 2020-05-04 ENCOUNTER — Telehealth: Payer: Self-pay | Admitting: Cardiology

## 2020-05-04 MED ORDER — AMLODIPINE BESYLATE 5 MG PO TABS: 5.0000 mg | ORAL_TABLET | Freq: Every day | ORAL | 3 refills | Status: DC

## 2020-05-04 NOTE — Telephone Encounter (Signed)
Called and spoke to the patient. Told him to start the amlodipine (and not take the losartan) at this time. He will keep a blood pressure log for [place] to review.

## 2020-05-04 NOTE — Telephone Encounter (Signed)
Pt c/o medication issue:  1. Name of Medication: Losartan/Amlodipine, per note attached to 05/03/20 lab results  2. How are you currently taking this medication (dosage and times per day)? N/a   3. Are you having a reaction (difficulty breathing--STAT)? N/a   4. What is your medication issue?   Patient is following up regarding Dr. Jacolyn Reedy recommended medication change. He is requesting to discuss it again for clarification.

## 2020-05-04 NOTE — Telephone Encounter (Signed)
Spoke with pt who reports he never started Losartan.  Pt asking what else could be increasing his K+.  Pt understands he is to start Amlodipine 5mg  - 1 tablet by mouth daily.  Pt requests that Dr Johney Frame call him.  Will forward information to Dr Johney Frame for further review.

## 2020-05-08 NOTE — Progress Notes (Deleted)
Cardiology Office Note:    Date:  05/08/2020   ID:  Adam Burton, DOB 04/14/1950, MRN [id number]  PCP:  Adam Rio, MD   [place]  Cardiologist:  No primary care provider on file. *** Advanced Practice Provider:  No care team member to display Electrophysiologist:  None   Referring MD: Adam Rio, MD     History of Present Illness:    Adam Burton is a 70 y.o. male with a hx of CAD s/p prox-mid LAD DES in 07/2017, HTN, HLD, DMII who presents to clinic for follow-up.  Patient was previously followed by Dr Adam Burton. During his last visit on 03/03/20, he was doing well. TTE with normal LVEF, G1DD, mild AR. Lexiscan in 02/21 without diaphragmatic attenuation but no ischemia. EF was called low but TTE in 2021 with EF 61%. He was continued on ASA, statin and metop.   Last seen in clinic on 04/24/20 where he was having episodes of nonexertional chest discomfort and elevated blood pressures at home. We recommended him to start losartan, but he had elevated K on labs prior to starting so therefore we recommended amlodipine.  Past Medical History:  Diagnosis Date  . Diabetes mellitus without complication ([place])   . Hypercholesteremia   . Hypertension   . Renal insufficiency 08/21/2012    Past Surgical History:  Procedure Laterality Date  . CARDIAC CATHETERIZATION  01/2009   Dr. Martinique. Normal coronary arteries. Normal LV function.  . CORONARY ANGIOGRAPHY N/A 08/04/2018   Procedure: CORONARY ANGIOGRAPHY (CATH LAB);  Surgeon: Nigel Mormon, MD;  Location: [facility];  Service: Cardiovascular;  Laterality: N/A;  . CORONARY STENT INTERVENTION N/A 08/04/2018   Procedure: CORONARY STENT INTERVENTION;  Surgeon: Nigel Mormon, MD;  Location: [facility];  Service: Cardiovascular;  Laterality: N/A;  . HERNIA REPAIR    . NM MYOVIEW LTD  12/2008   Potential small area of mild ischemia in the basal inferoseptal wall.  Otherwise normal. --> False positive test based on coronary angiography  . Stress Echocardiogram  12/2013   Exercise for just over 4 minutes. Heart rate increased to 148 BPM. Hypertensive response to exercise. EF 60%. No evidence of ischemia. NORMAL STUDY  . TRANSTHORACIC ECHOCARDIOGRAM  11/2013   EF 55-60%. No regional wall motion abnormality is. Normal thickness. Mild LA dilation.    Current Medications: No outpatient medications have been marked as taking for the 05/11/20 encounter (Appointment) with Freada Bergeron, MD.     Allergies:   Patient has no known allergies.   Social History   Socioeconomic History  . Marital status: Single    Spouse name: Not on file  . Number of children: 3  . Years of education: Not on file  . Highest education level: Not on file  Occupational History  . Not on file  Tobacco Use  . Smoking status: Never Smoker  . Smokeless tobacco: Never Used  Vaping Use  . Vaping Use: Never used  Substance and Sexual Activity  . Alcohol use: Yes    Comment: beer or vodka occasionally  . Drug use: Yes    Types: Marijuana    Comment: 3-6 X per week  . Sexual activity: Yes  Other Topics Concern  . Not on file  Social History Narrative   He is a widowed father of 10, grandfather of 3. He lives with his current girlfriend. 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   Social Determinants of Health   Financial Resource Strain: Not on file  Food Insecurity: Not on file  Transportation Needs: Not on file  Physical Activity: Not on file  Stress: Not on file  Social Connections: Not on file     Family History: The patient's ***family history includes Diabetes in his maternal aunt, maternal grandmother, mother, and sister; Heart attack in his maternal  grandmother and mother. There is no history of Colon cancer, Colon polyps, Esophageal cancer, Rectal cancer, or Stomach cancer.  ROS:   Please see the history of present illness.    *** All other systems reviewed and are negative.  EKGs/Labs/Other Studies Reviewed:    The following studies were reviewed today: Echocardiogram 02/24/2020:  Left ventricle cavity is normal in size and wall thickness. Normal global  wall motion. Normal LV systolic function with EF 61%. Doppler evidence of  grade I (impaired) diastolic dysfunction, normal LAP.  Mild (Grade I) aortic regurgitation.  Normal right atrial pressure.  Compared to previous study on 02/23/2019, AI reduced from grade II to  grade I. MR is now absent.   Lexiscan (Walking with mod Bruce)Tetrofosmin Stress Test 04/05/2019: Nondiagnostic ECG stress. Perfusion images revealed diaphragmatic attenuation artifact in the inferior wall without evidence of ischemia or scar. The left ventricle is mildly dilated. There was no regional wall motion abnormality. Mild global hypokinesis. Calculated LVEF moderately reduced at 39%, visually EF appears to be 45%. Low risk study.  Compared 12/08/2017, no significant change in perfusion, however there was significant EKG abnormalities noted associated with chest pain on the treadmill stress test. Clinical correlation recommended. Low risk study.  Coronary angiography and intervention 08/04/2018: LM: Normal LAD: Prox-mid 75% stenosis. CTA FFR 0.75. Diag 1 small vessel with 70% disease LCx: Normal RCA: Prox 60% stenosis. CTA FFR 0.85  Successful percutaneous coronary intervention prox-mid LAD PTCA and stent placement Resolute Onyx 3.5 X 22 mm drug-eluting stent Recommend medical management for small vessel diag disease, and RCA nonobstructive disease.   EKG:  EKG is *** ordered today.  The ekg ordered today demonstrates ***  Recent Labs: 05/02/2020: BUN 14; Creatinine, Ser 1.31;  Potassium 5.3; Sodium 139  Recent Lipid Panel    Component Value Date/Time   CHOL 127 06/15/2019 1228   TRIG 71 06/15/2019 1228   HDL 60 06/15/2019 1228   CHOLHDL 2.1 06/15/2019 1228   CHOLHDL 5.9 (H) 11/01/2014 1026   VLDL 23 11/01/2014 1026   LDLCALC 53 06/15/2019 1228   LDLDIRECT 84 09/03/2011 0930     Risk Assessment/Calculations:   {Does this patient have ATRIAL FIBRILLATION?:[id number]}   Physical Exam:    VS:  There were no vitals taken for this visit.    Wt Readings from Last 3 Encounters:  04/24/20 180 lb 12.8 oz (82 kg)  03/16/20 180 lb (81.6 kg)  03/03/20 181 lb 12.8 oz (82.5 kg)     GEN: *** Well nourished, well developed in no acute distress HEENT: Normal NECK: No JVD; No carotid bruits LYMPHATICS: No lymphadenopathy CARDIAC: ***RRR, no murmurs, rubs, gallops RESPIRATORY:  Clear to auscultation without rales, wheezing or rhonchi  ABDOMEN: Soft, non-tender, non-distended MUSCULOSKELETAL:  No edema; No deformity  SKIN: Warm and dry NEUROLOGIC:  Alert and oriented x 3 PSYCHIATRIC:  Normal affect   ASSESSMENT:  No diagnosis found. PLAN:    In order of problems listed above:  #Known CAD s/p prox-mid PCI of LAD: S/p DES with Resolute Onyx 3.5 X 22 mm (07/2018), 70% diag 1 and 60% prox RCA disease (dFR 0.85). Lexiscan in 03/2019 without ischemia. TTE in 01/2020 with LVEF 61%, G1 DD, mild AR -Continue ASA 81mg  daily -Continue Crestor 20mg  daily -Continue metop succinate 50mg  daily -No ACE/ARB for now due to elevated K -BMET next week  #Noncardiac Chest Pain: Patient with chest discomfort after eating/drinking. No exertional symptoms and patient able to walk 2-3 miles without issues. Myoview negative for ischemia. TTE with normal EF, no WMA. -Continue nexium -Manage known CAD as above  #HTN: -Continue metop 50mg  daily -Continue amlodipine 5mg  daily  #HLD: Well controlled. LDL 53. -Continue crestor 20mg  daily  #DMII: -Continue  metformin  #Mild AR: -Monitor with serial TTEs   {Are you ordering a CV Procedure (e.g. stress test, cath, DCCV, TEE, etc)?   Press F2        :[id number]}    Medication Adjustments/Labs and Tests Ordered: Current medicines are reviewed at length with the patient today.  Concerns regarding medicines are outlined above.  No orders of the defined types were placed in this encounter.  No orders of the defined types were placed in this encounter.   There are no Patient Instructions on file for this visit.   Signed, Freada Bergeron, MD  05/08/2020 9:16 PM     [facility]

## 2020-05-11 ENCOUNTER — Ambulatory Visit: Payer: Medicare Other | Admitting: Cardiology

## 2020-05-19 NOTE — Progress Notes (Signed)
Cardiology Office Note:    Date:  05/22/2020   ID:  Adam Burton, DOB [birth year], MRN [id number]  PCP:  [name], MD   [place]  Cardiologist:  No primary care provider on file.  Advanced Practice Provider:  No care team member to display Electrophysiologist:  None   Referring MD: [name], MD    History of Present Illness:    Adam Burton is a 70 y.o. male with a hx of CAD s/p prox-mid LAD DES in 07/2017, HTN, HLD, DMII who presents to clinic for follow-up.  Patient was previously followed by Dr Virgina Jock. During his last visit on 03/03/20, he was doing well. TTE with normal LVEF, G1DD, mild AR. Lexiscan in 02/21 without diaphragmatic attenuation but no ischemia. EF was called low but TTE in 2021 with EF 61%. He was continued on ASA, statin and metop.   Last seen in clinic on 04/24/20 where he was having episodes of nonexertional chest discomfort and elevated blood pressures at home. We recommended him to start losartan, but he had elevated K on labs prior to starting so therefore we recommended amlodipine.  The patient states that he feels well. No chest pain, shortness of breath, DOE. Blood pressure better controlled; mainly 140s in the AM and better in the evening. Has not yet started the amlodipine. Underwent life line health screening. Carotids were negative for plaque. ABI negative for PAD. No abdominal aortic aneurysm.  Past Medical History:  Diagnosis Date  . Diabetes mellitus without complication ([place])   . Hypercholesteremia   . Hypertension   . Renal insufficiency 08/21/2012    Past Surgical History:  Procedure Laterality Date  . CARDIAC CATHETERIZATION  01/2009   Dr. [place]. Normal coronary arteries. Normal LV function.  . CORONARY ANGIOGRAPHY N/A 08/04/2018   Procedure: CORONARY ANGIOGRAPHY (CATH LAB);  Surgeon: Nigel Mormon, MD;  Location: [facility];  Service: Cardiovascular;  Laterality: N/A;   . CORONARY STENT INTERVENTION N/A 08/04/2018   Procedure: CORONARY STENT INTERVENTION;  Surgeon: Nigel Mormon, MD;  Location: [facility];  Service: Cardiovascular;  Laterality: N/A;  . HERNIA REPAIR    . NM MYOVIEW LTD  12/2008   Potential small area of mild ischemia in the basal inferoseptal wall. Otherwise normal. --> False positive test based on coronary angiography  . Stress Echocardiogram  12/2013   Exercise for just over 4 minutes. Heart rate increased to 148 BPM. Hypertensive response to exercise. EF 60%. No evidence of ischemia. NORMAL STUDY  . TRANSTHORACIC ECHOCARDIOGRAM  11/2013   EF 55-60%. No regional wall motion abnormality is. Normal thickness. Mild LA dilation.    Current Medications: Current Meds  Medication Sig  . acetaminophen (TYLENOL) 500 MG tablet Take 2 tablets (1,000 mg total) by mouth every 8 (eight) hours as needed for moderate pain.  [name] amLODipine (NORVASC) 5 MG tablet Take 1 tablet (5 mg total) by mouth daily.  [name] aspirin EC 81 MG tablet Take 1 tablet (81 mg total) by mouth daily.  [name] esomeprazole (NEXIUM) 40 MG capsule Take 1 capsule (40 mg total) by mouth daily at 12 noon.  . metFORMIN (GLUCOPHAGE-XR) 500 MG 24 hr tablet Take 2 tablets (1,000 mg total) by mouth daily with breakfast.  . metoprolol succinate (TOPROL-XL) 50 MG 24 hr tablet Take 1 tablet (50 mg total) by mouth daily. Take with or immediately following a meal.  . nitroGLYCERIN (NITROSTAT) 0.4 MG SL tablet Place 1 tablet (0.4  mg total) under the tongue every 5 (five) minutes as needed for chest pain.  . rosuvastatin (CRESTOR) 20 MG tablet Take 1 tablet (20 mg total) by mouth daily.  . tamsulosin (FLOMAX) 0.4 MG CAPS capsule Take 0.4 mg by mouth daily.  . [DISCONTINUED] oxybutynin (DITROPAN-XL) 5 MG 24 hr tablet Take 5 mg by mouth at bedtime.     Allergies:   Patient has no known allergies.   Social History   Socioeconomic History  . Marital status: Single    Spouse name: Not on  file  . Number of children: 3  . Years of education: Not on file  . Highest education level: Not on file  Occupational History  . Not on file  Tobacco Use  . Smoking status: Never Smoker  . Smokeless tobacco: Never Used  Vaping Use  . Vaping Use: Never used  Substance and Sexual Activity  . Alcohol use: Yes    Comment: beer or vodka occasionally  . Drug use: Yes    Types: Marijuana    Comment: 3-6 X per week  . Sexual activity: Yes  Other Topics Concern  . Not on file  Social History Narrative   He is a widowed father of 39, grandfather of 3. He lives with his current girlfriend. 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   Social Determinants of Health   Financial Resource Strain: Not on file  Food Insecurity: Not on file  Transportation Needs: Not on file  Physical Activity: Not on file  Stress: Not on file  Social Connections: Not on file     Family History: The patient's family history includes Diabetes in his maternal aunt, maternal grandmother, mother, and sister; Heart attack in his maternal grandmother and mother. There is no history of Colon cancer, Colon polyps, Esophageal cancer, Rectal cancer, or Stomach cancer.  ROS:   Please see the history of present illness.    Review of Systems  Constitutional: Negative for chills and fever.  HENT: Negative for hearing loss and sore throat.   Eyes: Negative for blurred vision and redness.  Respiratory: Negative for shortness of breath.   Cardiovascular: Negative for chest pain, palpitations, orthopnea, claudication, leg swelling and PND.  Gastrointestinal: Negative for nausea and vomiting.  Genitourinary: Negative for dysuria and flank pain.  Musculoskeletal: Negative for falls.  Neurological: Negative for dizziness and  loss of consciousness.  Endo/Heme/Allergies: Negative for polydipsia.  Psychiatric/Behavioral: Negative for substance abuse.    EKGs/Labs/Other Studies Reviewed:    The following studies were reviewed today: Echocardiogram 02/24/2020:  Left ventricle cavity is normal in size and wall thickness. Normal global  wall motion. Normal LV systolic function with EF 61%. Doppler evidence of  grade I (impaired) diastolic dysfunction, normal LAP.  Mild (Grade I) aortic regurgitation.  Normal right atrial pressure.  Compared to previous study on 02/23/2019, AI reduced from grade II to  grade I. MR is now absent.   Lexiscan (Walking with mod Bruce)Tetrofosmin Stress Test 04/05/2019: Nondiagnostic ECG stress. Perfusion images revealed diaphragmatic attenuation artifact in the inferior wall without evidence of ischemia or scar. The left ventricle is mildly dilated. There was no regional wall motion abnormality. Mild global hypokinesis. Calculated LVEF moderately reduced at 39%, visually EF appears to be 45%. Low risk study.  Compared 12/08/2017, no significant change in perfusion, however there was significant EKG abnormalities noted associated with chest pain on the treadmill stress test. Clinical correlation recommended. Low risk study.  Coronary angiography and intervention 08/04/2018: LM: Normal LAD: Prox-mid 75% stenosis. CTA FFR 0.75. Diag 1 small vessel with 70% disease LCx: Normal RCA: Prox 60% stenosis. CTA FFR 0.85  Successful percutaneous coronary intervention prox-mid LAD PTCA and stent placement Resolute Onyx 3.5 X 22 mm drug-eluting stent Recommend medical management for small vessel diag disease, and RCA nonobstructive disease.   Recent Labs: 05/02/2020: BUN 14; Creatinine, Ser 1.31; Potassium 5.3; Sodium 139  Recent Lipid Panel    Component Value Date/Time   CHOL 127 06/15/2019 1228   TRIG 71 06/15/2019 1228   HDL 60 06/15/2019 1228   CHOLHDL 2.1 06/15/2019  1228   CHOLHDL 5.9 (H) 11/01/2014 1026   VLDL 23 11/01/2014 1026   LDLCALC 53 06/15/2019 1228   LDLDIRECT 84 09/03/2011 0930      Physical Exam:    VS:  BP 124/88   Pulse 65   Ht 5\' 11"  (1.803 m)   Wt 184 lb (83.5 kg)   SpO2 99%   BMI 25.66 kg/m     Wt Readings from Last 3 Encounters:  05/22/20 184 lb (83.5 kg)  04/24/20 180 lb 12.8 oz (82 kg)  03/16/20 180 lb (81.6 kg)     GEN:  Well nourished, well developed in no acute distress HEENT: Normal NECK: No JVD; No carotid bruits CARDIAC: RRR, 1/6 soft systolic murmur. No rubs, gallops RESPIRATORY:  Clear to auscultation without rales, wheezing or rhonchi  ABDOMEN: Soft, non-tender, non-distended MUSCULOSKELETAL:  No edema; No deformity  SKIN: Warm and dry NEUROLOGIC:  Alert and oriented x 3 PSYCHIATRIC:  Normal affect   ASSESSMENT:    1. Coronary artery disease involving native coronary artery of native heart without angina pectoris   2. Medication management   3. Essential hypertension   4. S/P primary angioplasty with coronary stent   5. Mixed hyperlipidemia   6. Nonrheumatic aortic valve insufficiency   7. Esophageal dysphagia    PLAN:    In order of problems listed above:  #Known CAD s/p prox-mid PCI of LAD: S/p DES withResolute Onyx 3.5 X 22 mm (07/2018),70% diag 1 and 60% prox RCA disease (dFR 0.85). Lexiscan in 03/2019 without ischemia. TTE in 01/2020 with LVEF 61%, G1 DD, mild AR. Doing well without anginal symptoms.  -Continue ASA 81mg  daily -Continue Crestor 20mg  daily -Continue metop succinate 50mg  daily -No ACE/ARB due to hyperK (5.3)  #Noncardiac Chest Pain: Improved. Patient with chest discomfort after eating/drinking. No exertional symptoms and patient able to walk 2-3 miles without issues. Myoview negative for ischemia. TTE with normal EF, no WMA. -Continue nexium -Manage known CAD as above  #HTN: Better controlled but elevated to 140s in the AM. Concerned that cuff pressures are not  correlating with home BP monitor. -Continue metop 50mg  daily -Start amlodipine 5mg  qHS for better AM coverage -HTN clinic to ensure BP well controlled and accuracy of cuff -Low Na diet    #HLD: Well controlled. LDL 53. -Continue crestor 20mg  daily -Followed by PCP  #DMII: -Continue metformin  #Mild AR: -Monitor with serial TTEs (next echo 2024)   Medication Adjustments/Labs and Tests Ordered: Current medicines are reviewed at length with the patient today.  Concerns regarding medicines are outlined above.  Orders Placed This Encounter  Procedures  . AMB Referral to [facility]   No orders of  the defined types were placed in this encounter.   Patient Instructions  Medication Instructions:   Your physician recommends that you continue on your current medications as directed. Please refer to the Current Medication list given to you today.  *If you need a refill on your cardiac medications before your next appointment, please call your pharmacy*   You have been referred to [facility]--BRING YOUR CUFF TO THIS VISIT.    Follow-Up: At [facility], you and your health needs are our priority.  As part of our continuing mission to provide you with exceptional heart care, we have created designated Provider Care Teams.  These Care Teams include your primary Cardiologist (physician) and Advanced Practice Providers (APPs -  Physician Assistants and Nurse Practitioners) who all work together to provide you with the care you need, when you need it.  We recommend signing up for the patient portal called "MyChart".  Sign up information is provided on this After Visit Summary.  MyChart is used to connect with patients for Virtual Visits (Telemedicine).  Patients are able to view lab/test results, encounter notes, upcoming appointments, etc.  Non-urgent messages can be sent to your provider as well.   To learn more  about what you can do with MyChart, go to [email or web address].    Your next appointment:   6 month(s)  The format for your next appointment:   In Person  Provider:   You will see one of the following Advanced Practice Providers on your designated Care Team:    Richardson Dopp, PA-C  Robbie Lis, PA-C         Signed, Freada Bergeron, MD  05/22/2020 9:08 AM    [place]

## 2020-05-22 ENCOUNTER — Ambulatory Visit (INDEPENDENT_AMBULATORY_CARE_PROVIDER_SITE_OTHER): Payer: Medicare Other | Admitting: Cardiology

## 2020-05-22 ENCOUNTER — Other Ambulatory Visit: Payer: Self-pay

## 2020-05-22 ENCOUNTER — Encounter: Payer: Self-pay | Admitting: Cardiology

## 2020-05-22 VITALS — BP 124/88 | HR 65 | Ht 71.0 in | Wt 184.0 lb

## 2020-05-22 DIAGNOSIS — Z79899 Other long term (current) drug therapy: Secondary | ICD-10-CM | POA: Diagnosis not present

## 2020-05-22 DIAGNOSIS — I2510 Atherosclerotic heart disease of native coronary artery without angina pectoris: Secondary | ICD-10-CM

## 2020-05-22 DIAGNOSIS — I351 Nonrheumatic aortic (valve) insufficiency: Secondary | ICD-10-CM | POA: Diagnosis not present

## 2020-05-22 DIAGNOSIS — E782 Mixed hyperlipidemia: Secondary | ICD-10-CM | POA: Diagnosis not present

## 2020-05-22 DIAGNOSIS — R1319 Other dysphagia: Secondary | ICD-10-CM | POA: Diagnosis not present

## 2020-05-22 DIAGNOSIS — I10 Essential (primary) hypertension: Secondary | ICD-10-CM

## 2020-05-22 DIAGNOSIS — Z955 Presence of coronary angioplasty implant and graft: Secondary | ICD-10-CM | POA: Diagnosis not present

## 2020-05-22 NOTE — Patient Instructions (Signed)
Medication Instructions:   Your physician recommends that you continue on your current medications as directed. Please refer to the Current Medication list given to you today.  *If you need a refill on your cardiac medications before your next appointment, please call your pharmacy*   You have been referred to [facility]--BRING YOUR CUFF TO THIS VISIT.    Follow-Up: At [facility], you and your health needs are our priority.  As part of our continuing mission to provide you with exceptional heart care, we have created designated Provider Care Teams.  These Care Teams include your primary Cardiologist (physician) and Advanced Practice Providers (APPs -  Physician Assistants and Nurse Practitioners) who all work together to provide you with the care you need, when you need it.  We recommend signing up for the patient portal called "MyChart".  Sign up information is provided on this After Visit Summary.  MyChart is used to connect with patients for Virtual Visits (Telemedicine).  Patients are able to view lab/test results, encounter notes, upcoming appointments, etc.  Non-urgent messages can be sent to your provider as well.   To learn more about what you can do with MyChart, go to [email or web address].    Your next appointment:   6 month(s)  The format for your next appointment:   In Person  Provider:   You will see one of the following Advanced Practice Providers on your designated Care Team:    Richardson Dopp, PA-C  [place], [place]

## 2020-05-30 ENCOUNTER — Other Ambulatory Visit: Payer: Self-pay | Admitting: Family Medicine

## 2020-06-01 ENCOUNTER — Other Ambulatory Visit: Payer: Self-pay

## 2020-06-05 NOTE — Progress Notes (Signed)
Patient ID: Adam Burton                 DOB: 07/29/1950                      MRN: [id number]     HPI: Adam Burton is a 69 y.o. male referred by Dr. Pemberton to HTN clinic. PMH is significant for CAD s/p prox-mid LAD DES in 07/2017, HTN, HLD, and T2DM.   Last seen on 3/28 by Dr. Pemberton and BP 124/88 was near goal. Pt reported BP better controlled - mainly 140s in the AM and better in the evening, however did not yet start amlodipine 5 mg daily as prescribed. Denied SOB and chest pain. Provider was concerned clinic BP readings were not correlating with home BP readings, so instructed pt to bring BP monitor to next visit. Additionally, provider avoiding ACE/ARB due to hyperkalemia (K 5.3).  Patient presents today in good spirits. Pt brought medication bottles with him and reported medication adherence with amlodipine 5 mg daily and Toprol XL 50 mg daily with no missed doses. Denies dizziness, headaches, blurry vision, fatigue and LE swelling. Evening BP ranged between 100-131/60-80s with two highs of 141/78 and 145/83. Additionally, pt brought home BP monitor for calibration. Pt demonstrated he sits in a chair with feet flat on the floor and not crossed, and arm relaxed at heart level. However, pt does not tighten cuff properly before being inflated. Today, his home BP monitor measured 169/107 and HR 64. Discussed diet and exercise regimen below.  Current HTN meds: amlodipine 5 mg daily, Toprol XL 50 mg daily Previously tried: HCTZ 25 mg daily, lisinopril 20 mg daily BP goal: <130/80 mmHg  Family History: Diabetes in his maternal aunt, maternal grandmother, mother, and sister; Heart attack in his maternal grandmother and mother  Social History: denies tobacco use, marijuana 2x daily, alcohol occasionally   Diet: Occupation Chef -Breakfast: hot chocolate, bread and cheese -Lunch: usually skip -Dinner: pasta, rice, beans, stew, spinach rice, fish,  rarely chicken,, not much red meat, can  cut back on salt -Snacks: peanuts and cranberry -Drinks: liquor occasionally, hard time drinking water, some juice   Exercise: walks long distances 2-3x/week  Home BP readings:  AM before meds: 137/85, 136/88, 132/81, 114/75, 148/84, 137/84, 136/80, 112/79, 147/89 PM after meds: 116/70, 106/60, 141/78, 100/66, 145/83, 131/76, 105/67, 136/80  Labs: 05/02/20: K 5.3, Scr 1.31, eGFR 58, Na 139  Wt Readings from Last 3 Encounters:  05/22/20 184 lb (83.5 kg)  04/24/20 180 lb 12.8 oz (82 kg)  03/16/20 180 lb (81.6 kg)   BP Readings from Last 3 Encounters:  06/06/20 (!) 158/100  05/22/20 124/88  04/24/20 (!) 178/84   Pulse Readings from Last 3 Encounters:  06/06/20 61  05/22/20 65  04/24/20 67    Renal function: CrCl cannot be calculated (Patient's most recent lab result is older than the maximum 21 days allowed.).  Past Medical History:  Diagnosis Date  . Diabetes mellitus without complication ([facility])   . Hypercholesteremia   . Hypertension   . Renal insufficiency 08/21/2012    Current Outpatient Medications on File Prior to Visit  Medication Sig Dispense Refill  . acetaminophen (TYLENOL) 500 MG tablet Take 2 tablets (1,000 mg total) by mouth every 8 (eight) hours as needed for moderate pain. 30 tablet 0  . amLODipine (NORVASC) 5 MG tablet Take 1 tablet (5 mg total) by mouth daily. 90 tablet 3  . aspirin EC 81   MG tablet Take 1 tablet (81 mg total) by mouth daily. 90 tablet 3  . esomeprazole (NEXIUM) 40 MG capsule Take 1 capsule (40 mg total) by mouth daily at 12 noon. 30 capsule 11  . metFORMIN (GLUCOPHAGE-XR) 500 MG 24 hr tablet TAKE 2 TABLETS BY MOUTH ONCE DAILY WITH BREAKFAST 180 tablet 3  . metoprolol succinate (TOPROL-XL) 50 MG 24 hr tablet Take 1 tablet (50 mg total) by mouth daily. Take with or immediately following a meal. 90 tablet 3  . nitroGLYCERIN (NITROSTAT) 0.4 MG SL tablet Place 1 tablet (0.4 mg total) under the tongue every 5 (five) minutes as needed for chest  pain. 30 tablet 3  . rosuvastatin (CRESTOR) 20 MG tablet Take 1 tablet (20 mg total) by mouth daily. 90 tablet 3  . tamsulosin (FLOMAX) 0.4 MG CAPS capsule Take 0.4 mg by mouth daily.     No current facility-administered medications on file prior to visit.    No Known Allergies   Assessment/Plan:  1. Hypertension - BP above of goal <130/80 mmHg. Medication adherence appears optimal. Will increase amlodipine from 5 mg to 10 mg daily. Continued Toprol XL 50 mg daily. Discussed proper BP measuring techniques: sit in a chair with back support and ensure feet are flat on the floor and uncrossed. Make sure to moderately tighten BP cuff around arm and should be at heart level and to rest for 5 minutes prior to measuring BP. Patient verbalized understanding of these instructions. Encouraged patient to check BP at least 1 hour after medications and bring log to next visit. Encouraged patient to aim for a diet full of vegetables, fruit and lean meats (chicken, [place], fish) and to limit salt intake by eating fresh or frozen vegetables (instead of canned), rinse canned vegetables prior to cooking and do not add any additional salt to meals. Encouraged patient to exercise 20-30 minutes daily with the goal of 150 minutes per week. Patient verbalized understanding. Follow-up appointment scheduled in 3 weeks for BP check and to check BMET due to recent elevated in potassium level (did not want to check labs today due to feeling dehydrated).  Lorel Monaco, PharmD, [place] PGY2 Ambulatory Care Resident [place]

## 2020-06-06 ENCOUNTER — Other Ambulatory Visit: Payer: Self-pay

## 2020-06-06 ENCOUNTER — Ambulatory Visit (INDEPENDENT_AMBULATORY_CARE_PROVIDER_SITE_OTHER): Payer: Medicare Other | Admitting: Pharmacist

## 2020-06-06 VITALS — BP 158/100 | HR 61

## 2020-06-06 DIAGNOSIS — I10 Essential (primary) hypertension: Secondary | ICD-10-CM | POA: Diagnosis not present

## 2020-06-06 MED ORDER — AMLODIPINE BESYLATE 10 MG PO TABS: 10.0000 mg | ORAL_TABLET | Freq: Every day | ORAL | 3 refills | Status: DC

## 2020-06-06 NOTE — Patient Instructions (Addendum)
It was nice to see you today!  Your goal blood pressure is <130/80 mmHg.   Medication Changes: Start taking amlodipine 10 mg daily  Continue metoprolol ER 50 mg daily  Drink plenty of water for next visit!!   Monitor blood pressure at home daily and keep a log (on your phone or piece of paper) to bring with you to your next visit. Write down date, time, blood pressure and pulse.  Keep up the good work with diet and exercise. Aim for a diet full of vegetables, fruit and lean meats (chicken, [place], fish). Try to limit salt intake by eating fresh or frozen vegetables (instead of canned), rinse canned vegetables prior to cooking and do not add any additional salt to meals.   Please give [place] a call at [phone] with any questions or concerns.

## 2020-06-26 NOTE — Progress Notes (Signed)
Patient ID: Adam Burton                 DOB: November 04, 1950                      MRN: [id number]     HPI: Adam Burton is a 70 y.o. male referred by Dr. Johney Frame to HTN clinic. PMH is significant for CAD s/p prox-mid LAD DES in 07/2017, HTN, HLD, and T2DM. Pt last seen in HTN clinic on 06/06/20 and BP was 158/100. Home evening BP ranged 100-131/60-80s with two highs of 141/78 and 145/83. At that visit, amlodipine was increased from 5 to 10 mg daily.  Patient presents today in good spirits. Brought medication bottles with him which did not include Toprol XL. Reports medication adherence with amlodipine 10 mg daily and Toprol XL 50 mg daily and uses pill box. Reports rushing/running late this morning, so was unable to take HTN medications prior to visit today. Reports a slight headache that occurs once in a while in the mornings. Reports a little chest pain on the right side and some SOB with exertion. Reports this does not occur regularly. Denies dizziness, blurred vision, and LE swelling. Reports home BP average between 110s-120s/64-70s.   Current HTN meds: amlodipine 10 mg daily, Toprol XL 50 mg daily Previously tried: HCTZ 25 mg daily, lisinopril 20 mg daily BP goal: <130/80 mmHg  Family History: Diabetes in his maternal aunt, maternal grandmother, mother, and sister; Heart attack in his maternal grandmother and mother  Social History: denies tobacco use, marijuana 2x daily, alcohol occasionally   Diet: Occupation Chef -Breakfast: hot chocolate, bread and cheese -Lunch: usually skip -Dinner: pasta, rice, beans, stew, spinach rice, fish,  rarely chicken,, not much red meat, can cut back on salt -Snacks: peanuts and cranberry -Drinks: liquor occasionally, hard time drinking water, some juice   Exercise: walks long distances 2-3x/week  Home BP readings:  AM BP: 113/71, 113/78, 117/78, 122/79, 122/78, 135/74, 134/73, 126/79, 131/77 PM BP, 108/69, 104/64, 100/66, 116/77, 112/71, 118/73,  111/69, 119/78, 125/75  Labs: 05/02/20: K 5.3, Scr 1.31, eGFR 58, Na 139  Wt Readings from Last 3 Encounters:  05/22/20 184 lb (83.5 kg)  04/24/20 180 lb 12.8 oz (82 kg)  03/16/20 180 lb (81.6 kg)   BP Readings from Last 3 Encounters:  06/27/20 (!) 144/100  06/06/20 (!) 158/100  05/22/20 124/88   Pulse Readings from Last 3 Encounters:  06/27/20 85  06/06/20 61  05/22/20 65    Renal function: CrCl cannot be calculated (Patient's most recent lab result is older than the maximum 21 days allowed.).  Past Medical History:  Diagnosis Date  . Diabetes mellitus without complication ([place])   . Hypercholesteremia   . Hypertension   . Renal insufficiency 08/21/2012    Current Outpatient Medications on File Prior to Visit  Medication Sig Dispense Refill  . acetaminophen (TYLENOL) 500 MG tablet Take 2 tablets (1,000 mg total) by mouth every 8 (eight) hours as needed for moderate pain. 30 tablet 0  . amLODipine (NORVASC) 10 MG tablet Take 1 tablet (10 mg total) by mouth daily. 180 tablet 3  . aspirin EC 81 MG tablet Take 1 tablet (81 mg total) by mouth daily. 90 tablet 3  . esomeprazole (NEXIUM) 40 MG capsule Take 1 capsule (40 mg total) by mouth daily at 12 noon. 30 capsule 11  . metFORMIN (GLUCOPHAGE-XR) 500 MG 24 hr tablet TAKE 2 TABLETS BY MOUTH ONCE DAILY WITH BREAKFAST  180 tablet 3  . nitroGLYCERIN (NITROSTAT) 0.4 MG SL tablet Place 1 tablet (0.4 mg total) under the tongue every 5 (five) minutes as needed for chest pain. 30 tablet 3  . rosuvastatin (CRESTOR) 20 MG tablet Take 1 tablet (20 mg total) by mouth daily. 90 tablet 3  . tamsulosin (FLOMAX) 0.4 MG CAPS capsule Take 0.4 mg by mouth daily.     No current facility-administered medications on file prior to visit.    No Known Allergies  Assessment/Plan:  1. Hypertension - Clinic BP above goal <130/80 mmHg, however did not take HTN medications this morning yet. Home BP readings within target goal. Medication adherence appears  optimal with amlodipine 10 mg daily, however Toprol XL 50 mg was last filled 01/29/20 for a 90-day supply. Continued amlodipine 10 mg daily and Toprol XL 50 mg daily. Encouraged pt to pick up prescription of Toprol XL from pharmacy. Patient verbalized understanding. Advised patient to monitor symptoms of chest pain and to utilize nitroglycerin sublingual tablets to relieve pain if needed. Counseled patient to call 911 if chest pain does not resolve and/or after 2 doses of nitroglycerin. Follow-up appointment in HTN clinic in 6 weeks and with Richardson Dopp, PA in 1 week.  Lorel Monaco, PharmD, [place] [address] [place], [place] [address] Phone: [phone]; Fax: [phone]

## 2020-06-27 ENCOUNTER — Other Ambulatory Visit: Payer: Medicare Other | Admitting: *Deleted

## 2020-06-27 ENCOUNTER — Ambulatory Visit (INDEPENDENT_AMBULATORY_CARE_PROVIDER_SITE_OTHER): Payer: Medicare Other | Admitting: Pharmacist

## 2020-06-27 ENCOUNTER — Other Ambulatory Visit: Payer: Self-pay

## 2020-06-27 VITALS — BP 144/100 | HR 85

## 2020-06-27 DIAGNOSIS — I10 Essential (primary) hypertension: Secondary | ICD-10-CM

## 2020-06-27 LAB — BASIC METABOLIC PANEL
BUN/Creatinine Ratio: 8 — ABNORMAL LOW (ref 10–24)
BUN: 12 mg/dL (ref 8–27)
CO2: 23 mmol/L (ref 20–29)
Calcium: 9.6 mg/dL (ref 8.6–10.2)
Chloride: 93 mmol/L — ABNORMAL LOW (ref 96–106)
Creatinine, Ser: 1.42 mg/dL — ABNORMAL HIGH (ref 0.76–1.27)
Glucose: 103 mg/dL — ABNORMAL HIGH (ref 65–99)
Potassium: 4 mmol/L (ref 3.5–5.2)
Sodium: 133 mmol/L — ABNORMAL LOW (ref 134–144)
eGFR: 53 mL/min/{1.73_m2} — ABNORMAL LOW (ref >59)

## 2020-06-27 MED ORDER — METOPROLOL SUCCINATE ER 50 MG PO TB24: 50.0000 mg | ORAL_TABLET | Freq: Every day | ORAL | 3 refills | Status: DC

## 2020-06-27 NOTE — Patient Instructions (Signed)
It was nice to see you today!  Your goal blood pressure is <130/80 mmHg.  Medication Changes:  Continue amlodipine 10 mg daily  Remember to pick up metoprolol succinate (Toprol XL) 50 mg daily from your pharmacy!  Monitor blood pressure at home daily and keep a log (on your phone or piece of paper) to bring with you to your next visit. Write down date, time, blood pressure and pulse.  Keep up the good work with diet and exercise. Aim for a diet full of vegetables, fruit and lean meats (chicken, [place], fish). Try to limit salt intake by eating fresh or frozen vegetables (instead of canned), rinse canned vegetables prior to cooking and do not add any additional salt to meals.   Please give [place] a call at [phone] with any questions or concerns.

## 2020-06-29 NOTE — Progress Notes (Signed)
Cardiology Office Note:    Date:  06/30/2020   ID:  Adam Burton, DOB [birth year], MRN [id number]  PCP:  [name], MD   [place] Providers Cardiologist:  Freada Bergeron, MD     Referring MD: [name], MD   Chief Complaint:  Chest Pain    Patient Profile:    Adam Burton is a 70 y.o. male with:   Coronary artery disease   S/p DES to prox to mid LAD in 07/2018  Echocardiogram 12/21: EF 61, mild AI  Myoview 2/21: low risk  Hypertension   Hyperlipidemia   Diabetes mellitus   20171 Mild AI  Echocardiogram 12/21   Prior CV studies: Echocardiogram 02/24/2020 ([facility]):  EF 61, Gr 1 DD, mild AI  Lexiscan (Walking with mod Bruce)Tetrofosmin Stress Test  04/05/2019 ([facility]): Diaph atten, no ischemia or scar, EF 39; Low risk   CORONARY STENT INTERVENTION, CORONARY STENT INTERVENTION 08/04/2018 Narrative LM: Normal LAD: Prox-mid 75% stenosis. CTA FFR 0.75. Diag 1 small vessel with 70% disease LCx: Normal RCA: Prox 60% stenosis. CTA FFR 0.85 Successful percutaneous coronary intervention prox-mid LAD PTCA and stent placement Resolute Onyx 3.5 X 22 mm drug-eluting stent Recommend medical management for small vessel diag disease, and RCA nonobstructive disease.  CT CORONARY MORPH W/CTA COR W/SCORE W/CA W/CM &/OR WO/CM 01/28/2018 Addendum 01/29/2018 11:43 AM IMPRESSION: 1. CT FFR analysis showed severe stenosis in the proximal LAD. A cardiac catheterization is recommended. IMPRESSION: 1. Coronary calcium score of 221. This was 79 percentile for age and sex matched control. 2. Normal coronary origin with right dominance. 3. Severe plaque in the proximal LAD with stenosis > 70%, moderate CAD in the proximal to mid RCA. A cardiac catheterization is recommended.  Stress Echocardiogram 1/81/8 Normal; normal EF at rest  Echocardiogram 12/02/13 GLS -19.4%, EF 55-60, no RWMA, mild LAE   History of Present  Illness: Adam Burton was last seen by Dr. Johney Frame in 3/22.  He has also been followed in the HTN clinic for BP control.  He was last seen in the HTN clinic 06/27/20 and reported some R sided chest pain.  He is seen for evaluation.  He is here alone.  He has noticed right-sided chest discomfort over the past 1 to 2 weeks.  It does not seem to be brought on by exertion.  He feels that the quality of discomfort is similar to what he had on the left side of his chest prior to his PCI.  He has not had any associated shortness of breath, nausea or diaphoresis.  He has noted some dyspnea with exertion over the past month.  He has not had syncope, orthopnea, leg edema.        Past Medical History:  Diagnosis Date  . Diabetes mellitus without complication ([place])   . Hypercholesteremia   . Hypertension   . Renal insufficiency 08/21/2012    Current Medications: Current Meds  Medication Sig  . acetaminophen (TYLENOL) 500 MG tablet Take 2 tablets (1,000 mg total) by mouth every 8 (eight) hours as needed for moderate pain.  [name] amLODipine (NORVASC) 10 MG tablet Take 1 tablet (10 mg total) by mouth daily.  [name] aspirin EC 81 MG tablet Take 1 tablet (81 mg total) by mouth daily.  [name] esomeprazole (NEXIUM) 40 MG capsule Take 1 capsule (40 mg total) by mouth daily at 12 noon.  . metFORMIN (GLUCOPHAGE-XR) 500 MG 24 hr tablet TAKE 2 TABLETS BY MOUTH ONCE DAILY WITH BREAKFAST  .  nitroGLYCERIN (NITROSTAT) 0.4 MG SL tablet Place 1 tablet (0.4 mg total) under the tongue every 5 (five) minutes as needed for chest pain.  . rosuvastatin (CRESTOR) 20 MG tablet Take 1 tablet (20 mg total) by mouth daily.  . tamsulosin (FLOMAX) 0.4 MG CAPS capsule Take 0.4 mg by mouth daily.  . [DISCONTINUED] metoprolol succinate (TOPROL-XL) 50 MG 24 hr tablet Take 1 tablet (50 mg total) by mouth daily. Take with or immediately following a meal.     Allergies:   Patient has no known allergies.   Social History   Tobacco Use  . Smoking status:  Never Smoker  . Smokeless tobacco: Never Used  Vaping Use  . Vaping Use: Never used  Substance Use Topics  . Alcohol use: Yes    Comment: beer or vodka occasionally  . Drug use: Yes    Types: Marijuana    Comment: 3-6 X per week     Family Hx: The patient's family history includes Diabetes in his maternal aunt, maternal grandmother, mother, and sister; Heart attack in his maternal grandmother and mother. There is no history of Colon cancer, Colon polyps, Esophageal cancer, Rectal cancer, or Stomach cancer.  Review of Systems  Cardiovascular: Negative for claudication.     EKGs/Labs/Other Test Reviewed:    EKG:  EKG is   ordered today.  The ekg ordered today demonstrates normal sinus rhythm, heart rate 67, normal axis, T wave inversions 2, 3, aVF, V3-V5, QTC 412, no change from prior tracing  Recent Labs: 06/27/2020: BUN 12; Creatinine, Ser 1.42; Potassium 4.0; Sodium 133   Recent Lipid Panel Lab Results  Component Value Date/Time   CHOL 127 06/15/2019 12:28 PM   TRIG 71 06/15/2019 12:28 PM   HDL 60 06/15/2019 12:28 PM   CHOLHDL 2.1 06/15/2019 12:28 PM   CHOLHDL 5.9 (H) 11/01/2014 10:26 AM   LDLCALC 53 06/15/2019 12:28 PM   LDLDIRECT 84 09/03/2011 09:30 AM      Risk Assessment/Calculations:      Physical Exam:    VS:  BP 120/76   Pulse 67   Ht 5\' 11"  (1.803 m)   Wt 183 lb 9.6 oz (83.3 kg)   SpO2 98%   BMI 25.61 kg/m     Wt Readings from Last 3 Encounters:  06/30/20 183 lb 9.6 oz (83.3 kg)  05/22/20 184 lb (83.5 kg)  04/24/20 180 lb 12.8 oz (82 kg)     Constitutional:      Appearance: Healthy appearance. Not in distress.  Neck:     Vascular: JVD normal.  Pulmonary:     Effort: Pulmonary effort is normal.     Breath sounds: No wheezing. No rales.  Cardiovascular:     Normal rate. Regular rhythm. Normal S1. Normal S2.     Murmurs: There is a grade 1/6 systolic murmur at the URSB.  Edema:    Peripheral edema absent.  Abdominal:     Palpations: Abdomen  is soft. There is no hepatomegaly.  Skin:    General: Skin is warm and dry.  Neurological:     General: No focal deficit present.     Mental Status: Alert and oriented to person, place and time.     Cranial Nerves: Cranial nerves are intact.          ASSESSMENT & PLAN:    1. Precordial chest pain 2. Coronary artery disease involving native coronary artery of native heart with angina pectoris ([place]) History of PCI with DES to the proximal-mid  LAD in 07/2018.  He had residual disease with moderate stenosis in a small diagonal and moderate stenosis in the proximal RCA.  Medical therapy was recommended for residual disease.  His current symptoms have typical and atypical features of ischemia.  His electrocardiogram does demonstrate inferolateral T wave inversions.  However, these are old and there has been no significant change.  It has been over a year since his last stress test.  I have recommended proceeding with an exercise Myoview.  I would like for him to do this on medical therapy.  Therefore, he will continue his beta-blocker the morning of his test.  I will increase his metoprolol succinate to 75 mg daily to see if this helps.  Of note, he had been off of metoprolol succinate for a while but his blood pressure was above target.  He feels that the right-sided discomfort may be somewhat better since he has resumed this.  Continue amlodipine, aspirin, rosuvastatin.  Follow-up with Dr. Johney Frame or me after his stress test.  3. Essential hypertension, benign The patient's blood pressure is controlled on his current regimen.  Continue current therapy.   4. Mixed hyperlipidemia LDL optimal on most recent lab work.  Continue current Rx.     Shared Decision Making/Informed Consent The risks [chest pain, shortness of breath, cardiac arrhythmias, dizziness, blood pressure fluctuations, myocardial infarction, stroke/transient ischemic attack, nausea, vomiting, allergic reaction, radiation exposure,  metallic taste sensation and life-threatening complications (estimated to be 1 in 10,000)], benefits (risk stratification, diagnosing coronary artery disease, treatment guidance) and alternatives of a nuclear stress test were discussed in detail with Adam Burton and he agrees to proceed.   Dispo:  Return in about 8 weeks (around 08/25/2020) for Routine Follow Up, w/ Dr. Johney Frame, or Richardson Dopp, PA-C.   Medication Adjustments/Labs and Tests Ordered: Current medicines are reviewed at length with the patient today.  Concerns regarding medicines are outlined above.  Tests Ordered: Orders Placed This Encounter  Procedures  . Cardiac Stress Test: Informed Consent Details: Physician/Practitioner Attestation; Transcribe to consent form and obtain patient signature  . Myocardial Perfusion Imaging  . EKG 12-Lead   Medication Changes: Meds ordered this encounter  Medications  . metoprolol succinate (TOPROL-XL) 50 MG 24 hr tablet    Sig: Take 1.5 tablets (75 mg total) by mouth daily. Take with or immediately following a meal.    Dispense:  120 tablet    Refill:  3    Signed, Richardson Dopp, PA-C  06/30/2020 11:16 AM    [facility] [place], [place], [place]  [address] Phone: [phone]; Fax: [phone]

## 2020-06-30 ENCOUNTER — Ambulatory Visit (INDEPENDENT_AMBULATORY_CARE_PROVIDER_SITE_OTHER): Payer: Medicare Other | Admitting: Physician Assistant

## 2020-06-30 ENCOUNTER — Other Ambulatory Visit: Payer: Self-pay

## 2020-06-30 ENCOUNTER — Encounter: Payer: Self-pay | Admitting: Physician Assistant

## 2020-06-30 VITALS — BP 120/76 | HR 67 | Ht 71.0 in | Wt 183.6 lb

## 2020-06-30 DIAGNOSIS — I10 Essential (primary) hypertension: Secondary | ICD-10-CM

## 2020-06-30 DIAGNOSIS — R072 Precordial pain: Secondary | ICD-10-CM | POA: Diagnosis not present

## 2020-06-30 DIAGNOSIS — E782 Mixed hyperlipidemia: Secondary | ICD-10-CM

## 2020-06-30 DIAGNOSIS — I25119 Atherosclerotic heart disease of native coronary artery with unspecified angina pectoris: Secondary | ICD-10-CM

## 2020-06-30 DIAGNOSIS — I2510 Atherosclerotic heart disease of native coronary artery without angina pectoris: Secondary | ICD-10-CM

## 2020-06-30 MED ORDER — METOPROLOL SUCCINATE ER 50 MG PO TB24: 75.0000 mg | ORAL_TABLET | Freq: Every day | ORAL | 3 refills | Status: DC

## 2020-06-30 NOTE — Patient Instructions (Addendum)
Medication Instructions:  Your physician has recommended you make the following change in your medication:   1.  Increase Metoprolol one and one half tablet by mouth ( 75 mg) daily.   *If you need a refill on your cardiac medications before your next appointment, please call your pharmacy*   Lab Work: -None  If you have labs (blood work) drawn today and your tests are completely normal, you will receive your results only by: [name] MyChart Message (if you have MyChart) OR . A paper copy in the mail If you have any lab test that is abnormal or we need to change your treatment, we will call you to review the results.   Testing/Procedures: You are scheduled for a Myocardial Perfusion Imaging Study on Thursday, May 12 at 7:45 am   .   Please arrive 15 minutes prior to your appointment time for registration and insurance purposes.   The test will take approximately 3 to 4 hours to complete; you may bring reading material. If someone comes with you to your appointment, they will need to remain in the main lobby due to limited space in the testing area.   How to prepare for your Myocardial Perfusion test:   Do not eat or drink 3 hours prior to your test, except you may have water.    Do not consume products containing caffeine (regular or decaffeinated) 12 hours prior to your test (ex: coffee, chocolate, soda, tea)   Do bring a list of your current medications with you. If not listed below, you may take your medications as normal.   Bring any held medication to your appointment, as you may be required to take it once the test is complete.   Do wear comfortable clothes (no dresses or overalls) and walking shoes. Tennis shoes are preferred. No heels or open toed shoes.  Do not wear cologne, perfume, aftershave or lotions (deodorant is allowed).   If these instructions are not followed, you test will have to be rescheduled.   Please report to [address] [place] [address] for your test.  If you have questions or concerns about your appointment, please call the Nuclear Lab at [phone].  If you cannot keep your appointment, please provide 24 hour notification to the Nuclear lab to avoid a possible $50 charge to your account.     Follow-Up: At [facility], you and your health needs are our priority.  As part of our continuing mission to provide you with exceptional heart care, we have created designated Provider Care Teams.  These Care Teams include your primary Cardiologist (physician) and Advanced Practice Providers (APPs -  Physician Assistants and Nurse Practitioners) who all work together to provide you with the care you need, when you need it.  We recommend signing up for the patient portal called "MyChart".  Sign up information is provided on this After Visit Summary.  MyChart is used to connect with patients for Virtual Visits (Telemedicine).  Patients are able to view lab/test results, encounter notes, upcoming appointments, etc.  Non-urgent messages can be sent to your provider as well.   To learn more about what you can do with MyChart, go to [email or web address].    Your next appointment:   8 week(s)  The format for your next appointment:   In Person  Provider:   You may see Freada Bergeron, MD or one of the following Advanced Practice Providers on your designated Care Team:    Richardson Dopp, [place] on Tuesday,  June 28 @ 11:15 am.      Other Instructions -None

## 2020-07-01 IMAGING — DX DG SHOULDER 2+V*R*
3 series · 3 of 3 positions shown · non-contrast
Comparison: None.

CLINICAL DATA: Right shoulder pain for 1 month.

EXAM:
RIGHT SHOULDER - 2+ VIEW

[dg shoulder right (1 of 3)]
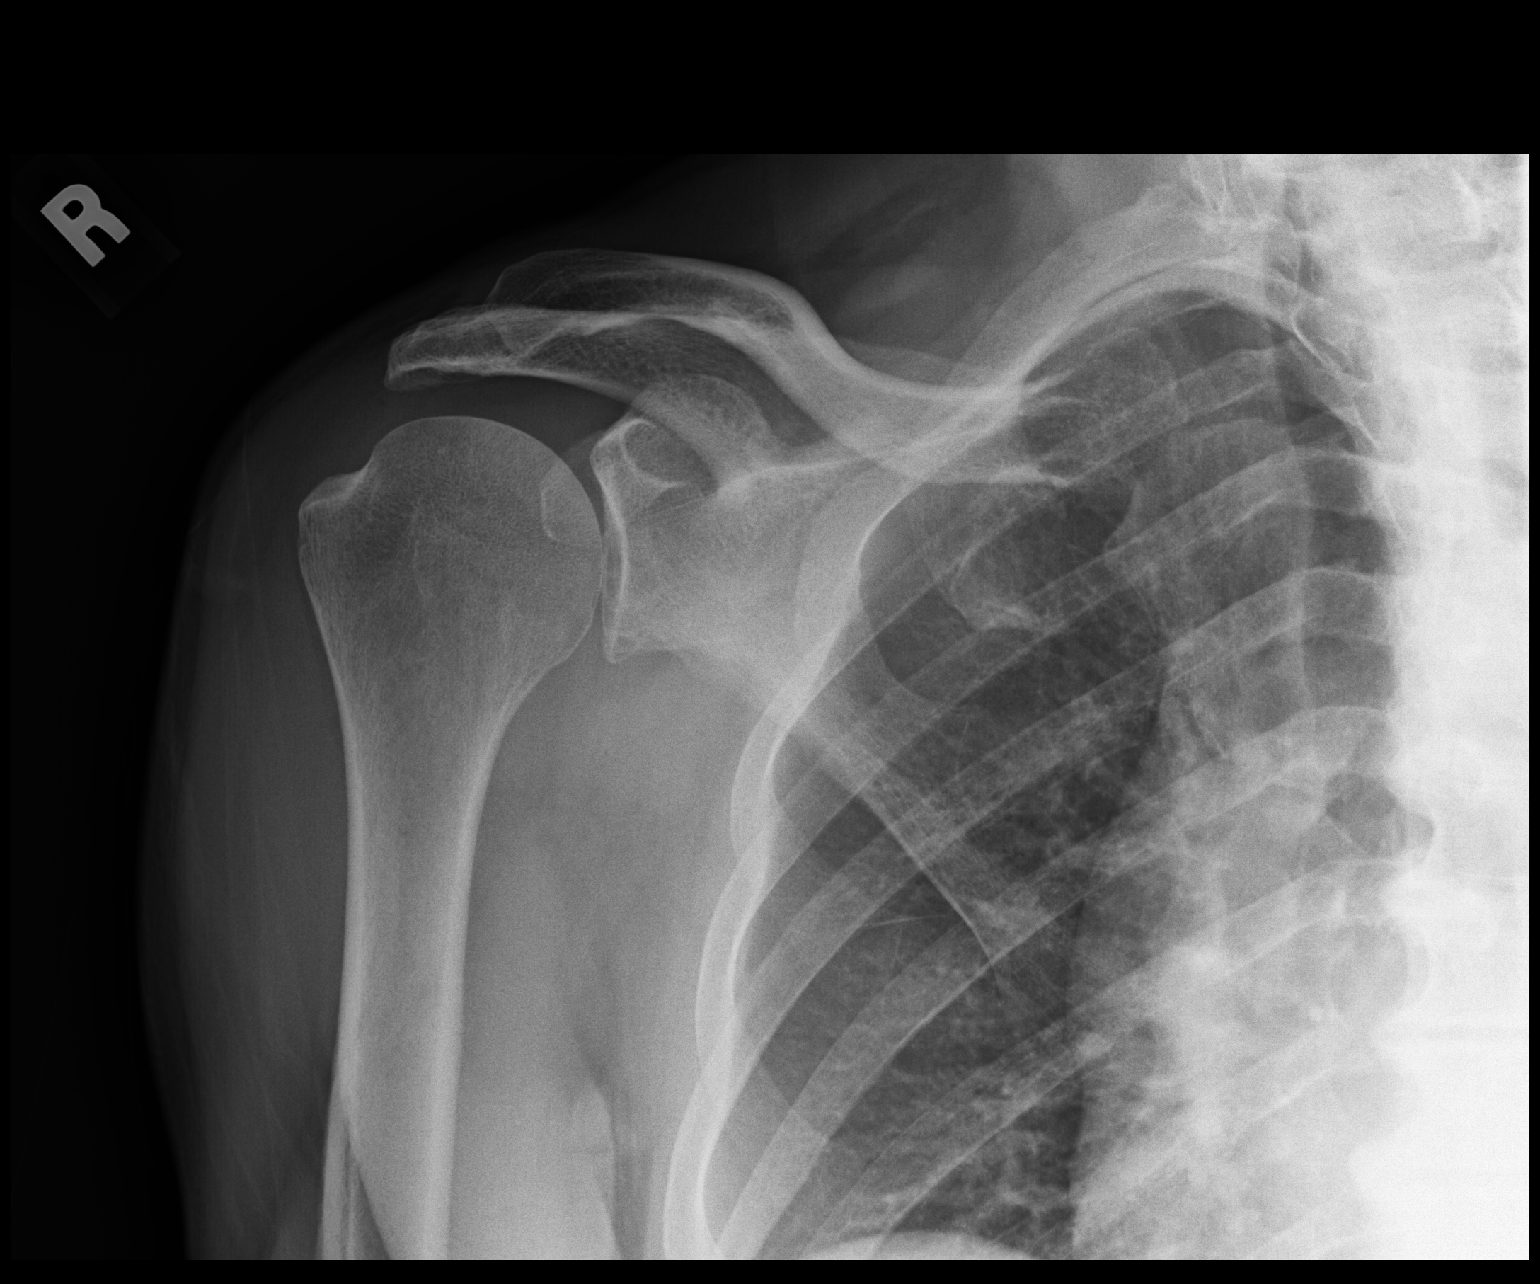

[dg shoulder right (2 of 3)]
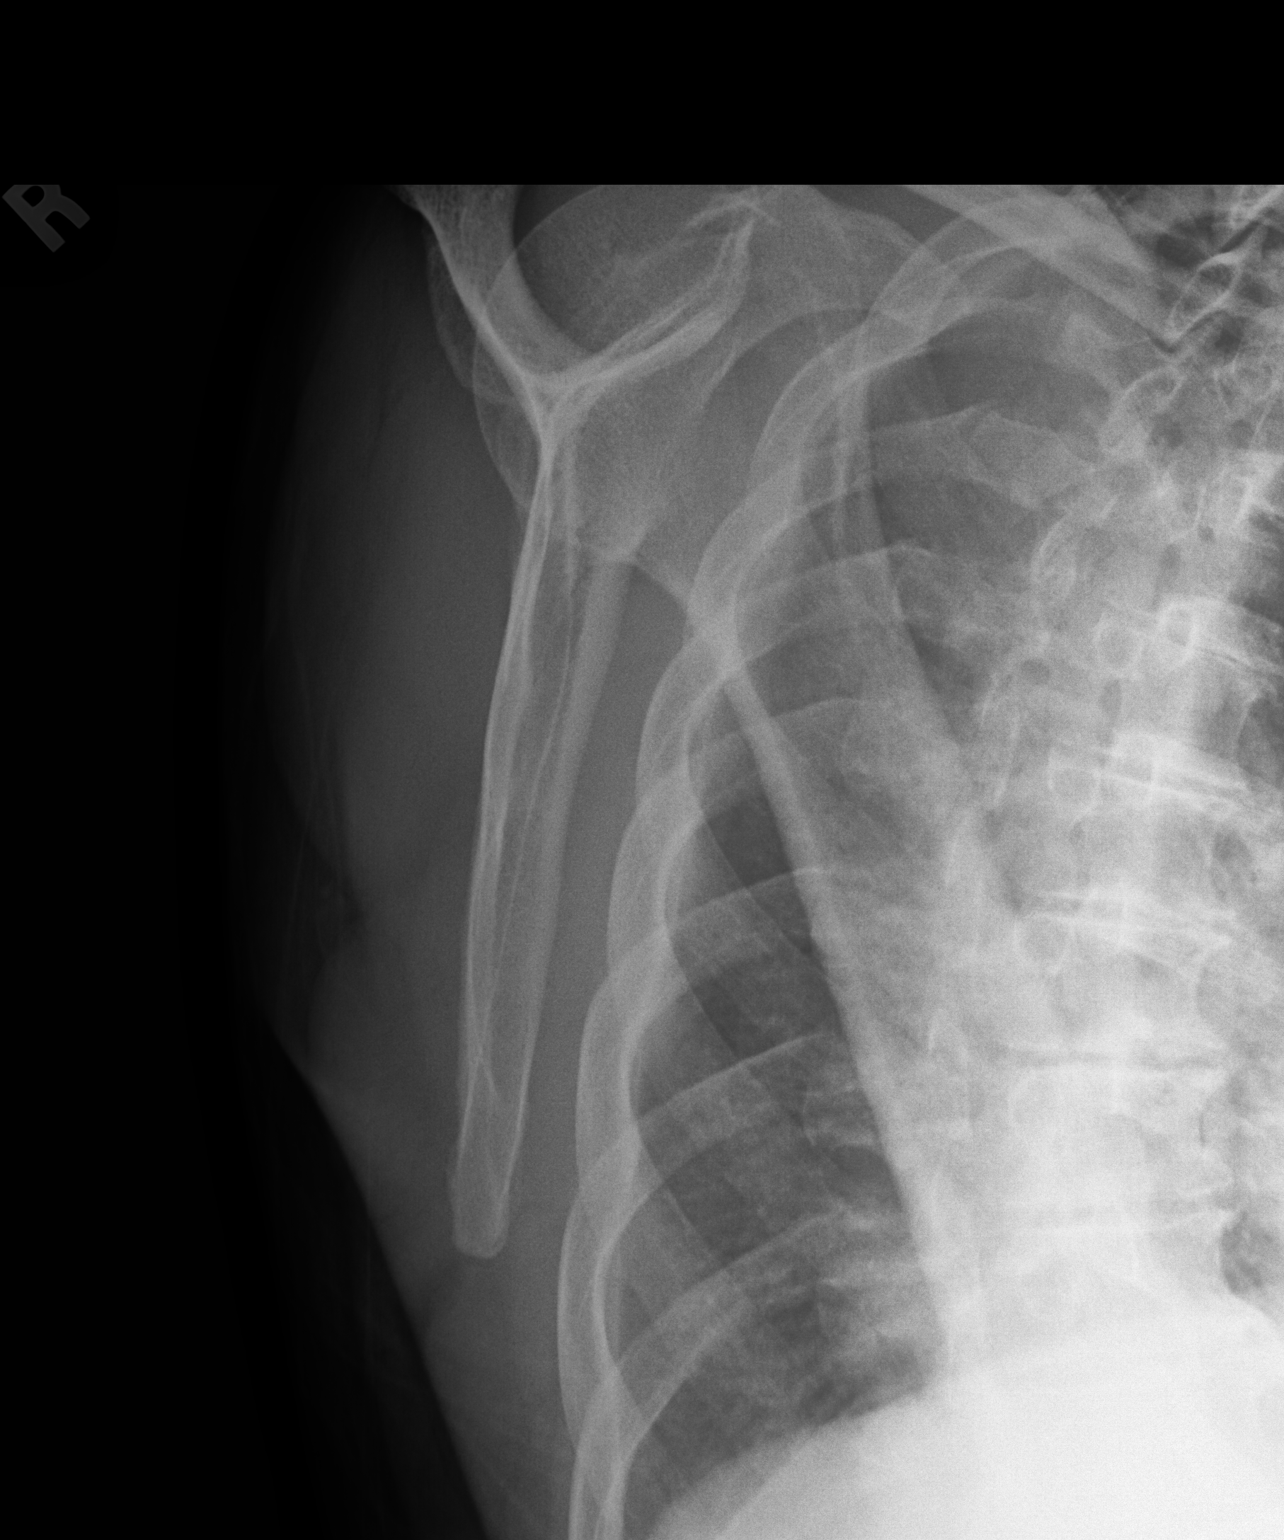

[dg shoulder right (3 of 3)]
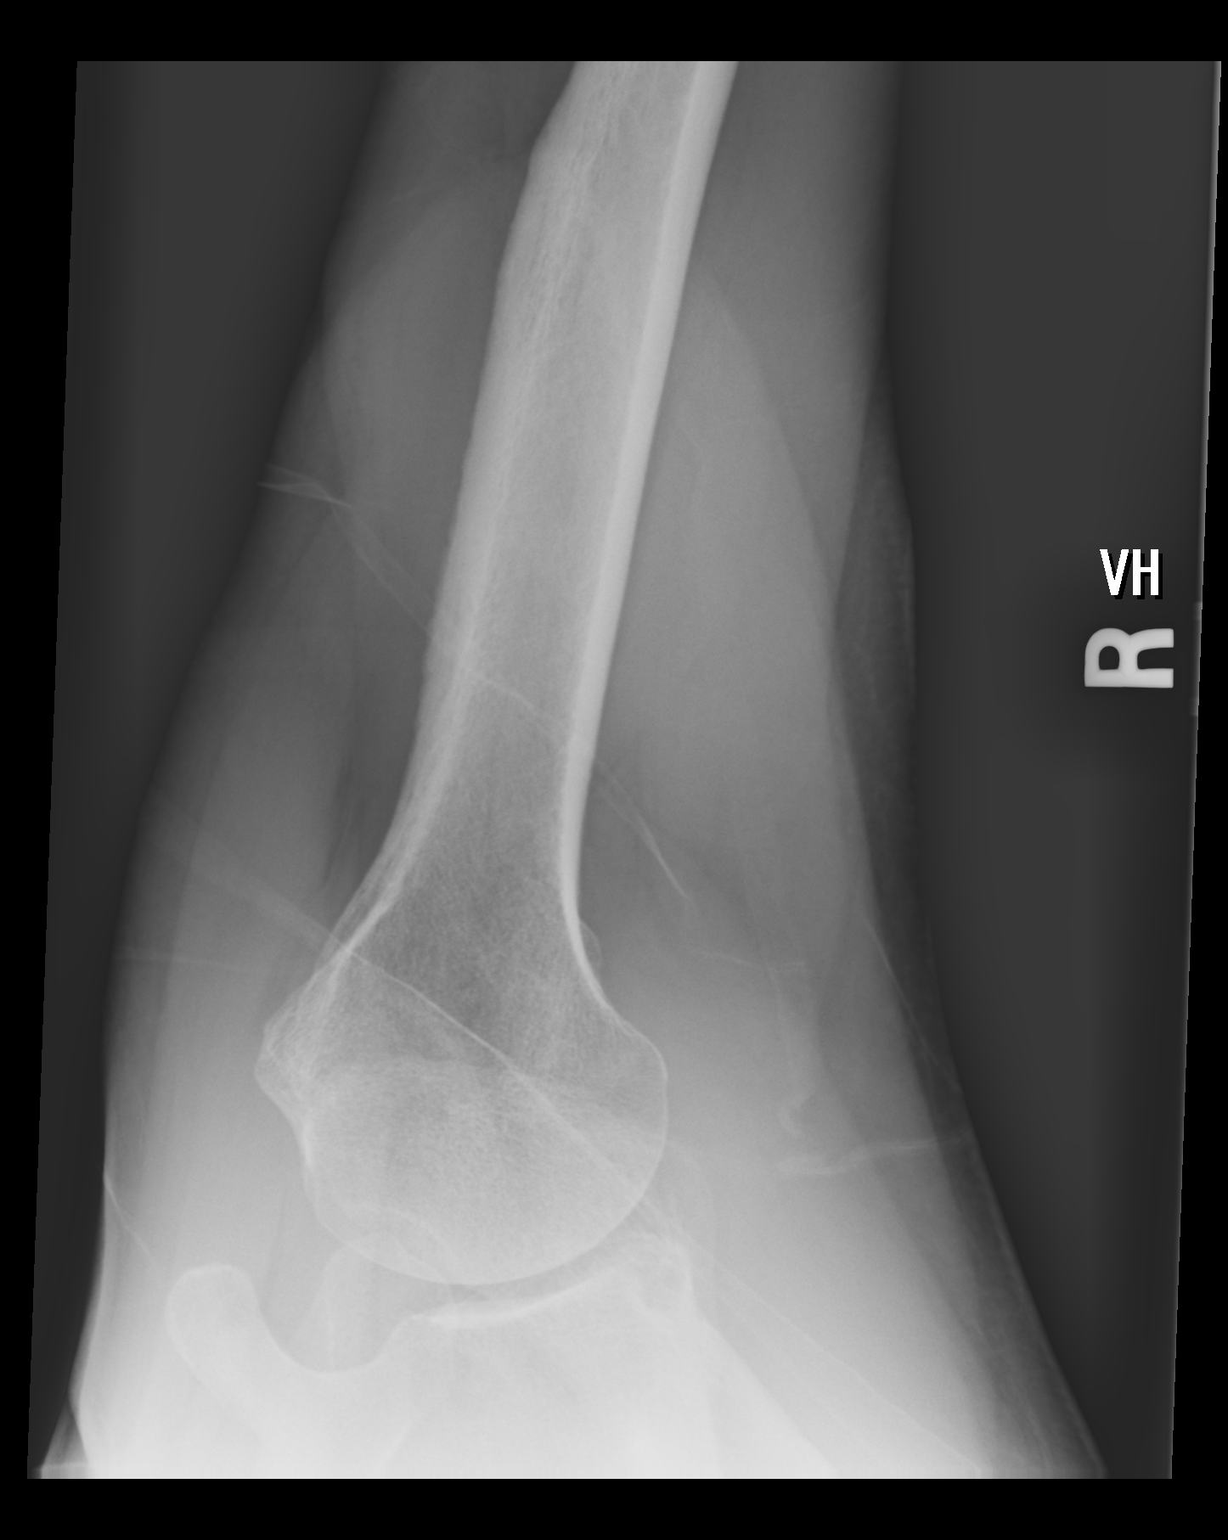

[3 of 3 positions shown; findings below may reference images not displayed]

FINDINGS: There is no evidence of fracture or dislocation. There is no
evidence of arthropathy or other focal bone abnormality. Soft
tissues are unremarkable.
IMPRESSION: Negative.

## 2020-07-05 ENCOUNTER — Telehealth: Payer: Self-pay

## 2020-07-05 NOTE — Telephone Encounter (Signed)
Per DPR, Mr. Adam Burton is only to be spoken to. Asked Adam Burton to contact us back, that we needed to speak with him. S.Ziah Turvey EMTP

## 2020-07-06 ENCOUNTER — Encounter (HOSPITAL_COMMUNITY): Payer: Medicare Other

## 2020-07-14 ENCOUNTER — Telehealth (HOSPITAL_COMMUNITY): Payer: Self-pay | Admitting: *Deleted

## 2020-07-14 NOTE — Telephone Encounter (Signed)
Left message on voicemail per DPR in reference to upcoming appointment scheduled on 07/18/20 at 8:00 with detailed instructions given per Myocardial Perfusion Study Information Sheet for the test. LM to arrive 15 minutes early, and that it is imperative to arrive on time for appointment to keep from having the test rescheduled. If you need to cancel or reschedule your appointment, please call the office within 24 hours of your appointment. Failure to do so may result in a cancellation of your appointment, and a $50 no show fee. Phone number given for call back for any questions.

## 2020-07-18 ENCOUNTER — Encounter (HOSPITAL_COMMUNITY): Payer: Medicare Other

## 2020-07-18 ENCOUNTER — Encounter (HOSPITAL_COMMUNITY): Payer: Self-pay

## 2020-07-25 ENCOUNTER — Encounter (HOSPITAL_COMMUNITY): Payer: Self-pay | Admitting: Physician Assistant

## 2020-08-07 NOTE — Progress Notes (Signed)
Patient ID: Adam Burton                 DOB: [birth year]                      MRN: [id number]     HPI: Shanti Eichel is a 70 y.o. male referred by Dr. Johney Frame to HTN clinic. PMH is significant for CAD s/p prox-mid LAD DES in 07/2017, HTN, HLD, and T2DM. Pt last seen in HTN clinic on 06/27/20 and BP was 144/100, however did not take morning HTN medications prior to visit. Reported adherence with Toprol XL 50 mg daily, however it was last filled for a 90-day supply 01/29/20. Reported home BP averaged between 110s-120s/64-70s. Pt encouraged to pick up Toprol XL 50 mg daily and continue all other medications. Additionally, pt reported infrequent chest pain on the right side and SOB with exertion.   Pt last seen by cardiology on 06/30/20 and BP was 120/76. Pt was seen for evaluation due to reporting some R sided chest pain over the past 1 to 2 weeks. Reported chest pain discomfort was similar to what he had on the left side of his chest prior to his PCI. Cardiology recommended an exercise Myoview and increased metoprolol succinate (Toprol XL) from 50 to 75 mg daily.  Patient presents today in good spirits. Reports taking metoprolol succinate (Toprol XL) 50 mg daily instead of 75 mg daily as prescribed. Reports medication adherence with amlodipine 10 mg daily. Denies chest pain/tightness and SOB since restarting metoprolol succinate. Denies headaches, dizziness, blurred vision, and LE swelling. Reports was out of town (in [place]) due to death in the family. Reports no missed doses while out of town, however was not checking BP.   Current HTN meds: amlodipine 10 mg daily, Toprol XL 75 mg daily Previously tried: HCTZ 25 mg daily, lisinopril 20 mg daily BP goal: <130/80 mmHg  Family History: Diabetes in his maternal aunt, maternal grandmother, mother, and sister; Heart attack in his maternal grandmother and mother   Social History: denies tobacco use, marijuana 2x daily, alcohol occasionally   Diet:  Occupation Chef -Breakfast: hot chocolate, bread and cheese -Lunch: usually skip -Dinner: pasta, rice, beans, stew, spinach rice, fish,  rarely chicken,, not much red meat, can cut back on salt -Snacks: peanuts and cranberry -Drinks: liquor occasionally, hard time drinking water, some juice  Exercise: walks long distances 2-3x/week  Home BP readings: none  Labs: 06/27/20: K 4, Scr1.42, eGFR 53, Na 133 05/02/20: K 5.3, Scr 1.31, eGFR 58, Na 139  Wt Readings from Last 3 Encounters:  06/30/20 183 lb 9.6 oz (83.3 kg)  05/22/20 184 lb (83.5 kg)  04/24/20 180 lb 12.8 oz (82 kg)   BP Readings from Last 3 Encounters:  08/08/20 118/74  06/30/20 120/76  06/27/20 (!) 144/100   Pulse Readings from Last 3 Encounters:  08/08/20 (!) 58  06/30/20 67  06/27/20 85    Renal function: CrCl cannot be calculated (Patient's most recent lab result is older than the maximum 21 days allowed.).  Past Medical History:  Diagnosis Date   Diabetes mellitus without complication ([place])    Hypercholesteremia    Hypertension    Renal insufficiency 08/21/2012    Current Outpatient Medications on File Prior to Visit  Medication Sig Dispense Refill   acetaminophen (TYLENOL) 500 MG tablet Take 2 tablets (1,000 mg total) by mouth every 8 (eight) hours as needed for moderate pain. 30 tablet 0   amLODipine (  NORVASC) 10 MG tablet Take 1 tablet (10 mg total) by mouth daily. 180 tablet 3   aspirin EC 81 MG tablet Take 1 tablet (81 mg total) by mouth daily. 90 tablet 3   esomeprazole (NEXIUM) 40 MG capsule Take 1 capsule (40 mg total) by mouth daily at 12 noon. 30 capsule 11   metFORMIN (GLUCOPHAGE-XR) 500 MG 24 hr tablet TAKE 2 TABLETS BY MOUTH ONCE DAILY WITH BREAKFAST 180 tablet 3   metoprolol succinate (TOPROL-XL) 50 MG 24 hr tablet Take 1.5 tablets (75 mg total) by mouth daily. Take with or immediately following a meal. 120 tablet 3   nitroGLYCERIN (NITROSTAT) 0.4 MG SL tablet Place 1 tablet (0.4 mg total) under  the tongue every 5 (five) minutes as needed for chest pain. 30 tablet 3   rosuvastatin (CRESTOR) 20 MG tablet Take 1 tablet (20 mg total) by mouth daily. 90 tablet 3   tamsulosin (FLOMAX) 0.4 MG CAPS capsule Take 0.4 mg by mouth daily.     No current facility-administered medications on file prior to visit.    No Known Allergies   Assessment/Plan:  1. Hypertension - BP at goal <130/80 mmHg. Medication adherence appears optimal. Advised patient to start taking metoprolol succinate 75 mg daily (1.5 tablets) as prescribed. Patient verbalized understanding. Continued amlodipine 10 mg daily. Informed patient the cardiology office has been trying to contact him to schedule his Myoview imaging procedure. Patient verbalized chest pain and SOB has resolved since restarting metoprolol and that he believes he does not need the imaging procedure. Informed patient to contact the cardiology office and to keep his scheduled appointment with Richardson Dopp in 2 weeks. Patient verbalized understanding. Follow-up appointment scheduled in HTN clinic in 8 weeks.  Addendum: Given low HR 58 since restarting metoprolol succinate 50 mg daily, recommend continuing metoprolol succinate 50 mg daily. Adam update medication profile to reflect this change. Left voicemail requesting patient to call back regarding this medication update. Adam call again tomorrow.  Lorel Monaco, PharmD, [facility] [address], [place], [place] [address] Phone: [phone]; Fax: [phone]

## 2020-08-08 ENCOUNTER — Encounter: Payer: Self-pay | Admitting: Pharmacist

## 2020-08-08 ENCOUNTER — Other Ambulatory Visit: Payer: Self-pay

## 2020-08-08 ENCOUNTER — Ambulatory Visit (INDEPENDENT_AMBULATORY_CARE_PROVIDER_SITE_OTHER): Payer: Medicare Other | Admitting: Pharmacist

## 2020-08-08 ENCOUNTER — Telehealth (HOSPITAL_COMMUNITY): Payer: Self-pay | Admitting: Physician Assistant

## 2020-08-08 VITALS — BP 118/74 | HR 58

## 2020-08-08 DIAGNOSIS — I10 Essential (primary) hypertension: Secondary | ICD-10-CM | POA: Diagnosis not present

## 2020-08-08 MED ORDER — METOPROLOL SUCCINATE ER 50 MG PO TB24: 50.0000 mg | ORAL_TABLET | Freq: Every day | ORAL | 3 refills | Status: DC

## 2020-08-08 NOTE — Telephone Encounter (Signed)
Just an FYI. We have made several attempts to contact this patient including sending a letter to schedule or reschedule their Myoview. We will be removing the patient from the echo/nuc WQ.   07/18/20 NO SHOWED-MAILED LETTER [facility]     Thank you

## 2020-08-08 NOTE — Patient Instructions (Signed)
It was nice to see you today!  Your goal blood pressure is <130/80 mmHg.   Medication Changes:  Continue amlodipine 10 mg daily  Continue metoprolol succinate (Toprol XL) 75 mg daily (1.5 tablets daily)  Monitor blood pressure at home daily and keep a log (on your phone or piece of paper) to bring with you to your next visit. Write down date, time, blood pressure and pulse.  Keep up the good work with diet and exercise. Aim for a diet full of vegetables, fruit and lean meats (chicken, [place], fish). Try to limit salt intake by eating fresh or frozen vegetables (instead of canned), rinse canned vegetables prior to cooking and do not add any additional salt to meals.

## 2020-08-09 ENCOUNTER — Telehealth: Payer: Self-pay | Admitting: Pharmacist

## 2020-08-09 NOTE — Telephone Encounter (Signed)
Contacted patient regarding medication dose adjustment. Given low HR 58 (previously 60-80s prior to restarting metoprolol succinate 50 mg daily), recommend pt to continue metoprolol succinate 50 mg daily even though prescribed 75 mg daily. Medication profile updated to reflect metoprolol succinate 50 mg daily.  Left voicemail requesting call back regarding this medication update. Attempt x2. Will call again tomorrow.

## 2020-08-10 NOTE — Telephone Encounter (Signed)
Contacted patient regarding medication dose adjustment. Informed patient to continue metoprolol succinate 50 mg daily. Patient verbalized understanding. Medication profile updated.

## 2020-08-21 NOTE — Progress Notes (Deleted)
Cardiology Office Note:    Date:  08/21/2020   ID:  Adam Burton, DOB Nov 02, 1950, MRN [id number]  PCP:  [name], MD   [facility] Providers Cardiologist:  Freada Bergeron, MD { Click to update primary MD,subspecialty MD or APP then REFRESH:1}  ***  Referring MD: [name], MD   Chief Complaint:  No chief complaint on file.    Patient Profile:    Adam Burton is a 70 y.o. male with:  Coronary artery disease S/p DES to prox to mid LAD in 07/2018 Echocardiogram 12/21: EF 61, mild AI Myoview 2/21: low risk Hypertension Hyperlipidemia Diabetes mellitus  20171  Mild AI Echocardiogram 12/21    Prior CV studies: Echocardiogram 02/24/2020 ([facility]):  EF 61, Gr 1 DD, mild AI   Lexiscan (Walking with mod Bruce)Tetrofosmin Stress Test  04/05/2019 ([facility]): Diaph atten, no ischemia or scar, EF 39; Low risk    CORONARY STENT INTERVENTION, CORONARY STENT INTERVENTION 08/04/2018 Narrative LM: Normal LAD: Prox-mid 75% stenosis. CTA FFR 0.75. Diag 1 small vessel with 70% disease LCx: Normal RCA: Prox 60% stenosis. CTA FFR 0.85 Successful percutaneous coronary intervention prox-mid LAD PTCA and stent placement Resolute Onyx 3.5 X 22 mm drug-eluting stent Recommend medical management for small vessel diag disease, and RCA nonobstructive disease.   CT CORONARY MORPH W/CTA COR W/SCORE W/CA W/CM &/OR WO/CM 01/28/2018 Addendum 01/29/2018 11:43 AM IMPRESSION: 1. CT FFR analysis showed severe stenosis in the proximal LAD. A cardiac catheterization is recommended. IMPRESSION: 1. Coronary calcium score of 221. This was 81 percentile for age and sex matched control. 2. Normal coronary origin with right dominance. 3. Severe plaque in the proximal LAD with stenosis > 70%, moderate CAD in the proximal to mid RCA. A cardiac catheterization is recommended.   Stress Echocardiogram 1/81/8 Normal; normal EF at rest   Echocardiogram  12/02/13 GLS -19.4%, EF 55-60, no RWMA, mild LAE   History of Present Illness: Adam Burton was last seen in 5/22 for the evaluation of chest pain.  He was scheduled for a Myoview but never showed up for this.  He returns for f/u***        Past Medical History:  Diagnosis Date   Diabetes mellitus without complication ([place])    Hypercholesteremia    Hypertension    Renal insufficiency 08/21/2012    Current Medications: No outpatient medications have been marked as taking for the 08/22/20 encounter (Appointment) with Richardson Dopp T, PA-C.     Allergies:   Patient has no known allergies.   Social History   Tobacco Use   Smoking status: Never   Smokeless tobacco: Never  Vaping Use   Vaping Use: Never used  Substance Use Topics   Alcohol use: Yes    Comment: beer or vodka occasionally   Drug use: Yes    Types: Marijuana    Comment: 3-6 X per week     Family Hx: The patient's family history includes Diabetes in his maternal aunt, maternal grandmother, mother, and sister; Heart attack in his maternal grandmother and mother. There is no history of Colon cancer, Colon polyps, Esophageal cancer, Rectal cancer, or Stomach cancer.  ROS   EKGs/Labs/Other Test Reviewed:    EKG:  EKG is *** ordered today.  The ekg ordered today demonstrates ***  Recent Labs: 06/27/2020: BUN 12; Creatinine, Ser 1.42; Potassium 4.0; Sodium 133   Recent Lipid Panel Lab Results  Component Value Date/Time   CHOL 127 06/15/2019 12:28 PM   TRIG  71 06/15/2019 12:28 PM   HDL 60 06/15/2019 12:28 PM   LDLCALC 53 06/15/2019 12:28 PM   LDLDIRECT 84 09/03/2011 09:30 AM      Risk Assessment/Calculations:   {Does this patient have ATRIAL FIBRILLATION?:[id number]}  Physical Exam:    VS:  There were no vitals taken for this visit.    Wt Readings from Last 3 Encounters:  06/30/20 183 lb 9.6 oz (83.3 kg)  05/22/20 184 lb (83.5 kg)  04/24/20 180 lb 12.8 oz (82 kg)     Physical Exam ***     ASSESSMENT  & PLAN:    *** 1. Precordial chest pain 2. Coronary artery disease involving native coronary artery of native heart with angina pectoris [facility]) History of PCI with DES to the proximal-mid LAD in 07/2018.  He had residual disease with moderate stenosis in a small diagonal and moderate stenosis in the proximal RCA.  Medical therapy was recommended for residual disease.  His current symptoms have typical and atypical features of ischemia.  His electrocardiogram does demonstrate inferolateral T wave inversions.  However, these are old and there has been no significant change.  It has been over a year since his last stress test.  I have recommended proceeding with an exercise Myoview.  I would like for him to do this on medical therapy.  Therefore, he will continue his beta-blocker the morning of his test.  I will increase his metoprolol succinate to 75 mg daily to see if this helps.  Of note, he had been off of metoprolol succinate for a while but his blood pressure was above target.  He feels that the right-sided discomfort may be somewhat better since he has resumed this.  Continue amlodipine, aspirin, rosuvastatin.  Follow-up with Dr. Johney Frame or me after his stress test.   3. Essential hypertension, benign The patient's blood pressure is controlled on his current regimen.  Continue current therapy.   4. Mixed hyperlipidemia LDL optimal on most recent lab work.  Continue current Rx.   {Are you ordering a CV Procedure (e.g. stress test, cath, DCCV, TEE, etc)?   Press F2        :[id number]}    Dispo:  No follow-ups on file.   Medication Adjustments/Labs and Tests Ordered: Current medicines are reviewed at length with the patient today.  Concerns regarding medicines are outlined above.  Tests Ordered: No orders of the defined types were placed in this encounter.  Medication Changes: No orders of the defined types were placed in this encounter.   Signed, Richardson Dopp, PA-C  08/21/2020 9:36 PM     [facility] [place], [place], [place]  [address] Phone: [phone]; Fax: [phone]

## 2020-08-22 ENCOUNTER — Ambulatory Visit: Payer: Medicare Other | Admitting: Physician Assistant

## 2020-10-03 ENCOUNTER — Ambulatory Visit: Payer: Medicare Other

## 2020-10-09 NOTE — Progress Notes (Signed)
Patient ID: Adam Burton                 DOB: 1950/04/15                      MRN: [id number]    HPI: Kaylen Nghiem is a 70 y.o. male referred by Dr. Johney Frame to HTN clinic. PMH is significant for CAD s/p prox-mid LAD DES in 07/2018, HTN, HLD, and T2DM. Last seen by cardiology 06/30/20 for evaluation of chest pain and Toprol XL was increased to 75 mg. Patient has seen HTN clinic monthly since April. At last visit on 08/08/20 and BP 118/74 was at goal. Reported only taking 50 mg of Toprol XL but since HR 58, continued 50 mg.   Today, patient arrives in good spirits. Continues on amlodipine 10 mg daily and metoprolol succinate 50 mg daily with no adverse effects. He takes both in the morning around 10-11am. Reports that he hasn't checked his BP in the last week due to being out of town, but the numbers he saw prior to this were unchanged from prior. Unable to give me specific numbers but says he keeps a list at home that he forgot to bring today. He brought his pill box with him when he went out of town so he has not missed any doses. Denies dizziness, headaches, blurred vision, or swelling. No changes in diet/exercise since last visit. When asked why he stopped taking hydrochlorothiazide and lisinopril in the past, stated that he did not have any allergic reactions or adverse effects but they were just stopped by his doctor, he wasn't sure why.   Current HTN meds: amlodipine 10 mg daily (AM), Toprol XL 50 mg daily (AM) Previously tried: HCTZ 25 mg daily, lisinopril 20 mg daily BP goal: <130/80 mmHg  Family History: Diabetes in his maternal aunt, maternal grandmother, mother, and sister; Heart attack in his maternal grandmother and mother   Social History: denies tobacco use, marijuana 2x daily, alcohol occasionally   Diet: Occupation: Chef -Breakfast: hot chocolate, bread and cheese -Lunch: usually skip -Dinner: pasta, rice, beans, stew, spinach rice, fish,  rarely chicken,, not much red meat, can  cut back on salt -Snacks: peanuts and cranberry -Drinks: liquor occasionally, hard time drinking water, some juice  Exercise: walks long distances 2-3x/week  Home BP readings: none  Labs: 06/27/20: K 4, Scr1.42, eGFR 53, Na 133 05/02/20: K 5.3, Scr 1.31, eGFR 58, Na 139  Wt Readings from Last 3 Encounters:  06/30/20 183 lb 9.6 oz (83.3 kg)  05/22/20 184 lb (83.5 kg)  04/24/20 180 lb 12.8 oz (82 kg)   BP Readings from Last 3 Encounters:  10/10/20 108/70  08/08/20 118/74  06/30/20 120/76   Pulse Readings from Last 3 Encounters:  10/10/20 65  08/08/20 (!) 58  06/30/20 67    Renal function: CrCl cannot be calculated (Patient's most recent lab result is older than the maximum 21 days allowed.).  Past Medical History:  Diagnosis Date   Diabetes mellitus without complication ([place])    Hypercholesteremia    Hypertension    Renal insufficiency 08/21/2012    Current Outpatient Medications on File Prior to Visit  Medication Sig Dispense Refill   acetaminophen (TYLENOL) 500 MG tablet Take 2 tablets (1,000 mg total) by mouth every 8 (eight) hours as needed for moderate pain. 30 tablet 0   amLODipine (NORVASC) 10 MG tablet Take 1 tablet (10 mg total) by mouth daily. 180 tablet 3  aspirin EC 81 MG tablet Take 1 tablet (81 mg total) by mouth daily. 90 tablet 3   esomeprazole (NEXIUM) 40 MG capsule Take 1 capsule (40 mg total) by mouth daily at 12 noon. 30 capsule 11   metFORMIN (GLUCOPHAGE-XR) 500 MG 24 hr tablet TAKE 2 TABLETS BY MOUTH ONCE DAILY WITH BREAKFAST 180 tablet 3   metoprolol succinate (TOPROL-XL) 50 MG 24 hr tablet Take 1 tablet (50 mg total) by mouth daily. Take with or immediately following a meal. 90 tablet 3   nitroGLYCERIN (NITROSTAT) 0.4 MG SL tablet Place 1 tablet (0.4 mg total) under the tongue every 5 (five) minutes as needed for chest pain. 30 tablet 3   rosuvastatin (CRESTOR) 20 MG tablet Take 1 tablet (20 mg total) by mouth daily. 90 tablet 3   tamsulosin (FLOMAX)  0.4 MG CAPS capsule Take 0.4 mg by mouth daily.     No current facility-administered medications on file prior to visit.    No Known Allergies   Assessment/Plan:  1. Hypertension - BP today in clinic of 108/70 is at goal <130/80 mmHg. Heart rate is normal at 65 bpm. He denies any adverse effects or symptoms of hypotension. Medication adherence is appropriate. Will continue current medications: amlodipine 10 mg daily and metoprolol succinate 50 mg daily. Reinforced lifestyle modifications to help improve BP. If additional BP in the future, could consider addition of an ARB given history of T2DM and CKD. He will follow up with Richardson Dopp in October.   Rebbeca Paul, PharmD PGY2 Ambulatory Care Pharmacy Resident 10/10/2020 4:00 PM

## 2020-10-10 ENCOUNTER — Ambulatory Visit (INDEPENDENT_AMBULATORY_CARE_PROVIDER_SITE_OTHER): Payer: Medicare Other | Admitting: Student-PharmD

## 2020-10-10 ENCOUNTER — Other Ambulatory Visit: Payer: Self-pay

## 2020-10-10 VITALS — BP 108/70 | HR 65

## 2020-10-10 DIAGNOSIS — I10 Essential (primary) hypertension: Secondary | ICD-10-CM

## 2020-10-10 NOTE — Patient Instructions (Signed)
It was nice to see you today!  Your goal blood pressure is less than 130/80 mmHg. In clinic, your blood pressure was 108/70 mmHg.  Medication Changes: Continue amlodipine 10 mg daily and Toprol 50 mg daily  Monitor blood pressure at home daily and keep a log (on your phone or piece of paper) to bring with you to your next visit. Write down date, time, blood pressure and pulse.  Keep up the good work with diet and exercise. Aim for a diet full of vegetables, fruit and lean meats (chicken, [place], fish). Try to limit salt intake by eating fresh or frozen vegetables (instead of canned), rinse canned vegetables prior to cooking and do not add any additional salt to meals.

## 2020-10-23 DIAGNOSIS — Z955 Presence of coronary angioplasty implant and graft: Secondary | ICD-10-CM | POA: Diagnosis not present

## 2020-10-23 DIAGNOSIS — E119 Type 2 diabetes mellitus without complications: Secondary | ICD-10-CM | POA: Diagnosis not present

## 2020-10-23 DIAGNOSIS — Z008 Encounter for other general examination: Secondary | ICD-10-CM | POA: Diagnosis not present

## 2020-10-23 DIAGNOSIS — G8929 Other chronic pain: Secondary | ICD-10-CM | POA: Diagnosis not present

## 2020-10-23 DIAGNOSIS — K219 Gastro-esophageal reflux disease without esophagitis: Secondary | ICD-10-CM | POA: Diagnosis not present

## 2020-10-23 DIAGNOSIS — I2510 Atherosclerotic heart disease of native coronary artery without angina pectoris: Secondary | ICD-10-CM | POA: Diagnosis not present

## 2020-10-23 DIAGNOSIS — Z0000 Encounter for general adult medical examination without abnormal findings: Secondary | ICD-10-CM | POA: Diagnosis not present

## 2020-10-23 DIAGNOSIS — I10 Essential (primary) hypertension: Secondary | ICD-10-CM | POA: Diagnosis not present

## 2020-10-31 ENCOUNTER — Ambulatory Visit: Payer: Medicare Other | Admitting: Family Medicine

## 2020-11-14 ENCOUNTER — Other Ambulatory Visit: Payer: Self-pay

## 2020-11-14 ENCOUNTER — Ambulatory Visit (INDEPENDENT_AMBULATORY_CARE_PROVIDER_SITE_OTHER): Payer: Medicare Other | Admitting: Physician Assistant

## 2020-11-14 ENCOUNTER — Encounter: Payer: Self-pay | Admitting: Physician Assistant

## 2020-11-14 VITALS — BP 118/62 | HR 58 | Ht 70.0 in | Wt 170.6 lb

## 2020-11-14 DIAGNOSIS — I10 Essential (primary) hypertension: Secondary | ICD-10-CM

## 2020-11-14 DIAGNOSIS — I25119 Atherosclerotic heart disease of native coronary artery with unspecified angina pectoris: Secondary | ICD-10-CM | POA: Diagnosis not present

## 2020-11-14 DIAGNOSIS — E782 Mixed hyperlipidemia: Secondary | ICD-10-CM | POA: Diagnosis not present

## 2020-11-14 NOTE — Addendum Note (Signed)
Addended byKathlen Mody, Nicki Reaper T on: 11/14/2020 03:48 PM   Modules accepted: Orders

## 2020-11-14 NOTE — Progress Notes (Signed)
Cardiology Office Note:    Date:  11/14/2020   ID:  Adam Burton, DOB 02-08-1951, MRN [id number]  PCP:  [name], MD  [facility] Cardiologist:  Freada Bergeron, MD     Referring MD: [name], MD   Chief Complaint:  F/u on CAD, chest pain    Patient Profile:   Adam Burton is a 70 y.o. male with:  Coronary artery disease  S/p DES to prox to mid LAD in 07/2018 Echocardiogram 12/21: EF 61, mild AI Myoview 2/21: low risk Hypertension  Hyperlipidemia  Diabetes mellitus  Mild AI Echocardiogram 12/21    Prior CV studies: Echocardiogram 02/24/2020 ([facility]):  EF 61, Gr 1 DD, mild AI   Lexiscan (Walking with mod Bruce)Tetrofosmin Stress Test  04/05/2019 ([facility]): Diaph atten, no ischemia or scar, EF 39; Low risk    CORONARY STENT INTERVENTION, CORONARY STENT INTERVENTION 08/04/2018 Narrative LM: Normal LAD: Prox-mid 75% stenosis. CTA FFR 0.75. Diag 1 small vessel with 70% disease LCx: Normal RCA: Prox 60% stenosis. CTA FFR 0.85 Successful percutaneous coronary intervention prox-mid LAD PTCA and stent placement Resolute Onyx 3.5 X 22 mm drug-eluting stent Recommend medical management for small vessel diag disease, and RCA nonobstructive disease.   CT CORONARY MORPH W/CTA COR W/SCORE W/CA W/CM &/OR WO/CM 01/28/2018 Addendum 01/29/2018 11:43 AM IMPRESSION: 1. CT FFR analysis showed severe stenosis in the proximal LAD. A cardiac catheterization is recommended. IMPRESSION: 1. Coronary calcium score of 221. This was 7 percentile for age and sex matched control. 2. Normal coronary origin with right dominance. 3. Severe plaque in the proximal LAD with stenosis > 70%, moderate CAD in the proximal to mid RCA. A cardiac catheterization is recommended.   Stress Echocardiogram 1/81/8 Normal; normal EF at rest   Echocardiogram 12/02/13 GLS -19.4%, EF 55-60, no RWMA, mild LAE   History of Present Illness: Adam Burton  was last seen in 5/22.  He had some chest pain and I recommended proceeding with a Myoview to rule out ischemia.  However, he did not show for the test.  He is seen back for f/u.   He is here alone.  He cancelled his stress test before b/c his chest symptoms improved.  However, he has started to have recurrent R sided chest pain over the last couple of months.  He notes the pain in the AM when he wakes up.  He has not really had exertional symptoms.  He has some chest symptoms with swallowing.  He notes having an EGD at some point in the past.  He has not had L sided chest pain.  He has not had shortness of breath, syncope, orthopnea, leg edema.  He has not had pleuritic chest pain or chest pain with lying flat.  He feels his symptoms are s/w similar to what he had prior to his PCI but not no where near as bad.          Past Medical History:  Diagnosis Date   Diabetes mellitus without complication ([place])    Hypercholesteremia    Hypertension    Renal insufficiency 08/21/2012   Current Medications: Current Meds  Medication Sig   acetaminophen (TYLENOL) 500 MG tablet Take 2 tablets (1,000 mg total) by mouth every 8 (eight) hours as needed for moderate pain.   amLODipine (NORVASC) 10 MG tablet Take 1 tablet (10 mg total) by mouth daily.   aspirin EC 81 MG tablet Take 1 tablet (81 mg total) by mouth daily.  esomeprazole (NEXIUM) 40 MG capsule Take 1 capsule (40 mg total) by mouth daily at 12 noon.   metFORMIN (GLUCOPHAGE-XR) 500 MG 24 hr tablet TAKE 2 TABLETS BY MOUTH ONCE DAILY WITH BREAKFAST   metoprolol succinate (TOPROL-XL) 50 MG 24 hr tablet Take 1 tablet (50 mg total) by mouth daily. Take with or immediately following a meal.   nitroGLYCERIN (NITROSTAT) 0.4 MG SL tablet Place 1 tablet (0.4 mg total) under the tongue every 5 (five) minutes as needed for chest pain.   rosuvastatin (CRESTOR) 20 MG tablet Take 1 tablet (20 mg total) by mouth daily.   tamsulosin (FLOMAX) 0.4 MG CAPS capsule Take 0.4  mg by mouth daily.    Allergies:   Patient has no known allergies.   Social History   Tobacco Use   Smoking status: Never   Smokeless tobacco: Never  Vaping Use   Vaping Use: Never used  Substance Use Topics   Alcohol use: Yes    Comment: beer or vodka occasionally   Drug use: Yes    Types: Marijuana    Comment: 3-6 X per week    Family Hx: The patient's family history includes Diabetes in his maternal aunt, maternal grandmother, mother, and sister; Heart attack in his maternal grandmother and mother. There is no history of Colon cancer, Colon polyps, Esophageal cancer, Rectal cancer, or Stomach cancer.  Review of Systems  Constitutional: Negative for chills and fever.  Respiratory:  Negative for cough.   Gastrointestinal:  Negative for diarrhea, hematochezia, melena and vomiting.  Genitourinary:  Negative for hematuria.    EKGs/Labs/Other Test Reviewed:    EKG:  EKG is not ordered today.  The ekg ordered today demonstrates n/a  Recent Labs: 06/27/2020: BUN 12; Creatinine, Ser 1.42; Potassium 4.0; Sodium 133   Recent Lipid Panel Lab Results  Component Value Date/Time   CHOL 127 06/15/2019 12:28 PM   TRIG 71 06/15/2019 12:28 PM   HDL 60 06/15/2019 12:28 PM   LDLCALC 53 06/15/2019 12:28 PM   LDLDIRECT 84 09/03/2011 09:30 AM     Risk Assessment/Calculations:          Physical Exam:    VS:  BP 118/62   Pulse (!) 58   Ht 5\' 10"  (1.778 m)   Wt 170 lb 9.6 oz (77.4 kg)   SpO2 98%   BMI 24.48 kg/m     Wt Readings from Last 3 Encounters:  11/14/20 170 lb 9.6 oz (77.4 kg)  06/30/20 183 lb 9.6 oz (83.3 kg)  05/22/20 184 lb (83.5 kg)    Constitutional:      Appearance: Healthy appearance. Not in distress.  Neck:     Vascular: JVD normal.  Pulmonary:     Effort: Pulmonary effort is normal.     Breath sounds: No wheezing. No rales.  Cardiovascular:     Normal rate. Regular rhythm. Normal S1. Normal S2.      Murmurs: There is no murmur.  Edema:    Peripheral  edema absent.  Abdominal:     Palpations: Abdomen is soft. There is no hepatomegaly.  Skin:    General: Skin is warm and dry.  Neurological:     General: No focal deficit present.     Mental Status: Alert and oriented to person, place and time.     Cranial Nerves: Cranial nerves are intact.       ASSESSMENT & PLAN:   1. Coronary artery disease involving native coronary artery of native heart with angina pectoris (  [place]) History of DES to the proximal-mid LAD in June 2020.  He did have residual disease with moderate stenosis in a small diagonal and moderate stenosis in the proximal RCA.  This has been managed medically.  He had a low risk Myoview in 2/21.  I saw him in May and he was having chest discomfort that overall sounded noncardiac but did remind him of his previous angina.  I recommended stress testing at that time but he canceled the test.  His symptoms improved with medical therapy for his blood pressure.  However, he has had recurrent symptoms over the past couple of months.  He does have some GI overtones.  Question if this may be gastrointestinal.  He does note a prior history of EGD.  I have recommended proceeding with an exercise Myoview as previously recommended.  If this is low risk, I would recommend he follow-up with primary care or GI for evaluation of other causes.   -Continue ASA 81 mg daily, amlodipine 10 mg daily, metoprolol succinate 50 mg daily, rosuvastatin 20 mg daily -Exercise Myoview -Follow-up 6 months  2. Essential hypertension Blood pressure is well controlled.  Continue amlodipine 10 mg daily, metoprolol succinate 50 mg daily.  3. Mixed hyperlipidemia LDL optimal on most recent lab work.  Continue rosuvastatin 20 mg daily.     Shared Decision Making/Informed Consent The risks [chest pain, shortness of breath, cardiac arrhythmias, dizziness, blood pressure fluctuations, myocardial infarction, stroke/transient ischemic attack, nausea, vomiting, allergic  reaction, radiation exposure, metallic taste sensation and life-threatening complications (estimated to be 1 in 10,000)], benefits (risk stratification, diagnosing coronary artery disease, treatment guidance) and alternatives of a nuclear stress test were discussed in detail with Adam Burton and he agrees to proceed.   Dispo:  Return in about 6 months (around 05/14/2021) for Routine Follow Up, w/ Dr. Johney Frame.   Medication Adjustments/Labs and Tests Ordered: Current medicines are reviewed at length with the patient today.  Concerns regarding medicines are outlined above.  Tests Ordered: Orders Placed This Encounter  Procedures   MYOCARDIAL PERFUSION IMAGING   Medication Changes: No orders of the defined types were placed in this encounter.  Signed, Richardson Dopp, PA-C  11/14/2020 2:29 PM    [facility] [place], [place], [place]  [address] Phone: [phone]; Fax: [phone]

## 2020-11-14 NOTE — Patient Instructions (Addendum)
Medication Instructions:  Your physician recommends that you continue on your current medications as directed. Please refer to the Current Medication list given to you today.  *If you need a refill on your cardiac medications before your next appointment, please call your pharmacy*   Lab Work: None ordered  If you have labs (blood work) drawn today and your tests are completely normal, you will receive your results only by: [place] [place] (if you have MyChart) OR A paper copy in the mail If you have any lab test that is abnormal or we need to change your treatment, we will call you to review the results.   Testing/Procedures: Your physician has requested that you have en exercise stress myoview. For further information please visit [email or web address]. Please follow instruction sheet, BELOW:   Please arrive 15 minutes prior to your appointment time for registration and insurance purposes.    The test will take approximately 3 to 4 hours to complete; you may bring reading material. If someone comes with you to your appointment, they will need to remain in the main lobby due to limited space in the testing area.    How to prepare for your Myocardial Perfusion test:               Do not eat or drink 3 hours prior to your test, except you may have water.                Do not consume products containing caffeine (regular or decaffeinated) 12 hours prior to your test (ex: coffee, chocolate, soda, tea)               Do bring a list of your current medications with you. If not listed below, you may take your medications as normal.    Bring any held medication to your appointment, as you may be required to take it once the test is complete.    Do wear comfortable clothes (no dresses or overalls) and walking shoes. Tennis shoes are preferred. No heels or open toed shoes.   Do not wear cologne, perfume, aftershave or lotions (deodorant is allowed).    If these instructions are not  followed, you test will have to be rescheduled.    Please report to [address] for your test. If you have questions or concerns about your appointment, please call the Nuclear Lab at [phone].   If you cannot keep your appointment, please provide 24 hour notification to the Nuclear lab to avoid a possible $50 charge to your account.      Follow-Up: At [facility], you and your health needs are our priority.  As part of our continuing mission to provide you with exceptional heart care, we have created designated Provider Care Teams.  These Care Teams include your primary Cardiologist (physician) and Advanced Practice Providers (APPs -  Physician Assistants and Nurse Practitioners) who all work together to provide you with the care you need, when you need it.  We recommend signing up for the patient portal called "MyChart".  Sign up information is provided on this After Visit Summary.  MyChart is used to connect with patients for Virtual Visits (Telemedicine).  Patients are able to view lab/test results, encounter notes, upcoming appointments, etc.  Non-urgent messages can be sent to your provider as well.   To learn more about what you can do with MyChart, go to [email or web address].    Your next appointment:   6 month(s)  The format  for your next appointment:   In Person  Provider:   You may see Freada Bergeron, MD or one of the following Advanced Practice Providers on your designated Care Team:   Richardson Dopp, PA-C Robbie Lis, [place]   Other Instructions

## 2020-11-16 ENCOUNTER — Telehealth (HOSPITAL_COMMUNITY): Payer: Self-pay | Admitting: *Deleted

## 2020-11-16 NOTE — Telephone Encounter (Signed)
Attempted to call patient regarding upcoming appointment- no answer, unable to leave a message.  Adam Burton Jacqueline  

## 2020-11-20 ENCOUNTER — Telehealth (HOSPITAL_COMMUNITY): Payer: Self-pay | Admitting: *Deleted

## 2020-11-20 NOTE — Telephone Encounter (Signed)
Attempted to call patient regarding upcoming appointment- no answer, unable to leave a message.  Adam Burton  

## 2020-11-22 ENCOUNTER — Encounter: Payer: Self-pay | Admitting: Physician Assistant

## 2020-11-22 ENCOUNTER — Other Ambulatory Visit: Payer: Self-pay

## 2020-11-22 ENCOUNTER — Ambulatory Visit (HOSPITAL_COMMUNITY): Payer: Medicare Other | Attending: Cardiovascular Disease

## 2020-11-22 DIAGNOSIS — I25119 Atherosclerotic heart disease of native coronary artery with unspecified angina pectoris: Secondary | ICD-10-CM | POA: Insufficient documentation

## 2020-11-22 LAB — MYOCARDIAL PERFUSION IMAGING
Angina Index: 0
Base ST Depression (mm): 0 mm
Duke Treadmill Score: 7
Estimated workload: 7.7
Exercise duration (min): 7 min
LV dias vol: 41 mL (ref 62–150)
LV sys vol: 99 mL
MPHR: 149 {beats}/min
Nuc Stress EF: 59 %
Peak HR: 130 {beats}/min
Percent HR: 87 %
Rest HR: 69 {beats}/min
Rest Nuclear Isotope Dose: 10.8 mCi
SDS: 0
SRS: 0
SSS: 0
ST Depression (mm): 0 mm
Stress Nuclear Isotope Dose: 30.7 mCi
TID: 0.95

## 2020-11-22 MED ORDER — TECHNETIUM TC 99M TETROFOSMIN IV KIT
30.7000 | PACK | Freq: Once | INTRAVENOUS | Status: AC | PRN
Start: 2020-11-22 — End: 2020-11-22
  Administered 2020-11-22: 30.7 via INTRAVENOUS
  Filled 2020-11-22: qty 31

## 2020-11-22 MED ORDER — TECHNETIUM TC 99M TETROFOSMIN IV KIT
10.8000 | PACK | Freq: Once | INTRAVENOUS | Status: AC | PRN
  Administered 2020-11-22: 10.8 via INTRAVENOUS
  Filled 2020-11-22: qty 11

## 2020-12-05 ENCOUNTER — Other Ambulatory Visit: Payer: Self-pay

## 2020-12-05 ENCOUNTER — Ambulatory Visit (INDEPENDENT_AMBULATORY_CARE_PROVIDER_SITE_OTHER): Payer: Medicare Other | Admitting: Family Medicine

## 2020-12-05 ENCOUNTER — Encounter: Payer: Self-pay | Admitting: Family Medicine

## 2020-12-05 ENCOUNTER — Ambulatory Visit: Payer: Medicare Other | Admitting: Physician Assistant

## 2020-12-05 VITALS — BP 120/81 | HR 66 | Wt 174.2 lb

## 2020-12-05 DIAGNOSIS — I10 Essential (primary) hypertension: Secondary | ICD-10-CM

## 2020-12-05 DIAGNOSIS — E119 Type 2 diabetes mellitus without complications: Secondary | ICD-10-CM | POA: Diagnosis not present

## 2020-12-05 DIAGNOSIS — K219 Gastro-esophageal reflux disease without esophagitis: Secondary | ICD-10-CM

## 2020-12-05 LAB — POCT GLYCOSYLATED HEMOGLOBIN (HGB A1C): HbA1c, POC (prediabetic range): 6.2 % (ref 5.7–6.4)

## 2020-12-05 MED ORDER — FAMOTIDINE 20 MG PO TABS: 20.0000 mg | ORAL_TABLET | Freq: Two times a day (BID) | ORAL | 3 refills | Status: DC

## 2020-12-05 NOTE — Patient Instructions (Signed)
Schedule your Annual Wellness Visit on the way out  Sending in pepcid for you If this doesn't work please let me know and we can try sucralfate  Referring to eye doctor for eye exam  Be well, Dr. Ardelia Mems

## 2020-12-05 NOTE — Progress Notes (Signed)
  Date of Visit: 12/05/2020   SUBJECTIVE:   HPI:  Adam Burton presents today for routine follow up. Unfortunately he arrived 30+ minutes late for his appointment so this appointment was delayed to the end of clinic and had to be abbreviated.  Hypertension - currently taking metoprolol succinate 50mg  daily and amlodipine 10mg  daily. Tolerating these well.   Diabetes - taking metformin XR 1000mg  daily (takes 2 500mg  tabs). A1c today 6.2  GERD - he notes pressure in his chest when he is done eating. Recently saw cardiology for this and underwent stress testing which was a low risk study. Cardiology recommended GI evaluation as symptoms seemed upper GI in nature. He currently takes nexium 40mg  daily, but no other reflux medications. He would prefer to avoid seeing GI if possible.  He had a home visit from a nurse with his insurance and brought the form with their recommendations - no major updates noted on form.  OBJECTIVE:   BP 120/81   Pulse 66   Wt 174 lb 3.2 oz (79 kg)   SpO2 100%   BMI 25.00 kg/m  Gen: no acute distress, pleasant cooperative HEENT: normocephalic, atraumatic  Heart: regular rate and rhythm, no murmur Lungs: clear to auscultation bilaterally, normal work of breathing Neuro: alert, speech normal, grossly nonfocal Ext: No appreciable lower extremity edema bilaterally  Abdomen: soft, nontender to palpation   ASSESSMENT/PLAN:   Health maintenance:  -declined flu shot today -discussed need for eye exam, will enter referral -advised to schedule Annual Wellness Visit   Gastroesophageal reflux disease Uncontrolled, chest pain consistent with GERD. Recent cardiac workup reassuring Will add pepcid twice daily to his regimen He will let me know if this doesn't work and we'll add sucralfate At that point if still having symptoms would refer to GI Patient agreeable to this plan  Type 2 diabetes mellitus ([place]) Well controlled with A1c of 6.2. continue metformin. Refer  for eye exam.  Essential hypertension, benign Well controlled. Continue current medication regimen.   Adam Burton. Ardelia Mems, [place]

## 2020-12-05 NOTE — Progress Notes (Signed)
6 

## 2020-12-06 ENCOUNTER — Ambulatory Visit (INDEPENDENT_AMBULATORY_CARE_PROVIDER_SITE_OTHER): Payer: Medicare Other

## 2020-12-06 DIAGNOSIS — Z0000 Encounter for general adult medical examination without abnormal findings: Secondary | ICD-10-CM | POA: Diagnosis not present

## 2020-12-06 NOTE — Patient Instructions (Addendum)
You spoke to Adam Burton, [place] over the phone for your annual wellness visit.  We discussed goals:   Goals       Exercise 150 min/wk Moderate Activity      HEMOGLOBIN A1C < 6.5 (pt-stated)      6.2 on 12/05/20      Increase water intake       We also discussed recommended health maintenance. As discussed, you are due for: Health Maintenance  Topic Date Due   TETANUS/TDAP  03/29/2015   COVID-19 Vaccine (3 - [place] risk series) 03/23/2020   FOOT EXAM  06/14/2020   URINE MICROALBUMIN  09/20/2020   OPHTHALMOLOGY EXAM  09/22/2020   Zoster Vaccines- Shingrix (2 of 2) 12/03/2020   INFLUENZA VACCINE  05/25/2021 (Originally 09/25/2020)   HEMOGLOBIN A1C  06/05/2021   Hepatitis C Screening  Completed   HPV VACCINES  Aged Out   You are due for #2 Shingrix dose. Please let us know if you do not hear anything from opthalmology referral. Try and get 1-2 more days of walking in! Fill out an advance directive.  FU with PCP as needed.   Preventive Care 83 Years and Older, Male Preventive care refers to lifestyle choices and visits with your health care provider that can promote health and wellness. This includes: A yearly physical exam. This is also called an annual wellness visit. Regular dental and eye exams. Immunizations. Screening for certain conditions. Healthy lifestyle choices, such as: Eating a healthy diet. Getting regular exercise. Not using drugs or products that contain nicotine and tobacco. Limiting alcohol use. What can I expect for my preventive care visit? Physical exam Your health care provider will check your: Height and weight. These may be used to calculate your BMI (body mass index). BMI is a measurement that tells if you are at a healthy weight. Heart rate and blood pressure. Body temperature. Skin for abnormal spots. Counseling Your health care provider may ask you questions about your: Past medical problems. Family's medical history. Alcohol, tobacco,  and drug use. Emotional well-being. Home life and relationship well-being. Sexual activity. Diet, exercise, and sleep habits. History of falls. Memory and ability to understand (cognition). Work and work Statistician. Access to firearms. What immunizations do I need? Vaccines are usually given at various ages, according to a schedule. Your health care provider will recommend vaccines for you based on your age, medical history, and lifestyle or other factors, such as travel or where you work. What tests do I need? Blood tests Lipid and cholesterol levels. These may be checked every 5 years, or more often depending on your overall health. Hepatitis C test. Hepatitis B test. Screening Lung cancer screening. You may have this screening every year starting at age 72 if you have a 30-pack-year history of smoking and currently smoke or have quit within the past 15 years. Colorectal cancer screening. All adults should have this screening starting at age 78 and continuing until age 39. Your health care provider may recommend screening at age 45 if you are at increased risk. You will have tests every 1-10 years, depending on your results and the type of screening test. Prostate cancer screening. Recommendations will vary depending on your family history and other risks. Genital exam to check for testicular cancer or hernias. Diabetes screening. This is done by checking your blood sugar (glucose) after you have not eaten for a while (fasting). You may have this done every 1-3 years. Abdominal aortic aneurysm (AAA) screening. You may need  this if you are a current or former smoker. STD (sexually transmitted disease) testing, if you are at risk. Follow these instructions at home: Eating and drinking  Eat a diet that includes fresh fruits and vegetables, whole grains, lean protein, and low-fat dairy products. Limit your intake of foods with high amounts of sugar, saturated fats, and salt. Take  vitamin and mineral supplements as recommended by your health care provider. Do not drink alcohol if your health care provider tells you not to drink. If you drink alcohol: Limit how much you have to 0-2 drinks a day. Be aware of how much alcohol is in your drink. In the [place] one drink equals one 12 oz bottle of beer (355 mL), one 5 oz glass of wine (148 mL), or one 1 oz glass of hard liquor (44 mL). Lifestyle Take daily care of your teeth and gums. Brush your teeth every morning and night with fluoride toothpaste. Floss one time each day. Stay active. Exercise for at least 30 minutes 5 or more days each week. Do not use any products that contain nicotine or tobacco, such as cigarettes, e-cigarettes, and chewing tobacco. If you need help quitting, ask your health care provider. Do not use drugs. If you are sexually active, practice safe sex. Use a condom or other form of protection to prevent STIs (sexually transmitted infections). Talk with your health care provider about taking a low-dose aspirin or statin. Find healthy ways to cope with stress, such as: Meditation, yoga, or listening to music. Journaling. Talking to a trusted person. Spending time with friends and family. Safety Always wear your seat belt while driving or riding in a vehicle. Do not drive: If you have been drinking alcohol. Do not ride with someone who has been drinking. When you are tired or distracted. While texting. Wear a helmet and other protective equipment during sports activities. If you have firearms in your house, make sure you follow all gun safety procedures. What's next? Visit your health care provider once a year for an annual wellness visit. Ask your health care provider how often you should have your eyes and teeth checked. Stay up to date on all vaccines. This information is not intended to replace advice given to you by your health care provider. Make sure you discuss any questions you have with  your health care provider. Document Revised: 04/21/2020 Document Reviewed: 02/05/2018 Elsevier Patient Education  2022 [employer].  Our clinic's number is [phone]. Please call with questions or concerns about what we discussed today.

## 2020-12-06 NOTE — Progress Notes (Signed)
Subjective:   Adam Burton is a 70 y.o. male who presents for Medicare Annual/Subsequent preventive examination.  The patient consented to a virtual visit. Patient consented to have virtual visit and was identified by name and date of birth. Method of visit: Telephone  Encounter participants: Patient: Adam Burton - located at Home Nurse/Provider: Dorna Bloom - located at [facility] Others (if applicable): NA  Review of Systems: Defer to PCP  Cardiac Risk Factors include: advanced age (>79men, >81 women);diabetes mellitus;male gender;hypertension  Objective:    Vitals: There were no vitals taken for this visit.  There is no height or weight on file to calculate BMI.  Advanced Directives 12/06/2020 12/14/2019 08/05/2019 04/01/2019 04/24/2018 10/02/2017 03/18/2017  Does Patient Have a Medical Advance Directive? No No No No No No No  Would patient like information on creating a medical advance directive? Yes (MAU/Ambulatory/Procedural Areas - Information given) No - Patient declined No - Patient declined No - Patient declined No - Patient declined No - Patient declined No - Patient declined   Tobacco Social History   Tobacco Use  Smoking Status Never  Smokeless Tobacco Never     Clinical Intake:  Pre-visit preparation completed: Yes  Pain Score: 0-No pain  How often do you need to have someone help you when you read instructions, pamphlets, or other written materials from your doctor or pharmacy?: 2 - Rarely What is the last grade level you completed in school?: High School  Past Medical History:  Diagnosis Date   CAD (coronary artery disease)    S/p DES to prox to mid LAD in 07/2018 // Myoview 9/22: No ischemia or infarction, EF 62, low risk   Diabetes mellitus without complication ([place])    Hypercholesteremia    Hypertension    Renal insufficiency 08/21/2012   Past Surgical History:  Procedure Laterality Date   CARDIAC CATHETERIZATION  01/2009   Dr. [place]. Normal  coronary arteries. Normal LV function.   CORONARY ANGIOGRAPHY N/A 08/04/2018   Procedure: CORONARY ANGIOGRAPHY (CATH LAB);  Surgeon: Nigel Mormon, MD;  Location: [facility];  Service: Cardiovascular;  Laterality: N/A;   CORONARY STENT INTERVENTION N/A 08/04/2018   Procedure: CORONARY STENT INTERVENTION;  Surgeon: Nigel Mormon, MD;  Location: [facility];  Service: Cardiovascular;  Laterality: N/A;   HERNIA REPAIR     NM MYOVIEW LTD  12/2008   Potential small area of mild ischemia in the basal inferoseptal wall. Otherwise normal. --> False positive test based on coronary angiography   Stress Echocardiogram  12/2013   Exercise for just over 4 minutes. Heart rate increased to 148 BPM. Hypertensive response to exercise. EF 60%. No evidence of ischemia. NORMAL STUDY   TRANSTHORACIC ECHOCARDIOGRAM  11/2013   EF 55-60%. No regional wall motion abnormality is. Normal thickness. Mild LA dilation.   Family History  Problem Relation Age of Onset   Heart attack Mother    Diabetes Mother    Diabetes Sister    Diabetes Maternal Grandmother    Heart attack Maternal Grandmother    Diabetes Maternal Aunt    Colon cancer Neg Hx    Colon polyps Neg Hx    Esophageal cancer Neg Hx    Rectal cancer Neg Hx    Stomach cancer Neg Hx    Social History   Socioeconomic History   Marital status: Widowed    Spouse name: Not on file   Number of children: 3   Years of education: 49  Highest education level: 12th grade  Occupational History   Occupation: Retired Biomedical scientist  Tobacco Use   Smoking status: Never   Smokeless tobacco: Never  Vaping Use   Vaping Use: Never used  Substance and Sexual Activity   Alcohol use: Yes    Alcohol/week: 4.0 standard drinks    Types: 4 Standard drinks or equivalent per week    Comment: beer or vodka occasionally   Drug use: Yes    Types: Marijuana    Comment: 3-6 X per week   Sexual activity: Yes  Other Topics Concern   Not on file  Social  History Narrative   Patient lives alone in [place].    Patient has 3 children, grandchildren, and 1 great grandchild.    Patient is a retired Biomedical scientist. Patient enjoys cooking and enjoying his food.    Patient walks for exercise when the weather is nice, admits he could do more.    Social Determinants of Health   Financial Resource Strain: Low Risk    Difficulty of Paying Living Expenses: Not hard at all  Food Insecurity: No Food Insecurity   Worried About Charity fundraiser in the Last Year: Never true   [place] in the Last Year: Never true  Transportation Needs: No Transportation Needs   Lack of Transportation (Medical): No   Lack of Transportation (Non-Medical): No  Physical Activity: Insufficiently Active   Days of Exercise per Week: 3 days   Minutes of Exercise per Session: 30 min  Stress: No Stress Concern Present   Feeling of Stress : Only a little  Social Connections: Moderately Isolated   Frequency of Communication with Friends and Family: More than three times a week   Frequency of Social Gatherings with Friends and Family: More than three times a week   Attends Religious Services: More than 4 times per year   Active Member of [employer] or Organizations: No   Attends Archivist Meetings: Never   Marital Status: Widowed   Outpatient Encounter Medications as of 12/06/2020  Medication Sig   acetaminophen (TYLENOL) 500 MG tablet Take 2 tablets (1,000 mg total) by mouth every 8 (eight) hours as needed for moderate pain.   amLODipine (NORVASC) 10 MG tablet Take 1 tablet (10 mg total) by mouth daily.   aspirin EC 81 MG tablet Take 1 tablet (81 mg total) by mouth daily.   esomeprazole (NEXIUM) 40 MG capsule Take 1 capsule (40 mg total) by mouth daily at 12 noon.   famotidine (PEPCID) 20 MG tablet Take 1 tablet (20 mg total) by mouth 2 (two) times daily.   metFORMIN (GLUCOPHAGE-XR) 500 MG 24 hr tablet TAKE 2 TABLETS BY MOUTH ONCE DAILY WITH BREAKFAST   metoprolol  succinate (TOPROL-XL) 50 MG 24 hr tablet Take 1 tablet (50 mg total) by mouth daily. Take with or immediately following a meal.   nitroGLYCERIN (NITROSTAT) 0.4 MG SL tablet Place 1 tablet (0.4 mg total) under the tongue every 5 (five) minutes as needed for chest pain.   rosuvastatin (CRESTOR) 20 MG tablet Take 1 tablet (20 mg total) by mouth daily.   tamsulosin (FLOMAX) 0.4 MG CAPS capsule Take 0.4 mg by mouth daily.   No facility-administered encounter medications on file as of 12/06/2020.   Activities of Daily Living In your present state of health, do you have any difficulty performing the following activities: 12/06/2020  Hearing? N  Vision? N  Difficulty concentrating or making decisions? N  Walking or climbing  stairs? N  Dressing or bathing? N  Doing errands, shopping? N  Preparing Food and eating ? N  Using the Toilet? N  In the past six months, have you accidently leaked urine? N  Do you have problems with loss of bowel control? N  Managing your Medications? N  Managing your Finances? N  Housekeeping or managing your Housekeeping? N  Some recent data might be hidden   Patient Care Team: [name], MD as PCP - General (Family Medicine) Freada Bergeron, MD as PCP - Cardiology (Cardiology)   Assessment:   This is a routine wellness examination for Adam Burton.  Exercise Activities and Dietary recommendations Current Exercise Habits: Home exercise routine, Time (Minutes): 30, Frequency (Times/Week): 3, Weekly Exercise (Minutes/Week): 90, Intensity: Mild, Exercise limited by: None identified   Goals       Exercise 150 min/wk Moderate Activity      HEMOGLOBIN A1C < 6.5 (pt-stated)      6.2 on 12/05/20      Increase water intake       Fall Risk Fall Risk  12/06/2020 12/05/2020 12/14/2019 11/12/2018 10/07/2018  Falls in the past year? 0 0 0 0 0  Number falls in past yr: 0 0 0 - -  Injury with Fall? 0 0 0 - -  Risk for fall due to : No Fall Risks - - - -   Follow up Falls prevention discussed - - - -   Patient reports normal gait and balance. Patient does not use assistive devices to ambulate.   Is the patient's home free of loose throw rugs in walkways, pet beds, electrical cords, etc?   yes      Grab bars in the bathroom? yes      Handrails on the stairs?   yes      Adequate lighting?   yes  Patient rating of health (0-10): 8   Depression Screen PHQ 2/9 Scores 12/06/2020 12/05/2020 12/14/2019 08/05/2019  PHQ - 2 Score 0 0 0 0  PHQ- 9 Score 0 0 0 -  Exception Documentation - - - -   Cognitive Function 6CIT Screen 12/06/2020  What Year? 0 points  What month? 0 points  What time? 0 points  Count back from 20 0 points  Months in reverse 0 points  Repeat phrase 0 points  Total Score 0   Immunization History  Administered Date(s) Administered   Influenza Whole 01/05/2009   Janssen (J&J) SARS-COV-2 Vaccination 05/29/2019, 01/27/2020   Pneumococcal Conjugate-13 06/12/2016   Pneumococcal Polysaccharide-23 04/24/2018   Zoster Recombinat (Shingrix) 10/08/2020   Screening Tests Health Maintenance  Topic Date Due   TETANUS/TDAP  03/29/2015   COVID-19 Vaccine (3 - Janssen risk series) 03/23/2020   FOOT EXAM  06/14/2020   URINE MICROALBUMIN  09/20/2020   OPHTHALMOLOGY EXAM  09/22/2020   Zoster Vaccines- Shingrix (2 of 2) 12/03/2020   INFLUENZA VACCINE  05/25/2021 (Originally 09/25/2020)   HEMOGLOBIN A1C  06/05/2021   Hepatitis C Screening  Completed   HPV VACCINES  Aged Out   Cancer Screenings: Lung: Low Dose CT Chest recommended if Age 70-80 years, 30 pack-year currently smoking OR have quit w/in 15years. Patient does not qualify. Colorectal: 01/26/2020  Additional Screenings: Hepatitis C Screening: Completed  HIV Screening: Completed   Plan:  You are due for #2 Shingrix dose. Please let us know if you do not hear anything from opthalmology referral. Try and get 1-2 more days of walking in! Fill out an advance directive.  FU with PCP as needed.   I have personally reviewed and noted the following in the patient's chart:   Medical and social history Use of alcohol, tobacco or illicit drugs  Current medications and supplements Functional ability and status Nutritional status Physical activity Advanced directives List of other physicians Hospitalizations, surgeries, and ER visits in previous 12 months Vitals Screenings to include cognitive, depression, and falls Referrals and appointments  In addition, I have reviewed and discussed with patient certain preventive protocols, quality metrics, and best practice recommendations. A written personalized care plan for preventive services as well as general preventive health recommendations were provided to patient.  This visit was conducted virtually in the setting of the [place] pandemic.   Dorna Bloom, [place]  12/06/2020

## 2020-12-11 NOTE — Assessment & Plan Note (Signed)
Well controlled with A1c of 6.2. continue metformin. Refer for eye exam.

## 2020-12-11 NOTE — Assessment & Plan Note (Signed)
Uncontrolled, chest pain consistent with GERD. Recent cardiac workup reassuring Will add pepcid twice daily to his regimen He will let me know if this doesn't work and we'll add sucralfate At that point if still having symptoms would refer to GI Patient agreeable to this plan

## 2020-12-11 NOTE — Progress Notes (Signed)
I have reviewed this visit and agree with the documentation.  [name], MD

## 2020-12-11 NOTE — Assessment & Plan Note (Signed)
Well controlled. Continue current medication regimen.  

## 2021-03-22 ENCOUNTER — Telehealth: Payer: Self-pay | Admitting: Cardiology

## 2021-03-22 NOTE — Telephone Encounter (Signed)
Pt aware of recommendations per Dr. Johney Frame. He will see his PCP tomorrow as planned and will piggy back with him on his GI meds and possible new referral to a GI MD.  Did advise him to start monitoring his blood pressures. Pt verbalized understanding and agrees with this plan. Pt was more than gracious for all the assistance provided.

## 2021-03-22 NOTE — Telephone Encounter (Signed)
Completely agree with your plan and assessment. Looks like this right sided pain has been occurring since his last visit with Nicki Reaper which is why he got the stress test. Agree that it does not sound cardiac in nature and I am glad he has PCP follow-up next week. Let us know how his blood pressures are doing but this may be a GERD issue like he has had in the past.

## 2021-03-22 NOTE — Telephone Encounter (Signed)
Pt is calling in with complaints of chest pressure that comes and goes over the last week or so.  Pt states it is not chest pain, more like chest pressure, and is worsened when he lays on his right side.  He states the pressure mostly occurs in the morning time and is intermittent in nature.  Pt states he has mild DOE.  He denies sob at rest. Pt denies having chest pain or pressure with exertion. He denies having any  N/V, diaphoresis, dizziness, pre-syncopal or syncopal episodes.  He states it's only intermittent pressure which occurs mostly in the morning time when he lays on his right side.  He reports he is not taking his BP/HR at home, but will start doing this again.  He denies any lower extremity edema. He states he is taking all his meds as prescribed.  He states he has not had to use Nitro at all, during the duration of his intermittent chest pressure.  He reports to me that he will be leaving on a train to visit his daughter in [place] and will be gone for the next week.   He states he leaves tomorrow afternoon.  He adds that his PCP was able to make him an appt to be seen tomorrow morning, for these complaints, so he can be reassured to safely proceed with his travel plans.  Pt will see Dr. Johney Frame on 05/16/21.   Pt was seen by Richardson Dopp PA-C on 11/14/20 and stress test was ordered at that time.  Stress test done on 9/30 where Scott said his myoview was normal and advised him to  follow-up with his PCP and GI MD to evaluate other causes of his non-cardiac chest pain.  He does have history of bad reflux/GERD and GI issues.  Informed the pt that being he will be away for travel tomorrow, it is hard to accurately assess him without being seen.  Advised him to follow-up with his PCP in the morning for complaints.  Advised him to start monitoring his pressures at home, maintain low sodium diet, hydrate well, continue with his regimen, wear compression while traveling, and refer to the ER  wherever he's at, if symptoms worsen or persist.  Advised him to make sure he takes his meds with him on his trip.  Advised to take his nitro with him as well, and take as directed.   Informed the pt that I will route this message to Dr. Johney Frame to further review and advise if needed, and I will make her aware of this plan.  Pt verbalized understanding and agrees with this plan.

## 2021-03-22 NOTE — Telephone Encounter (Signed)
° °  Pt c/o of Chest Pain: STAT if CP now or developed within 24 hours  1. Are you having CP right now? No   2. Are you experiencing any other symptoms (ex. SOB, nausea, vomiting, sweating)? SOB  3. How long have you been experiencing CP? Last night  4. Is your CP continuous or coming and going? Coming and going   5. Have you taken Nitroglycerin? No  Pt said he woken up last night with a chest discomfort, he said its on his right side. Offered an appt with Dr. Johney Frame on 01/31 since someone cancelled. Pt said he will be going to [place] that day and wondering if he can be seen after he gets back on 02/03. Advised no available appt, he wanted to know what he needs to do and if he needs to go to ED since he is concern about what he felt last night.

## 2021-03-23 ENCOUNTER — Other Ambulatory Visit: Payer: Self-pay

## 2021-03-23 ENCOUNTER — Ambulatory Visit (INDEPENDENT_AMBULATORY_CARE_PROVIDER_SITE_OTHER): Payer: Medicare Other | Admitting: Family Medicine

## 2021-03-23 DIAGNOSIS — M940 Chondrocostal junction syndrome [Tietze]: Secondary | ICD-10-CM | POA: Diagnosis not present

## 2021-03-23 NOTE — Progress Notes (Signed)
° ° °  SUBJECTIVE:   CHIEF COMPLAINT / HPI:   Patient presents with intermittent right-sided chest pain for the last few weeks. Describes the pain as dull, achy pain that stays along the right side of his chests. Denies any radiating pain, changes in pain with activity, numbness or tingling. He usually first gets the pain when he gets up in the morning and turns a certain position. Denies any recent illness. He maintains routine follow up with cardiology who did a stress test which was negative and thus determined that these symptoms are not cardiac related. He does have a history of reflux and is compliant on esomeprazole daily every morning. He eats snacks very close to bedtime. Denies epigastric pain, dysphagia, burning or reflux sensation. He also denies a globulus sensation. He eats a lot of spicy food. Denies any trauma or injury but when he gets pain, he feels a pull when he turns too fast.   OBJECTIVE:   BP 128/79    Pulse (!) 55    Ht 5\' 11"  (1.803 m)    Wt 177 lb (80.3 kg)    SpO2 100%    BMI 24.69 kg/m   General: Patient well-appearing, in no acute distress. CV: RRR, no murmurs or gallops auscultated  Resp: CTAB, no wheezing or rhonchi noted Abdomen: soft, nontender, nondistended, presence of bowel sounds  MSK: chest pain reproducible on palpation of right chest with tenderness along deep palpation of the left chest  Psych: mood appropriate   ASSESSMENT/PLAN:   Costochondritis -likely pain secondary to muscle strain given history and pain reproducible on exam -handout provided, heat and ice application along with tylenol as needed for pain, instructed to avoid activities that cause more pain  -low concern for cardiac etiology, no indication for EKG or troponins today given cardiac evaluation and no change in symptoms -possibly history of GERD contributing, continue esomeprazole daily, instructed to maintain food diary and bring to next visit, GERD precautions discussed  -low concern  for anxiety related etiology -follow up in 2 weeks, strict ED precautions given if chest pain changes in characteristic or intensity     Adam Burton Larae Grooms, [place]

## 2021-03-23 NOTE — Patient Instructions (Signed)
It was great seeing you today!  Today we discussed your chest pain. I think this is due to muscle strain. Please avoid doing anything that makes the pain worse. You may alternate between ice and heat on the affected area.   Please keep a food diary of everything that you are eating and when you notice these symptoms. Bring this to your next visit.  If your chest pain changes characteristically from this in any way and you notice radiating pain down your arm or jaw with activity then please go to the emergency department to make sure that it is not due to your heart. But for now it seems that your pain is not due to anything heart related.  Please make sure to continue to follow up with your cardiologist.   Please follow up at your next scheduled appointment in 2 weeks, if anything arises between now and then, please don't hesitate to contact our office.   Thank you for allowing [place] to be a part of your medical care!  Thank you, Dr. Larae Grooms

## 2021-03-24 DIAGNOSIS — M940 Chondrocostal junction syndrome [Tietze]: Secondary | ICD-10-CM | POA: Insufficient documentation

## 2021-03-24 NOTE — Assessment & Plan Note (Addendum)
-  likely pain secondary to muscle strain given history and pain reproducible on exam -handout provided, heat and ice application along with tylenol as needed for pain, instructed to avoid activities that cause more pain  -low concern for cardiac etiology, no indication for EKG or troponins today given cardiac evaluation and no change in symptoms -possibly history of GERD contributing, continue esomeprazole daily, instructed to maintain food diary and bring to next visit, GERD precautions discussed  -low concern for anxiety related etiology -follow up in 2 weeks, strict ED precautions given if chest pain changes in characteristic or intensity

## 2021-04-17 ENCOUNTER — Other Ambulatory Visit: Payer: Self-pay | Admitting: Cardiology

## 2021-04-17 DIAGNOSIS — I2510 Atherosclerotic heart disease of native coronary artery without angina pectoris: Secondary | ICD-10-CM

## 2021-04-26 ENCOUNTER — Other Ambulatory Visit: Payer: Self-pay

## 2021-04-26 DIAGNOSIS — I2510 Atherosclerotic heart disease of native coronary artery without angina pectoris: Secondary | ICD-10-CM

## 2021-04-26 MED ORDER — ESOMEPRAZOLE MAGNESIUM 40 MG PO CPDR: 40.0000 mg | DELAYED_RELEASE_CAPSULE | Freq: Every day | ORAL | 11 refills | Status: DC

## 2021-04-26 MED ORDER — ROSUVASTATIN CALCIUM 20 MG PO TABS: 20.0000 mg | ORAL_TABLET | Freq: Every day | ORAL | 3 refills | Status: DC

## 2021-04-26 NOTE — Telephone Encounter (Signed)
[facility] mail order pharmacy is requesting a refill on rosuvastatin. Would Dr. Johney Frame like to refill this medication? Please address ?

## 2021-04-30 ENCOUNTER — Other Ambulatory Visit: Payer: Self-pay | Admitting: *Deleted

## 2021-04-30 DIAGNOSIS — I10 Essential (primary) hypertension: Secondary | ICD-10-CM

## 2021-04-30 MED ORDER — METOPROLOL SUCCINATE ER 50 MG PO TB24: 50.0000 mg | ORAL_TABLET | Freq: Every day | ORAL | 3 refills | Status: DC

## 2021-05-13 NOTE — Progress Notes (Deleted)
?Cardiology Office Note:   ? ?Date:  05/13/2021  ? ?ID:  Adam Burton, DOB 21-Dec-1950, MRN [id number] ? ?PCP:  Adam Rio, MD ?  ?[place]  ?Cardiologist:  Adam Bergeron, MD  ?Advanced Practice Provider:  No care team member to display ?Electrophysiologist:  None  ? ?Referring MD: Adam Rio, MD  ? ? ?History of Present Illness:   ? ?Adam Burton is a 71 y.o. male with a hx of CAD s/p prox-mid LAD DES in 07/2017, HTN, HLD, DMII who presents to clinic for follow-up. ?  ?Patient was previously followed by Dr Adam Burton. During his last visit on 03/03/20, he was doing well. TTE with normal LVEF, G1DD, mild AR. Lexiscan in 02/21 without diaphragmatic attenuation but no ischemia. EF was called low but TTE in 2021 with EF 61%. He was continued on ASA, statin and metop.  ?  ?Seen in clinic on 04/24/20 where he was having episodes of nonexertional chest discomfort and elevated blood pressures at home. We recommended him to start losartan, but he had elevated K on labs prior to starting so therefore we recommended amlodipine. ? ?Had extensive screening in 04/2020 where Carotids were negative for plaque. ABI negative for PAD. No abdominal aortic aneurysm. ? ?Was last seen on 10/2020 where he was doing better. Had cancelled his myoview that was ordered for chest pain as symptoms resolved. Otherwise, was doing well from a CV standpoint.  ? ?Today, *** ? ?Past Medical History:  ?Diagnosis Date  ? CAD (coronary artery disease)   ? S/p DES to prox to mid LAD in 07/2018 // Myoview 9/22: No ischemia or infarction, EF 59, low risk  ? Diabetes mellitus without complication ([place])   ? Hypercholesteremia   ? Hypertension   ? Renal insufficiency 08/21/2012  ? ? ?Past Surgical History:  ?Procedure Laterality Date  ? CARDIAC CATHETERIZATION  01/2009  ? Dr. [place]. Normal coronary arteries. Normal LV function.  ? CORONARY ANGIOGRAPHY N/A 08/04/2018  ? Procedure: CORONARY ANGIOGRAPHY (CATH  LAB);  Surgeon: Adam Mormon, MD;  Location: [facility];  Service: Cardiovascular;  Laterality: N/A;  ? CORONARY STENT INTERVENTION N/A 08/04/2018  ? Procedure: CORONARY STENT INTERVENTION;  Surgeon: Adam Mormon, MD;  Location: [facility];  Service: Cardiovascular;  Laterality: N/A;  ? HERNIA REPAIR    ? NM MYOVIEW LTD  12/2008  ? Potential small area of mild ischemia in the basal inferoseptal wall. Otherwise normal. --> False positive test based on coronary angiography  ? Stress Echocardiogram  12/2013  ? Exercise for just over 4 minutes. Heart rate increased to 148 BPM. Hypertensive response to exercise. EF 60%. No evidence of ischemia. NORMAL STUDY  ? TRANSTHORACIC ECHOCARDIOGRAM  11/2013  ? EF 55-60%. No regional wall motion abnormality is. Normal thickness. Mild LA dilation.  ? ? ?Current Medications: ?No outpatient medications have been marked as taking for the 05/16/21 encounter (Appointment) with Adam Bergeron, MD.  ?  ? ?Allergies:   Patient has no known allergies.  ? ?Social History  ? ?Socioeconomic History  ? Marital status: Widowed  ?  Spouse name: Not on file  ? Number of children: 3  ? Years of education: 47  ? Highest education level: 12th grade  ?Occupational History  ? Occupation: Retired Biomedical scientist  ?Tobacco Use  ? Smoking status: Never  ? Smokeless tobacco: Never  ?Vaping Use  ? Vaping Use: Never used  ?Substance and Sexual Activity  ?  Alcohol use: Yes  ?  Alcohol/week: 4.0 standard drinks  ?  Types: 4 Standard drinks or equivalent per week  ?  Comment: beer or vodka occasionally  ? Drug use: Yes  ?  Types: Marijuana  ?  Comment: 3-6 X per week  ? Sexual activity: Yes  ?Other Topics Concern  ? Not on file  ?Social History Narrative  ? Patient lives alone in [place].   ? Patient has 3 children, grandchildren, and 1 great grandchild.   ? Patient is a retired Biomedical scientist. Patient enjoys cooking and enjoying his food.   ? Patient walks for exercise when the weather is nice,  admits he could do more.   ? ?Social Determinants of Health  ? ?Financial Resource Strain: Low Risk   ? Difficulty of Paying Living Expenses: Not hard at all  ?Food Insecurity: No Food Insecurity  ? Worried About Charity fundraiser in the Last Year: Never true  ? Ran Out of Food in the Last Year: Never true  ?Transportation Needs: No Transportation Needs  ? Lack of Transportation (Medical): No  ? Lack of Transportation (Non-Medical): No  ?Physical Activity: Insufficiently Active  ? Days of Exercise per Week: 3 days  ? Minutes of Exercise per Session: 30 min  ?Stress: No Stress Concern Present  ? Feeling of Stress : Only a little  ?Social Connections: Moderately Isolated  ? Frequency of Communication with Friends and Family: More than three times a week  ? Frequency of Social Gatherings with Friends and Family: More than three times a week  ? Attends Religious Services: More than 4 times per year  ? Active Member of Clubs or Organizations: No  ? Attends Archivist Meetings: Never  ? Marital Status: Widowed  ?  ? ?Family History: ?The patient's family history includes Diabetes in his maternal aunt, maternal grandmother, mother, and sister; Heart attack in his maternal grandmother and mother. There is no history of Colon cancer, Colon polyps, Esophageal cancer, Rectal cancer, or Stomach cancer. ? ?ROS:   ?Please see the history of present illness.    ?Review of Systems  ?Constitutional:  Negative for chills and fever.  ?HENT:  Negative for hearing loss and sore throat.   ?Eyes:  Negative for blurred vision and redness.  ?Respiratory:  Negative for shortness of breath.   ?Cardiovascular:  Negative for chest pain, palpitations, orthopnea, claudication, leg swelling and PND.  ?Gastrointestinal:  Negative for nausea and vomiting.  ?Genitourinary:  Negative for dysuria and flank pain.  ?Musculoskeletal:  Negative for falls.  ?Neurological:  Negative for dizziness and loss of consciousness.  ?Endo/Heme/Allergies:   Negative for polydipsia.  ?Psychiatric/Behavioral:  Negative for substance abuse.   ? ?EKGs/Labs/Other Studies Reviewed:   ? ?The following studies were reviewed today: ?Echocardiogram 02/24/2020:  ?Left ventricle cavity is normal in size and wall thickness. Normal global  ?wall motion. Normal LV systolic function with EF 61%. Doppler evidence of  ?grade I (impaired) diastolic dysfunction, normal LAP.  ?Mild (Grade I) aortic regurgitation.  ?Normal right atrial pressure.  ?Compared to previous study on 02/23/2019, AI reduced from grade II to  ?grade I. MR is now absent.  ?  ?[name] (Walking with mod Bruce)Tetrofosmin Stress Test  04/05/2019: ?Nondiagnostic ECG stress. ?Perfusion images revealed diaphragmatic attenuation artifact in the inferior wall without evidence of ischemia or scar.  The left ventricle is mildly dilated.  There was no regional wall motion abnormality.  Mild global hypokinesis.  Calculated LVEF moderately reduced at  39%, visually EF appears to be 45%.  Low risk study.   ?Compared 12/08/2017, no significant change in perfusion, however there was significant EKG abnormalities noted associated with chest pain on the treadmill stress test.  Clinical correlation recommended. Low risk study. ?  ?Coronary angiography and intervention 08/04/2018: ?LM: Normal ?LAD: Prox-mid 75% stenosis. CTA FFR 0.75. ?        Diag 1 small vessel with 70% disease ?LCx: Normal ?RCA: Prox 60% stenosis. CTA FFR 0.85 ?  ?Successful percutaneous coronary intervention prox-mid LAD ?PTCA and stent placement Resolute Onyx 3.5 X 22 mm drug-eluting stent ?Recommend medical management for small vessel diag disease, and RCA nonobstructive disease. ?  ? ?Recent Labs: ?06/27/2020: BUN 12; Creatinine, Ser 1.42; Potassium 4.0; Sodium 133  ?Recent Lipid Panel ?   ?Component Value Date/Time  ? CHOL 127 06/15/2019 1228  ? TRIG 71 06/15/2019 1228  ? HDL 60 06/15/2019 1228  ? CHOLHDL 2.1 06/15/2019 1228  ? CHOLHDL 5.9 (H) 11/01/2014 1026  ? VLDL  23 11/01/2014 1026  ? LDLCALC 53 06/15/2019 1228  ? LDLDIRECT 84 09/03/2011 0930  ? ? ? ? ?Physical Exam:   ? ?VS:  There were no vitals taken for this visit.   ? ?Wt Readings from Last 3 Encounters:

## 2021-05-15 NOTE — Progress Notes (Signed)
?Cardiology Office Note:   ? ?Date:  05/16/2021  ? ?ID:  Adam Burton, DOB 1950-12-18, MRN [id number] ? ?PCP:  Adam Rio, MD ?  ?[place]  ?Cardiologist:  Adam Bergeron, MD  ?Advanced Practice Provider:  No care team member to display ?Electrophysiologist:  None  ? ?Referring MD: Adam Rio, MD  ? ? ?History of Present Illness:   ? ?Adam Burton is a 71 y.o. male with a hx of CAD s/p prox-mid LAD DES in 07/2017, HTN, HLD, DMII who presents to clinic for follow-up. ?  ?Patient was previously followed by Adam Burton. During his last visit on 03/03/20, he was doing well. TTE with normal LVEF, G1DD, mild AR. Lexiscan in 02/21 without diaphragmatic attenuation but no ischemia. EF was called low but TTE in 2021 with EF 61%. He was continued on ASA, statin and metop.  ?  ?Seen in clinic on 04/24/20 where he was having episodes of nonexertional chest discomfort and elevated blood pressures at home. We recommended him to start losartan, but he had elevated K on labs prior to starting so therefore we recommended amlodipine. ? ?Had extensive screening in 04/2020 where Carotids were negative for plaque. ABI negative for PAD. No abdominal aortic aneurysm. ? ?Was last seen on 10/2020 where he was doing better. Had cancelled his myoview that was ordered for chest pain as symptoms resolved. Otherwise, was doing well from a CV standpoint.  ? ?Today, he is doing well. His chest pain resolved with stopping the spicy foods. He otherwise is doing very well from a CV standpoint. He stays active with no exertional symptoms. He is compliant with medications. He denies chest pressure, dyspnea at rest or with exertion, palpitations, PND, orthopnea, or leg swelling. ? ?He endorses pain in his R elbow lasting 3 to 4 weeks. He plans to see his PCP for the pain.  ? ?Past Medical History:  ?Diagnosis Date  ? CAD (coronary artery disease)   ? S/p DES to prox to mid LAD in 07/2018 // Myoview  9/22: No ischemia or infarction, EF 59, low risk  ? Diabetes mellitus without complication ([place])   ? Hypercholesteremia   ? Hypertension   ? Renal insufficiency 08/21/2012  ? ? ?Past Surgical History:  ?Procedure Laterality Date  ? CARDIAC CATHETERIZATION  01/2009  ? Adam. [place]. Normal coronary arteries. Normal LV function.  ? CORONARY ANGIOGRAPHY N/A 08/04/2018  ? Procedure: CORONARY ANGIOGRAPHY (CATH LAB);  Surgeon: Adam Mormon, MD;  Location: [facility];  Service: Cardiovascular;  Laterality: N/A;  ? CORONARY STENT INTERVENTION N/A 08/04/2018  ? Procedure: CORONARY STENT INTERVENTION;  Surgeon: Adam Mormon, MD;  Location: [facility];  Service: Cardiovascular;  Laterality: N/A;  ? HERNIA REPAIR    ? NM MYOVIEW LTD  12/2008  ? Potential small area of mild ischemia in the basal inferoseptal wall. Otherwise normal. --> False positive test based on coronary angiography  ? Stress Echocardiogram  12/2013  ? Exercise for just over 4 minutes. Heart rate increased to 148 BPM. Hypertensive response to exercise. EF 60%. No evidence of ischemia. NORMAL STUDY  ? TRANSTHORACIC ECHOCARDIOGRAM  11/2013  ? EF 55-60%. No regional wall motion abnormality is. Normal thickness. Mild LA dilation.  ? ? ?Current Medications: ?Current Meds  ?Medication Sig  ? acetaminophen (TYLENOL) 500 MG tablet Take 2 tablets (1,000 mg total) by mouth every 8 (eight) hours as needed for moderate pain.  ? amLODipine (NORVASC) 10  MG tablet Take 1 tablet (10 mg total) by mouth daily.  ? aspirin EC 81 MG tablet Take 1 tablet (81 mg total) by mouth daily.  ? esomeprazole (NEXIUM) 40 MG capsule Take 1 capsule (40 mg total) by mouth daily at 12 noon.  ? famotidine (PEPCID) 20 MG tablet Take 1 tablet (20 mg total) by mouth 2 (two) times daily.  ? metFORMIN (GLUCOPHAGE-XR) 500 MG 24 hr tablet TAKE 2 TABLETS BY MOUTH ONCE DAILY WITH BREAKFAST  ? metoprolol succinate (TOPROL-XL) 50 MG 24 hr tablet Take 1 tablet (50 mg total) by mouth  daily. Take with or immediately following a meal.  ? nitroGLYCERIN (NITROSTAT) 0.4 MG SL tablet Place 1 tablet (0.4 mg total) under the tongue every 5 (five) minutes as needed for chest pain.  ? rosuvastatin (CRESTOR) 20 MG tablet Take 1 tablet (20 mg total) by mouth daily.  ? tamsulosin (FLOMAX) 0.4 MG CAPS capsule Take 0.4 mg by mouth daily.  ?  ? ?Allergies:   Patient has no known allergies.  ? ?Social History  ? ?Socioeconomic History  ? Marital status: Widowed  ?  Spouse name: Not on file  ? Number of children: 3  ? Years of education: 63  ? Highest education level: 12th grade  ?Occupational History  ? Occupation: Retired Biomedical scientist  ?Tobacco Use  ? Smoking status: Never  ? Smokeless tobacco: Never  ?Vaping Use  ? Vaping Use: Never used  ?Substance and Sexual Activity  ? Alcohol use: Yes  ?  Alcohol/week: 4.0 standard drinks  ?  Types: 4 Standard drinks or equivalent per week  ?  Comment: beer or vodka occasionally  ? Drug use: Yes  ?  Types: Marijuana  ?  Comment: 3-6 X per week  ? Sexual activity: Yes  ?Other Topics Concern  ? Not on file  ?Social History Narrative  ? Patient lives alone in [place].   ? Patient has 3 children, grandchildren, and 1 great grandchild.   ? Patient is a retired Biomedical scientist. Patient enjoys cooking and enjoying his food.   ? Patient walks for exercise when the weather is nice, admits he could do more.   ? ?Social Determinants of Health  ? ?Financial Resource Strain: Low Risk   ? Difficulty of Paying Living Expenses: Not hard at all  ?Food Insecurity: No Food Insecurity  ? Worried About Charity fundraiser in the Last Year: Never true  ? Ran Out of Food in the Last Year: Never true  ?Transportation Needs: No Transportation Needs  ? Lack of Transportation (Medical): No  ? Lack of Transportation (Non-Medical): No  ?Physical Activity: Insufficiently Active  ? Days of Exercise per Week: 3 days  ? Minutes of Exercise per Session: 30 min  ?Stress: No Stress Concern Present  ? Feeling of Stress :  Only a little  ?Social Connections: Moderately Isolated  ? Frequency of Communication with Friends and Family: More than three times a week  ? Frequency of Social Gatherings with Friends and Family: More than three times a week  ? Attends Religious Services: More than 4 times per year  ? Active Member of Clubs or Organizations: No  ? Attends Archivist Meetings: Never  ? Marital Status: Widowed  ?  ? ?Family History: ?The patient's family history includes Diabetes in his maternal aunt, maternal grandmother, mother, and sister; Heart attack in his maternal grandmother and mother. There is no history of Colon cancer, Colon polyps, Esophageal cancer, Rectal cancer, or  Stomach cancer. ? ?ROS:   ?Please see the history of present illness.    ?Review of Systems  ?Constitutional:  Negative for chills and fever.  ?HENT:  Negative for hearing loss and sore throat.   ?Eyes:  Negative for blurred vision and redness.  ?Respiratory:  Negative for shortness of breath.   ?Cardiovascular:  Negative for chest pain, palpitations, orthopnea, claudication, leg swelling and PND.  ?Gastrointestinal:  Positive for heartburn. Negative for nausea and vomiting.  ?Genitourinary:  Negative for dysuria and flank pain.  ?Musculoskeletal:  Positive for joint pain (R elbow). Negative for falls.  ?Neurological:  Negative for dizziness and loss of consciousness.  ?Endo/Heme/Allergies:  Negative for polydipsia.  ?Psychiatric/Behavioral:  Negative for substance abuse.   ? ?EKGs/Labs/Other Studies Reviewed:   ? ?The following studies were reviewed today: ?Myoview 11/22/20 ?  The study is normal. The study is low risk. ?  No ST deviation was noted. ?  LV perfusion is normal. There is no evidence of ischemia. There is no evidence of infarction. ?  Left ventricular function is normal. End diastolic cavity size is normal. ?  Prior study available for comparison from 04/05/2019. No changes compared to prior study. ?  ?Normal study without evidence of  ischemia or infarction.  ?Normal LVEF, 59%. ?Normal exercise capacity (7:00 min:s; 7.7 METS). ?Normal HR/BP response to exercise.  ?This is low-risk study.  ? ?Echocardiogram 02/24/2020:  ?Left ventricl

## 2021-05-16 ENCOUNTER — Ambulatory Visit (INDEPENDENT_AMBULATORY_CARE_PROVIDER_SITE_OTHER): Payer: Medicare Other | Admitting: Cardiology

## 2021-05-16 ENCOUNTER — Encounter: Payer: Self-pay | Admitting: Cardiology

## 2021-05-16 ENCOUNTER — Other Ambulatory Visit: Payer: Self-pay

## 2021-05-16 VITALS — BP 118/80 | HR 70 | Ht 71.0 in | Wt 176.8 lb

## 2021-05-16 DIAGNOSIS — I10 Essential (primary) hypertension: Secondary | ICD-10-CM

## 2021-05-16 DIAGNOSIS — I340 Nonrheumatic mitral (valve) insufficiency: Secondary | ICD-10-CM | POA: Diagnosis not present

## 2021-05-16 DIAGNOSIS — E782 Mixed hyperlipidemia: Secondary | ICD-10-CM

## 2021-05-16 DIAGNOSIS — Z955 Presence of coronary angioplasty implant and graft: Secondary | ICD-10-CM

## 2021-05-16 DIAGNOSIS — R072 Precordial pain: Secondary | ICD-10-CM

## 2021-05-16 DIAGNOSIS — I2510 Atherosclerotic heart disease of native coronary artery without angina pectoris: Secondary | ICD-10-CM | POA: Diagnosis not present

## 2021-05-16 NOTE — Patient Instructions (Signed)
Medication Instructions:  ? ?Your physician recommends that you continue on your current medications as directed. Please refer to the Current Medication list given to you today. ? ?*If you need a refill on your cardiac medications before your next appointment, please call your pharmacy* ? ? ? ?Follow-Up: ?At [facility], you and your health needs are our priority.  As part of our continuing mission to provide you with exceptional heart care, we have created designated Provider Care Teams.  These Care Teams include your primary Cardiologist (physician) and Advanced Practice Providers (APPs -  Physician Assistants and Nurse Practitioners) who all work together to provide you with the care you need, when you need it. ? ?We recommend signing up for the patient portal called "MyChart".  Sign up information is provided on this After Visit Summary.  MyChart is used to connect with patients for Virtual Visits (Telemedicine).  Patients are able to view lab/test results, encounter notes, upcoming appointments, etc.  Non-urgent messages can be sent to your provider as well.   ?To learn more about what you can do with MyChart, go to [email or web address].   ? ?Your next appointment:   ?6 month(s) ? ?The format for your next appointment:   ?In Person ? ?Provider:   ?Freada Bergeron, MD { ? ? ?

## 2021-06-05 ENCOUNTER — Encounter: Payer: Self-pay | Admitting: Family Medicine

## 2021-06-05 ENCOUNTER — Ambulatory Visit (INDEPENDENT_AMBULATORY_CARE_PROVIDER_SITE_OTHER): Payer: Medicare Other | Admitting: Family Medicine

## 2021-06-05 ENCOUNTER — Other Ambulatory Visit: Payer: Self-pay

## 2021-06-05 VITALS — BP 138/86 | HR 71 | Wt 171.0 lb

## 2021-06-05 DIAGNOSIS — M25521 Pain in right elbow: Secondary | ICD-10-CM

## 2021-06-05 DIAGNOSIS — K219 Gastro-esophageal reflux disease without esophagitis: Secondary | ICD-10-CM

## 2021-06-05 DIAGNOSIS — N401 Enlarged prostate with lower urinary tract symptoms: Secondary | ICD-10-CM | POA: Diagnosis not present

## 2021-06-05 DIAGNOSIS — E119 Type 2 diabetes mellitus without complications: Secondary | ICD-10-CM

## 2021-06-05 DIAGNOSIS — R351 Nocturia: Secondary | ICD-10-CM

## 2021-06-05 LAB — POCT GLYCOSYLATED HEMOGLOBIN (HGB A1C): HbA1c, POC (controlled diabetic range): 6.4 % (ref 0.0–7.0)

## 2021-06-05 MED ORDER — TAMSULOSIN HCL 0.4 MG PO CAPS: 0.8000 mg | ORAL_CAPSULE | Freq: Every day | ORAL | 3 refills | Status: AC

## 2021-06-05 NOTE — Assessment & Plan Note (Signed)
Well controlled with A1C of 6.4, will check microalbumin, continue metformin '500mg'$  daily, and work with case manager to follow up on eye exam referral. ?

## 2021-06-05 NOTE — Assessment & Plan Note (Signed)
Controlled. No episodes since he has stopped eating spicy food and is taking famotidine and esomeprazole.  ?

## 2021-06-05 NOTE — Progress Notes (Signed)
?  Date of Visit: 06/05/2021  ? ?SUBJECTIVE:  ? ?HPI: ? ?Keyron is a 71 year old male with a history of HTN, GERD, diabetes (A1C 6.4) presents today for right elbow pain, along with pain and stiffness in right pinky finger.  ? ?Tzion has been experiencing pain in his right elbow that has been constant for the past 2-3 months. He describes the pain as "achey," and the pain is rated at a severity of 8/10. He also describes pain and stiffness in his right 5th digit, which he is unable to curl fully when making a fist. He reports stiffness in his right fifth digit is worse in the morning and improves throughout the day as he moves around. He denies any weakness, or tingling. He has tried wrapping his elbow at night, but does not notice improvement, and does not notice improvement with tylenol.  He does not have any pain or stiffness in other joints.  ? ?Diabetes: Yarden describes walking a lot every day for exercise, cooks most of his meals at home, denies episodes of hypoglycemia, and does not check his home blood sugars. He is taking '500mg'$  of metformin once per day.  ? ?Nighttime urination: He used to get up 5-6 times per night to urinate, but since starting flomax, he has been waking up only 2-3 times per night. He believes there is still room for improvement. Taking flomax 0.'4mg'$  daily ? ?GERD: Since he stopped eating spicy foods, he has not had any symptoms.  ? ? ?OBJECTIVE:  ? ?BP 138/86   Pulse 71   Wt 171 lb (77.6 kg)   SpO2 100%   BMI 23.85 kg/m?  ?Gen: Well-appearing ?HEENT: NCAT ?Lungs: No increased work of breathing. ?Neuro: Alert, normal speech. ?MSK: No deficits in arm, wrist, or finger strength bilaterally. Right arm: no swelling, prominent medial epicondyle that is tender to palpation. Right 5th digit, no swelling, decreased flexion of PIP and DIP, digit tender to palpation. ? ?ASSESSMENT/PLAN:  ? ?Elbow and hand pain:  ?Ordered X-rays of elbow, wrist, and hand, and referred to sports medicine for  further evaluation. Suspect possible ulnar neuropathy. ? ?Gastroesophageal reflux disease ?Controlled. No episodes since he has stopped eating spicy food and is taking famotidine and esomeprazole.  ? ?Type 2 diabetes mellitus ([place]) ?Well controlled with A1C of 6.4, will check microalbumin, continue metformin '500mg'$  daily, and work with case manager to follow up on eye exam referral. ? ?Benign prostatic hyperplasia ?Will increase tamsulosin to 0.'8mg'$  per day and advised patient to f/u with urology. ? ?Health maintenance:  ?Discussed TdAP booster, shingrex, and COVID-19 booster, patient declined.  ?Plan to get lipid panel when patient is fasting.  ? ?FOLLOW UP: ?Follow up in 3 months ? ?Annia Belt, Medical Student ?[place] ? ?Patient seen along with MS3 student Annia Belt. I personally evaluated this patient along with the student, and verified all aspects of the history, physical exam, and medical decision making as documented by the student. I agree with the student's documentation and have made all necessary edits. ? ?Chrisandra Netters, MD  ?[place]   ?

## 2021-06-05 NOTE — Patient Instructions (Signed)
It was great to see you again today! ? ?On your way out, schedule an appointment one morning to come back for fasting labs. Do not eat or drink anything other than water the morning of your lab appointment until after your labs are drawn.  ? ?Sending to sports medicine ?Go get xrays of R arm ? ?Increase flomax to 2 pills per day ?Schedule follow up with urologist ? ?Follow up with me in 3 months, sooner if needed ? ?Be well, ?Dr. Ardelia Mems  ?

## 2021-06-05 NOTE — Assessment & Plan Note (Signed)
Will increase tamsulosin to 0.'8mg'$  per day and advised patient to f/u with urology. ?

## 2021-06-07 ENCOUNTER — Ambulatory Visit (INDEPENDENT_AMBULATORY_CARE_PROVIDER_SITE_OTHER): Payer: Medicare Other | Admitting: Sports Medicine

## 2021-06-07 VITALS — BP 118/72 | Ht 71.0 in | Wt 171.0 lb

## 2021-06-07 DIAGNOSIS — M7701 Medial epicondylitis, right elbow: Secondary | ICD-10-CM | POA: Insufficient documentation

## 2021-06-07 DIAGNOSIS — M79644 Pain in right finger(s): Secondary | ICD-10-CM | POA: Diagnosis not present

## 2021-06-07 NOTE — Progress Notes (Addendum)
PCP: [name], MD ? ?Subjective:  ? ?HPI: ?Adam Burton is a 71 year old gentleman with a past medical history of hypertension, type 2 diabetes, CAD who presents today for evaluation of right elbow pain. ? ?Adam Burton states that for the last several months, he has been experiencing right medial elbow pain.  The pain is present throughout the day, but seems worse when he wakes up and when he is using his elbow while cooking in the kitchen.  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  He does not have any other pain involving the elbow.  He denies noticing any swelling or redness.  He has tried applying CBD cream with some relief that is temporary only.  In addition he has been wearing a thick band that he purchased over-the-counter that immobilizes his elbow.  He denies numbness or tingling down the forearm or into the wrist or hand. ? ?Adam Burton states that he has also been experiencing stiffness of his right fifth digit and is unable to flex his digit as much as he could in the past.  Occasionally when he is gripping an object, he has a lancing type pain that radiates from his pinky to his right medial elbow.  Occasionally in the morning, he notices that his pinky seems stuck in a flexed position and then pops back into place.  He denies any numbness or tingling in his finger. The symptoms have been occurring for approximately 2 months. ? ?Adam Burton denies any neck pain or shoulder pain at this time. ? ?Past Medical History:  ?Diagnosis Date  ? CAD (coronary artery disease)   ? S/p DES to prox to mid LAD in 07/2018 // Myoview 9/22: No ischemia or infarction, EF 59, low risk  ? Diabetes mellitus without complication ([place])   ? Hypercholesteremia   ? Hypertension   ? Renal insufficiency 08/21/2012  ? ? ?Current Outpatient Medications on File Prior to Visit  ?Medication Sig Dispense Refill  ? acetaminophen (TYLENOL) 500 MG tablet Take 2  tablets (1,000 mg total) by mouth every 8 (eight) hours as needed for moderate pain. 30 tablet 0  ? amLODipine (NORVASC) 10 MG tablet Take 1 tablet (10 mg total) by mouth daily. 180 tablet 3  ? aspirin EC 81 MG tablet Take 1 tablet (81 mg total) by mouth daily. 90 tablet 3  ? esomeprazole (NEXIUM) 40 MG capsule Take 1 capsule (40 mg total) by mouth daily at 12 noon. 30 capsule 11  ? famotidine (PEPCID) 20 MG tablet Take 1 tablet (20 mg total) by mouth 2 (two) times daily. 60 tablet 3  ? metFORMIN (GLUCOPHAGE-XR) 500 MG 24 hr tablet TAKE 2 TABLETS BY MOUTH ONCE DAILY WITH BREAKFAST 180 tablet 3  ? metoprolol succinate (TOPROL-XL) 50 MG 24 hr tablet Take 1 tablet (50 mg total) by mouth daily. Take with or immediately following a meal. 90 tablet 3  ? nitroGLYCERIN (NITROSTAT) 0.4 MG SL tablet Place 1 tablet (0.4 mg total) under the tongue every 5 (five) minutes as needed for chest pain. 30 tablet 3  ? rosuvastatin (CRESTOR) 20 MG tablet Take 1 tablet (20 mg total) by mouth daily. 90 tablet 3  ? tamsulosin (FLOMAX) 0.4 MG CAPS capsule Take 2 capsules (0.8 mg total) by mouth daily. 60 capsule 3  ? ?No current facility-administered medications on file prior to visit.  ? ?Past Surgical History:  ?Procedure Laterality Date  ?  CARDIAC CATHETERIZATION  01/2009  ? Dr. Martinique. Normal coronary arteries. Normal LV function.  ? CORONARY ANGIOGRAPHY N/A 08/04/2018  ? Procedure: CORONARY ANGIOGRAPHY (CATH LAB);  Surgeon: Nigel Mormon, MD;  Location: [place] [facility];  Service: Cardiovascular;  Laterality: N/A;  ? CORONARY STENT INTERVENTION N/A 08/04/2018  ? Procedure: CORONARY STENT INTERVENTION;  Surgeon: Nigel Mormon, MD;  Location: [facility];  Service: Cardiovascular;  Laterality: N/A;  ? HERNIA REPAIR    ? NM MYOVIEW LTD  12/2008  ? Potential small area of mild ischemia in the basal inferoseptal wall. Otherwise normal. --> False positive test based on coronary angiography  ? Stress Echocardiogram  12/2013   ? Exercise for just over 4 minutes. Heart rate increased to 148 BPM. Hypertensive response to exercise. EF 60%. No evidence of ischemia. NORMAL STUDY  ? TRANSTHORACIC ECHOCARDIOGRAM  11/2013  ? EF 55-60%. No regional wall motion abnormality is. Normal thickness. Mild LA dilation.  ? ?No Known Allergies ? ?BP 118/72   Ht '5\' 11"'$  (1.803 m)   Wt 171 lb (77.6 kg)   BMI 23.85 kg/m?  ? ?Objective:  ? ?Physical Exam: ? ?Gen: NAD, comfortable in exam room ? ?Right Elbow: ?- Inspection: Medial epicondyle bony prominence enlarged.  No edema or erythema. ?- Palpation: Significant tenderness to palpation of the medial epicondyle.  No other tenderness to palpation. ?- ROM: Full range of motion intact with both flexion and extension.  On wrist flexion, patient notes pain involving the medial epicondyles.  No pain with wrist extension. ?- Strength: 5 out of 5 strength throughout ? ?Right Hand: ?- Inspection: No bony abnormalities noted.  No erythema or edema. ?- Palpation: No tenderness to palpation throughout.   ?- ROM: Fifth digit only with limited flexion.  There is a palpable nodule in the area of the A1 pulley but it is not tender to palpation. ?  ?Assessment & Plan:  ? ?1. Right Elbow pain: Mr. Rundle is presenting with several month history of pain overlying the medial epicondyle.  On examination today, he does have significant tenderness to palpation in addition to pain with wrist flexion.  Given history and examination, most consistent with medial epicondylitis.  We will start with conservative management with counterforce band placement when actively using arm during the daytime, HEP, and topical pain relief creams, including CBD cream or Voltaren gel.  We will avoid any oral NSAIDs given history of cardiovascular disease. ? ?2. Right 5th digit pain: Mr. Boening describes a 22-monthhistory of right fifth digit pain that radiates up to his elbow in addition to stiffness with inability to completely flex.  Although  initially suspicious for ulnar nerve involvement, patient does not have any paresthesias.  He may be developing trigger finger versus arthritis.  We will obtain hand x-rays to evaluate for arthritis and follow-up in 1 month.  Recommended band aid splint of the fifth digit PIP joint at nighttime to avoid flexing of the finger while sleeping. ? ? ?Dr. IJose Persia?Internal Medicine PGY-3  ?06/07/2021, 10:43 AM ? ?Patient seen and evaluated with the resident.  I agree with the above plan of care.  Right elbow medial epicondylitis will be treated with a counterforce brace and a home exercise program.  He will continue with topical Voltaren or CBD cream.  Follow-up with me again in 4 weeks for reevaluation.  We will also reevaluate his right fifth finger stiffness.  Since his symptoms are worse in the morning and improves  throughout the day we will initially start with a Band-Aid splinting at night.  However, if symptoms persist at follow-up then consider merits of cortisone injection.  I would also like for him to proceed with x-rays of his hand as ordered by his PCP to rule out osteoarthritis. ?

## 2021-06-07 NOTE — Patient Instructions (Addendum)
Adam Burton,  ? ?It was a pleasure meeting you today. We talked about two things today.  ? ?Elbow Pain: This is likely what is called Golfer's Elbow, which is inflammation of the tendon. We recommend wearing a brace daily while using your elbow, in addition to home exercises. You can also continue to apply CBD cream as needed. Another cream that can help significant is called Voltaren Gel. This can be purchased over the counter.  ? ?Finger Pain: We would like you to have the X-rays of your hand to evaluate for any arthritis. In addition, begin taping your finger before bedtime to keep it from bending while you sleep.  ? ?Please follow up in 1 month.  ?

## 2021-06-14 ENCOUNTER — Ambulatory Visit
Admission: RE | Admit: 2021-06-14 | Discharge: 2021-06-14 | Disposition: A | Discharge status: Home / Self Care | Payer: Medicare Other | Source: Ambulatory Visit | Attending: Family Medicine | Admitting: Family Medicine

## 2021-06-14 DIAGNOSIS — M25521 Pain in right elbow: Secondary | ICD-10-CM

## 2021-06-14 DIAGNOSIS — M778 Other enthesopathies, not elsewhere classified: Secondary | ICD-10-CM | POA: Diagnosis not present

## 2021-06-14 DIAGNOSIS — M25531 Pain in right wrist: Secondary | ICD-10-CM | POA: Diagnosis not present

## 2021-06-14 DIAGNOSIS — M7989 Other specified soft tissue disorders: Secondary | ICD-10-CM | POA: Diagnosis not present

## 2021-06-14 DIAGNOSIS — M19041 Primary osteoarthritis, right hand: Secondary | ICD-10-CM | POA: Diagnosis not present

## 2021-06-18 ENCOUNTER — Telehealth: Payer: Self-pay | Admitting: Family Medicine

## 2021-06-18 NOTE — Telephone Encounter (Signed)
Patient is calling and would like for Dr. Ardelia Mems to call him with his xray results when they are available  ? ?The best calls back is [phone] ?

## 2021-06-19 ENCOUNTER — Other Ambulatory Visit: Payer: Self-pay | Admitting: Family Medicine

## 2021-06-20 NOTE — Telephone Encounter (Signed)
Attempted to reach patient to discuss results ?No answer, LVM asking him to call back ? ?[name], MD  ?

## 2021-06-26 NOTE — Telephone Encounter (Signed)
Patient is calling back for results. He would like Dr. Ardelia Mems to call him back when she getrs a chance.  ?

## 2021-06-29 NOTE — Telephone Encounter (Signed)
Called patient and reviewed results ?No major findings other than degenerative changes ?He has follow up in place next week with sports medicine and will discuss next options with them ?Patient appreciative of call ?[name], MD  ?

## 2021-06-29 NOTE — Telephone Encounter (Signed)
Patient calling back again regarding these x-ray results. Patient would really like to know these results so he knows what to do next. Please call patient. ?

## 2021-07-02 ENCOUNTER — Telehealth: Payer: Self-pay | Admitting: Cardiology

## 2021-07-02 ENCOUNTER — Other Ambulatory Visit: Payer: Self-pay

## 2021-07-02 DIAGNOSIS — I2510 Atherosclerotic heart disease of native coronary artery without angina pectoris: Secondary | ICD-10-CM

## 2021-07-02 MED ORDER — ROSUVASTATIN CALCIUM 20 MG PO TABS: 20.0000 mg | ORAL_TABLET | Freq: Every day | ORAL | 1 refills | Status: DC

## 2021-07-02 NOTE — Telephone Encounter (Signed)
?*  STAT* If patient is at the pharmacy, call can be transferred to refill team. ? ? ?1. Which medications need to be refilled? (please list name of each medication and dose if known) rosuvastatin (CRESTOR) 20 MG tablet ? ?2. Which pharmacy/location (including street and city if local pharmacy) is medication to be sent to? [place] ([place] - [address] ? ?3. Do they need a 30 day or 90 day supply? 90 ? ?Patient is out of the medication  ?

## 2021-07-05 ENCOUNTER — Ambulatory Visit: Payer: Medicare Other | Admitting: Sports Medicine

## 2021-07-12 DIAGNOSIS — Z961 Presence of intraocular lens: Secondary | ICD-10-CM | POA: Diagnosis not present

## 2021-07-12 DIAGNOSIS — H04123 Dry eye syndrome of bilateral lacrimal glands: Secondary | ICD-10-CM | POA: Diagnosis not present

## 2021-07-12 DIAGNOSIS — H25811 Combined forms of age-related cataract, right eye: Secondary | ICD-10-CM | POA: Diagnosis not present

## 2021-07-12 DIAGNOSIS — E1136 Type 2 diabetes mellitus with diabetic cataract: Secondary | ICD-10-CM | POA: Diagnosis not present

## 2021-07-16 ENCOUNTER — Other Ambulatory Visit: Payer: Self-pay | Admitting: *Deleted

## 2021-07-16 DIAGNOSIS — I10 Essential (primary) hypertension: Secondary | ICD-10-CM

## 2021-07-16 MED ORDER — AMLODIPINE BESYLATE 10 MG PO TABS: 10.0000 mg | ORAL_TABLET | Freq: Every day | ORAL | 0 refills | Status: DC

## 2021-07-25 LAB — HM DIABETES EYE EXAM

## 2021-10-12 ENCOUNTER — Other Ambulatory Visit: Payer: Self-pay

## 2021-10-12 DIAGNOSIS — I10 Essential (primary) hypertension: Secondary | ICD-10-CM

## 2021-10-12 MED ORDER — AMLODIPINE BESYLATE 10 MG PO TABS: 10.0000 mg | ORAL_TABLET | Freq: Every day | ORAL | 2 refills | Status: DC

## 2021-11-18 NOTE — Progress Notes (Deleted)
Cardiology Office Note:    Date:  11/18/2021   ID:  Coyle Stordahl, DOB 03-07-1950, MRN [id number]  PCP:  [name], MD   [place]  Cardiologist:  Freada Bergeron, MD  Advanced Practice Provider:  No care team member to display Electrophysiologist:  None   Referring MD: [name], MD    History of Present Illness:    Adam Burton is a 71 y.o. male with a hx of CAD s/p prox-mid LAD DES in 07/2017, HTN, HLD, DMII who presents to clinic for follow-up.   Patient was previously followed by Dr Virgina Jock. During his last visit on 03/03/20, he was doing well. TTE with normal LVEF, G1DD, mild AR. Lexiscan in 02/21 without diaphragmatic attenuation but no ischemia. EF was called low but TTE in 2021 with EF 61%. He was continued on ASA, statin and metop.    Seen in clinic on 04/24/20 where he was having episodes of nonexertional chest discomfort and elevated blood pressures at home. We recommended him to start losartan, but he had elevated K on labs prior to starting so therefore we recommended amlodipine.  Had extensive screening in 04/2020 where Carotids were negative for plaque. ABI negative for PAD. No abdominal aortic aneurysm.  Was seen in 10/2020 where he was doing better. Had cancelled his myoview that was ordered for chest pain as symptoms resolved. O  Was last seen in clinic on 04/2021 where he was doing well. Chest pain resolved after he cut out spicy foods.   Today, ***  Past Medical History:  Diagnosis Date   CAD (coronary artery disease)    S/p DES to prox to mid LAD in 07/2018 // Myoview 9/22: No ischemia or infarction, EF 59, low risk   Diabetes mellitus without complication ([place])    Hypercholesteremia    Hypertension    Renal insufficiency 08/21/2012    Past Surgical History:  Procedure Laterality Date   CARDIAC CATHETERIZATION  01/2009   Dr. Martinique. Normal coronary arteries. Normal LV function.   CORONARY  ANGIOGRAPHY N/A 08/04/2018   Procedure: CORONARY ANGIOGRAPHY (CATH LAB);  Surgeon: Nigel Mormon, MD;  Location: [facility];  Service: Cardiovascular;  Laterality: N/A;   CORONARY STENT INTERVENTION N/A 08/04/2018   Procedure: CORONARY STENT INTERVENTION;  Surgeon: Nigel Mormon, MD;  Location: [facility];  Service: Cardiovascular;  Laterality: N/A;   HERNIA REPAIR     NM MYOVIEW LTD  12/2008   Potential small area of mild ischemia in the basal inferoseptal wall. Otherwise normal. --> False positive test based on coronary angiography   Stress Echocardiogram  12/2013   Exercise for just over 4 minutes. Heart rate increased to 148 BPM. Hypertensive response to exercise. EF 60%. No evidence of ischemia. NORMAL STUDY   TRANSTHORACIC ECHOCARDIOGRAM  11/2013   EF 55-60%. No regional wall motion abnormality is. Normal thickness. Mild LA dilation.    Current Medications: No outpatient medications have been marked as taking for the 11/22/21 encounter (Appointment) with Freada Bergeron, MD.     Allergies:   Patient has no known allergies.   Social History   Socioeconomic History   Marital status: Widowed    Spouse name: Not on file   Number of children: 3   Years of education: 13   Highest education level: 12th grade  Occupational History   Occupation: Retired Biomedical scientist  Tobacco Use   Smoking status: Never   Smokeless tobacco: Never  Vaping  Use   Vaping Use: Never used  Substance and Sexual Activity   Alcohol use: Yes    Alcohol/week: 4.0 standard drinks of alcohol    Types: 4 Standard drinks or equivalent per week    Comment: beer or vodka occasionally   Drug use: Yes    Types: Marijuana    Comment: 3-6 X per week   Sexual activity: Yes  Other Topics Concern   Not on file  Social History Narrative   Patient lives alone in [place].    Patient has 3 children, grandchildren, and 1 great grandchild.    Patient is a retired Biomedical scientist. Patient enjoys cooking  and enjoying his food.    Patient walks for exercise when the weather is nice, admits he could do more.    Social Determinants of Health   Financial Resource Strain: Low Risk  (12/06/2020)   Overall Financial Resource Strain (CARDIA)    Difficulty of Paying Living Expenses: Not hard at all  Food Insecurity: No Food Insecurity (12/06/2020)   Hunger Vital Sign    Worried About Running Out of Food in the Last Year: Never true    Ran Out of Food in the Last Year: Never true  Transportation Needs: No Transportation Needs (12/06/2020)   PRAPARE - Hydrologist (Medical): No    Lack of Transportation (Non-Medical): No  Physical Activity: Insufficiently Active (12/06/2020)   Exercise Vital Sign    Days of Exercise per Week: 3 days    Minutes of Exercise per Session: 30 min  Stress: No Stress Concern Present (12/06/2020)   [place]    Feeling of Stress : Only a little  Social Connections: Moderately Isolated (12/06/2020)   Social Connection and Isolation Panel [NHANES]    Frequency of Communication with Friends and Family: More than three times a week    Frequency of Social Gatherings with Friends and Family: More than three times a week    Attends Religious Services: More than 4 times per year    Active Member of [employer] or Organizations: No    Attends Archivist Meetings: Never    Marital Status: Widowed     Family History: The patient's family history includes Diabetes in his maternal aunt, maternal grandmother, mother, and sister; Heart attack in his maternal grandmother and mother. There is no history of Colon cancer, Colon polyps, Esophageal cancer, Rectal cancer, or Stomach cancer.  ROS:   Please see the history of present illness.    Review of Systems  Constitutional:  Negative for chills and fever.  HENT:  Negative for hearing loss and sore throat.   Eyes:  Negative for  blurred vision and redness.  Respiratory:  Negative for shortness of breath.   Cardiovascular:  Negative for chest pain, palpitations, orthopnea, claudication, leg swelling and PND.  Gastrointestinal:  Positive for heartburn. Negative for nausea and vomiting.  Genitourinary:  Negative for dysuria and flank pain.  Musculoskeletal:  Positive for joint pain (R elbow). Negative for falls.  Neurological:  Negative for dizziness and loss of consciousness.  Endo/Heme/Allergies:  Negative for polydipsia.  Psychiatric/Behavioral:  Negative for substance abuse.     EKGs/Labs/Other Studies Reviewed:    The following studies were reviewed today: Myoview 11/22/20   The study is normal. The study is low risk.   No ST deviation was noted.   LV perfusion is normal. There is no evidence of ischemia. There is no evidence  of infarction.   Left ventricular function is normal. End diastolic cavity size is normal.   Prior study available for comparison from 04/05/2019. No changes compared to prior study.   Normal study without evidence of ischemia or infarction.  Normal LVEF, 59%. Normal exercise capacity (7:00 min:s; 7.7 METS). Normal HR/BP response to exercise.  This is low-risk study.   Echocardiogram 02/24/2020:  Left ventricle cavity is normal in size and wall thickness. Normal global  wall motion. Normal LV systolic function with EF 61%. Doppler evidence of  grade I (impaired) diastolic dysfunction, normal LAP.  Mild (Grade I) aortic regurgitation.  Normal right atrial pressure.  Compared to previous study on 02/23/2019, AI reduced from grade II to  grade I. MR is now absent.    Lexiscan (Walking with mod Bruce)Tetrofosmin Stress Test  04/05/2019: Nondiagnostic ECG stress. Perfusion images revealed diaphragmatic attenuation artifact in the inferior wall without evidence of ischemia or scar.  The left ventricle is mildly dilated.  There was no regional wall motion abnormality.  Mild global  hypokinesis.  Calculated LVEF moderately reduced at 39%, visually EF appears to be 45%.  Low risk study.   Compared 12/08/2017, no significant change in perfusion, however there was significant EKG abnormalities noted associated with chest pain on the treadmill stress test.  Clinical correlation recommended. Low risk study.   Coronary angiography and intervention 08/04/2018: LM: Normal LAD: Prox-mid 75% stenosis. CTA FFR 0.75.         Diag 1 small vessel with 70% disease LCx: Normal RCA: Prox 60% stenosis. CTA FFR 0.85   Successful percutaneous coronary intervention prox-mid LAD PTCA and stent placement Resolute Onyx 3.5 X 22 mm drug-eluting stent Recommend medical management for small vessel diag disease, and RCA nonobstructive disease.   EKG: EKG was not ordered today  Recent Labs: No results found for requested labs within last 365 days.  Recent Lipid Panel    Component Value Date/Time   CHOL 127 06/15/2019 1228   TRIG 71 06/15/2019 1228   HDL 60 06/15/2019 1228   CHOLHDL 2.1 06/15/2019 1228   CHOLHDL 5.9 (H) 11/01/2014 1026   VLDL 23 11/01/2014 1026   LDLCALC 53 06/15/2019 1228   LDLDIRECT 84 09/03/2011 0930      Physical Exam:    VS:  There were no vitals taken for this visit.    Wt Readings from Last 3 Encounters:  06/07/21 171 lb (77.6 kg)  06/05/21 171 lb (77.6 kg)  05/16/21 176 lb 12.8 oz (80.2 kg)     GEN:  Well nourished, well developed in no acute distress HEENT: Normal NECK: No JVD; No carotid bruits CARDIAC: RRR, 1/6 soft systolic murmur. No rubs or gallops RESPIRATORY:  Clear to auscultation without rales, wheezing or rhonchi  ABDOMEN: Soft, non-tender, non-distended MUSCULOSKELETAL:  No edema; No deformity  SKIN: Warm and dry NEUROLOGIC:  Alert and oriented x 3 PSYCHIATRIC:  Normal affect   ASSESSMENT:    No diagnosis found.   PLAN:    In order of problems listed above:  #Known CAD s/p prox-mid PCI of LAD: S/p DES with Resolute Onyx 3.5 X  22 mm (07/2018), 70% diag 1 and 60% prox RCA disease (dFR 0.85). Lexiscan in 03/2019 without ischemia. TTE in 01/2020 with LVEF 61%, G1 DD, mild AR. Doing well without anginal symptoms.  -Continue ASA '81mg'$  daily -Continue Crestor '20mg'$  daily -Continue metop succinate '50mg'$  daily -No ACE/ARB due to hyperK (5.3)   #Noncardiac Chest Pain: Improved. Patient with chest discomfort  after eating/drinking. No exertional symptoms and patient able to walk 2-3 miles without issues. Myoview negative for ischemia. TTE with normal EF, no WMA. -Continue nexium and pepcid   #HTN: Controlled and at goal. -Continue metop '50mg'$  daily -Continue amlodipine '10mg'$  daily -Low Na diet   #HLD: Well controlled. LDL 53. -Continue crestor '20mg'$  daily -Followed by PCP; repeat labs at next visit   #DMII: -Continue metformin   #Mild AR: -Monitor with serial TTEs (next echo 2024)   Medication Adjustments/Labs and Tests Ordered: Current medicines are reviewed at length with the patient today.  Concerns regarding medicines are outlined above.  No orders of the defined types were placed in this encounter.  No orders of the defined types were placed in this encounter.   There are no Patient Instructions on file for this visit.   Signed, Freada Bergeron, MD  11/18/2021 7:54 PM    [place]

## 2021-11-22 ENCOUNTER — Encounter: Payer: Self-pay | Admitting: Physician Assistant

## 2021-11-22 ENCOUNTER — Ambulatory Visit: Payer: Medicare Other | Attending: Cardiology | Admitting: Physician Assistant

## 2021-11-22 ENCOUNTER — Ambulatory Visit: Payer: Medicare Other | Admitting: Cardiology

## 2021-11-22 VITALS — BP 120/76 | HR 57 | Ht 71.0 in | Wt 165.4 lb

## 2021-11-22 DIAGNOSIS — E785 Hyperlipidemia, unspecified: Secondary | ICD-10-CM

## 2021-11-22 DIAGNOSIS — I10 Essential (primary) hypertension: Secondary | ICD-10-CM | POA: Diagnosis not present

## 2021-11-22 DIAGNOSIS — E119 Type 2 diabetes mellitus without complications: Secondary | ICD-10-CM

## 2021-11-22 DIAGNOSIS — I351 Nonrheumatic aortic (valve) insufficiency: Secondary | ICD-10-CM

## 2021-11-22 DIAGNOSIS — Z79899 Other long term (current) drug therapy: Secondary | ICD-10-CM

## 2021-11-22 DIAGNOSIS — I2510 Atherosclerotic heart disease of native coronary artery without angina pectoris: Secondary | ICD-10-CM

## 2021-11-22 MED ORDER — ROSUVASTATIN CALCIUM 20 MG PO TABS: 20.0000 mg | ORAL_TABLET | Freq: Every day | ORAL | 1 refills | Status: DC

## 2021-11-22 NOTE — Progress Notes (Signed)
Office Visit    Patient Name: Adam Burton Date of Encounter: 11/22/2021  PCP:  Adam Burton, [place]  Cardiologist:  Adam Bergeron, MD  Advanced Practice Provider:  No care team member to display Electrophysiologist:  None   HPI    Adam Burton is a 71 y.o. male past medical history significant for CAD status post proximal to mid LAD DES in 07/2017, hypertension, hyperlipidemia, diabetes mellitus type 2 presents today for 57-monthfollow-up.  The patient was previously followed by Dr. PVirgina Burton  He was seen 03/03/2020 and was doing well at that time.  TTE with normal LVEF, G1 DD, mild AR.  Lexi scan in 2/21 without diaphragmatic attenuation but no ischemia.  EF was called low but TTE in 2021 with EF 61%.  He was continued on ASA, statin, metoprolol.  Seen in the clinic 04/24/2020 where he was having episodes of nonexertional chest discomfort and elevated blood pressures at home.  We recommended him to start losartan but he had elevated K on labs prior to starting so therefore recommended amlodipine.  Extensive screening 04/2020 were carotids were negative for plaque.  ABIs were negative for PAD.  No abdominal aortic aneurysm.  Seen 10/2020 and was doing better.  Canceled Myoview since chest pain symptoms resolved.  Last seen 04/2021 and was doing well.  Chest pain resolved with stopping spicy foods.  Stays active with no exertional symptoms.  Compliant with all medications.  Today, he tells me he has not had any more pains, he does have a twinge every now and then which is associated with food.  He has some occasional shortness of breath when he walks up the stairs but this is not new for him.  He has not had any swelling in his legs.  He plans to do a lipid panel when he sees primary next month.  His biggest issue has been liquid accumulating in the back of his mouth and he is having to scrape his tongue.  He is not sure what is causing it and I  am not sure either.  I asked him to discuss this with his primary care.  He does state that he has some fatigue when walking and his legs get tired.  Last echocardiogram in 2018 which was a stress echo.  We discussed potentially repeating an echocardiogram but he would like to hold off at this time.  Reports no chest pain, pressure, or tightness. No edema, orthopnea, PND. Reports no palpitations.     Past Medical History    Past Medical History:  Diagnosis Date   CAD (coronary artery disease)    S/p DES to prox to mid LAD in 07/2018 // Myoview 9/22: No ischemia or infarction, EF 59, low risk   Diabetes [place] complication [place]    Hypercholesteremia    Hypertension    Renal insufficiency 08/21/2012   Past Surgical History:  Procedure Laterality Date   CARDIAC CATHETERIZATION  01/2009   Dr. JMartinique Normal coronary arteries. Normal LV function.   CORONARY ANGIOGRAPHY N/A 08/04/2018   Procedure: CORONARY ANGIOGRAPHY (CATH LAB);  Surgeon: Adam Mormon MD;  [facility];  Service: Cardiovascular;  Laterality: N/A;   CORONARY STENT INTERVENTION N/A 08/04/2018   Procedure: CORONARY STENT INTERVENTION;  Surgeon: Adam Mormon MD;  [facility];  Service: Cardiovascular;  Laterality: N/A;   HERNIA REPAIR     NM MYOVIEW LTD  12/2008  Potential small area of mild ischemia in the basal inferoseptal wall. Otherwise normal. --> False positive test based on coronary angiography   Stress Echocardiogram  12/2013   Exercise for just over 4 minutes. Heart rate increased to 148 BPM. Hypertensive response to exercise. EF 60%. No evidence of ischemia. NORMAL STUDY   TRANSTHORACIC ECHOCARDIOGRAM  11/2013   EF 55-60%. No regional wall motion abnormality is. Normal thickness. Mild LA dilation.    Allergies  No Known Allergies   EKGs/Labs/Other Studies Reviewed:   The following studies were reviewed today:   Myoview 11/22/20   The study is  normal. The study is low risk.   No ST deviation was noted.   LV perfusion is normal. There is no evidence of ischemia. There is no evidence of infarction.   Left ventricular function is normal. End diastolic cavity size is normal.   Prior study available for comparison from 04/05/2019. No changes compared to prior study.   Normal study without evidence of ischemia or infarction.  Normal LVEF, 59%. Normal exercise capacity (7:00 min:s; 7.7 METS). Normal HR/BP response to exercise.  This is low-risk study.    Echocardiogram 02/24/2020:  Left ventricle cavity is normal in size and wall thickness. Normal global  wall motion. Normal LV systolic function with EF 61%. Doppler evidence of  grade I (impaired) diastolic dysfunction, normal LAP.  Mild (Grade I) aortic regurgitation.  Normal right atrial pressure.  Compared to previous study on 02/23/2019, AI reduced from grade II to  grade I. MR is now absent.    Lexiscan (Walking with mod Bruce)Tetrofosmin Stress Test  04/05/2019: Nondiagnostic ECG stress. Perfusion images revealed diaphragmatic attenuation artifact in the inferior wall without evidence of ischemia or scar.  The left ventricle is mildly dilated.  There was no regional wall motion abnormality.  Mild global hypokinesis.  Calculated LVEF moderately reduced at 39%, visually EF appears to be 45%.  Low risk study.   Compared 12/08/2017, no significant change in perfusion, however there was significant EKG abnormalities noted associated with chest pain on the treadmill stress test.  Clinical correlation recommended. Low risk study.   Coronary angiography and intervention 08/04/2018: LM: Normal LAD: Prox-mid 75% stenosis. CTA FFR 0.75.         Diag 1 small vessel with 70% disease LCx: Normal RCA: Prox 60% stenosis. CTA FFR 0.85   Successful percutaneous coronary intervention prox-mid LAD PTCA and stent placement Resolute Onyx 3.5 X 22 mm drug-eluting stent Recommend medical management  for small vessel diag disease, and RCA nonobstructive disease.    EKG:  EKG is  ordered today.  The ekg ordered today demonstrates sinus bradycardia, rate 57 bpm, T wave abnormality (unchanged from previous EKG)  Recent Labs: No results found for requested labs within last 365 days.  Recent Lipid Panel    Component Value Date/Time   CHOL 127 06/15/2019 1228   TRIG 71 06/15/2019 1228   HDL 60 06/15/2019 1228   CHOLHDL 2.1 06/15/2019 1228   CHOLHDL 5.9 (H) 11/01/2014 1026   VLDL 23 11/01/2014 1026   LDLCALC 53 06/15/2019 1228   LDLDIRECT 84 09/03/2011 0930    Home Medications   Current Meds  Medication Sig   acetaminophen (TYLENOL) 500 MG tablet Take 2 tablets (1,000 mg total) by mouth every 8 (eight) hours as needed for moderate pain.   amLODipine (NORVASC) 10 MG tablet Take 1 tablet (10 mg total) by mouth daily.   aspirin EC 81 MG tablet Take 1 tablet (81  mg total) by mouth daily.   esomeprazole (NEXIUM) 40 MG capsule Take 1 capsule (40 mg total) by mouth daily at 12 noon.   metFORMIN (GLUCOPHAGE-XR) 500 MG 24 hr tablet Take 1 tablet (500 mg total) by mouth daily with breakfast.   metoprolol succinate (TOPROL-XL) 50 MG 24 hr tablet Take 1 tablet (50 mg total) by mouth daily. Take with or immediately following a meal.   nitroGLYCERIN (NITROSTAT) 0.4 MG SL tablet Place 1 tablet (0.4 mg total) under the tongue every 5 (five) minutes as needed for chest pain.   tamsulosin (FLOMAX) 0.4 MG CAPS capsule Take 2 capsules (0.8 mg total) by mouth daily.   [DISCONTINUED] rosuvastatin (CRESTOR) 20 MG tablet Take 1 tablet (20 mg total) by mouth daily.     Review of Systems      All other systems reviewed and are otherwise negative except as noted above.  Physical Exam    VS:  BP 120/76 (BP Location: Left Arm, Patient Position: Sitting, Cuff Size: Normal)   Pulse (!) 57   Ht '5\' 11"'$  (1.803 m)   Wt 165 lb 6.4 oz (75 kg)   SpO2 97%   BMI 23.07 kg/m  , BMI Body mass index is 23.07  kg/m.  Wt Readings from Last 3 Encounters:  11/22/21 165 lb 6.4 oz (75 kg)  06/07/21 171 lb (77.6 kg)  06/05/21 171 lb (77.6 kg)     GEN: Well nourished, well developed, in no acute distress. HEENT: normal. Neck: Supple, no JVD, carotid bruits, or masses. Cardiac: RRR, no murmurs, rubs, or gallops. No clubbing, cyanosis, edema.  Radials/PT 2+ and equal bilaterally.  Respiratory:  Respirations regular and unlabored, clear to auscultation bilaterally. GI: Soft, nontender, nondistended. MS: No deformity or atrophy. Skin: Warm and dry, no rash. Neuro:  Strength and sensation are intact. Psych: Normal affect.  Assessment & Plan    Known CAD status post proximal to mid PCI of LAD/noncardiac chest pain -No real chest pain but does endorse a twinge every now and then associated with food.  This does not seem particularly cardiac -Continue Norvasc 10 mg daily, aspirin 81 mg daily, metoprolol succinate 50 mg daily, Crestor 20 mg daily, and nitro as needed   Hypertension -well controlled today -continue current medication regimen  Hyperlipidemia -send [place] lipid panel from PCP next month  Mild AR -would like to update Echo, patient is thinking about this  Diabetes mellitus type 2 -Last A1c 6.4 -Per PCP        Disposition: Follow up 6 months with Adam Bergeron, MD or APP.  Signed, Elgie Collard, PA-C 11/22/2021, 2:45 PM [facility]

## 2021-11-22 NOTE — Patient Instructions (Signed)
Medication Instructions:  Your physician recommends that you continue on your current medications as directed. Please refer to the Current Medication list given to you today.  *If you need a refill on your cardiac medications before your next appointment, please call your pharmacy*   Lab Work: Have your primary care provider draw a lipid panel when you see them next month. Ask them to fax the results to [place] [phone]. If you have labs (blood work) drawn today and your tests are completely normal, you will receive your results only by: [place] (if you have MyChart) OR A paper copy in the mail If you have any lab test that is abnormal or we need to change your treatment, we will call you to review the results.  Follow-Up: At [facility], you and your health needs are our priority.  As part of our continuing mission to provide you with exceptional heart care, we have created designated Provider Care Teams.  These Care Teams include your primary Cardiologist (physician) and Advanced Practice Providers (APPs -  Physician Assistants and Nurse Practitioners) who all work together to provide you with the care you need, when you need it.  We recommend signing up for the patient portal called "MyChart".  Sign up information is provided on this After Visit Summary.  MyChart is used to connect with patients for Virtual Visits (Telemedicine).  Patients are able to view lab/test results, encounter notes, upcoming appointments, etc.  Non-urgent messages can be sent to your provider as well.   To learn more about what you can do with MyChart, go to [email or web address].    Your next appointment:   6 month(s)  The format for your next appointment:   In Person  Provider:   Freada Bergeron, MD  or Nicholes Rough, PA-C       Important Information About Sugar

## 2021-12-11 ENCOUNTER — Ambulatory Visit (INDEPENDENT_AMBULATORY_CARE_PROVIDER_SITE_OTHER): Payer: Medicare Other | Admitting: Family Medicine

## 2021-12-11 VITALS — BP 125/83 | HR 62 | Wt 165.6 lb

## 2021-12-11 DIAGNOSIS — E782 Mixed hyperlipidemia: Secondary | ICD-10-CM

## 2021-12-11 DIAGNOSIS — N401 Enlarged prostate with lower urinary tract symptoms: Secondary | ICD-10-CM | POA: Diagnosis not present

## 2021-12-11 DIAGNOSIS — K219 Gastro-esophageal reflux disease without esophagitis: Secondary | ICD-10-CM | POA: Diagnosis not present

## 2021-12-11 DIAGNOSIS — E119 Type 2 diabetes mellitus without complications: Secondary | ICD-10-CM

## 2021-12-11 DIAGNOSIS — I10 Essential (primary) hypertension: Secondary | ICD-10-CM

## 2021-12-11 DIAGNOSIS — R351 Nocturia: Secondary | ICD-10-CM

## 2021-12-11 DIAGNOSIS — R6889 Other general symptoms and signs: Secondary | ICD-10-CM

## 2021-12-11 DIAGNOSIS — K5900 Constipation, unspecified: Secondary | ICD-10-CM

## 2021-12-11 DIAGNOSIS — R1312 Dysphagia, oropharyngeal phase: Secondary | ICD-10-CM | POA: Diagnosis not present

## 2021-12-11 LAB — POCT GLYCOSYLATED HEMOGLOBIN (HGB A1C): HbA1c, POC (prediabetic range): 6 % (ref 5.7–6.4)

## 2021-12-11 MED ORDER — FAMOTIDINE 20 MG PO TABS: 20.0000 mg | ORAL_TABLET | Freq: Two times a day (BID) | ORAL | 3 refills | Status: DC

## 2021-12-11 MED ORDER — POLYETHYLENE GLYCOL 3350 17 GM/SCOOP PO POWD: 17.0000 g | Freq: Every day | ORAL | 2 refills | Status: DC | PRN

## 2021-12-11 NOTE — Progress Notes (Unsigned)
  Date of Visit: 12/11/2021   SUBJECTIVE:   HPI:  Adam Burton presents today for routine follow up.  Hypertension - taking amlodipine '10mg'$  daily and metoprolol XL 50 mg daily  GERD: Taking Nexium 40 mg daily.  Has noticed some heaviness or thickness in the back of his tongue for some time.  This especially happens when he swallows.  Feels like things are getting stuck on the back of his tongue.  He does not use tobacco products but has smoked marijuana for about 50 years.  Diabetes: Taking metformin XR 500 mg daily.  A1c today is 6.0.  Hyperlipidemia: Taking Crestor 20 mg daily.  Agreeable to checking lipid panel today.  BPH: Taking Flomax 0.4 mg twice a day.  Last saw urology Dr. Lovena Neighbours in February 2022.  He is agreeable to going back and seeing Dr. Lovena Neighbours again.  He is having frequent nocturia.  Noticed some "lancing" feeling on his left chest.  This occurs primarily when he drinks cold beverages.  He mentioned this to his cardiologist when he saw the NP recently and they felt this was not cardiac in etiology.  Constipation: Has noted increased constipation over the last few weeks.  No blood in his stools.  His stools are hard and he is having to strain.   OBJECTIVE:   BP 125/83   Pulse 62   Wt 165 lb 9.6 oz (75.1 kg)   SpO2 100%   BMI 23.10 kg/m  Gen: no acute distress, pleasant, cooperative HEENT: normocephalic, atraumatic. Oropharynx clear and moist. Some larger papillae on back of tongue but appears within normal range. No anterior cervical lymphadenopathy.  Heart: regular rate and rhythm no murmur Lungs: clear to auscultation bilaterally, normal work of breathing  Neuro: alert, speech normal, grossly nonfocal Ext: No appreciable lower extremity edema bilaterally   ASSESSMENT/PLAN:   Health maintenance:  -Declines flu shot today -Imms to get updated COVID booster at [facility] -Declines Tdap vaccine today -Declines Shingrix vaccine second dose  Benign prostatic  hyperplasia Encouraged follow-up with urology given ongoing symptoms.  Continue Flomax for now.  Essential hypertension, benign Well controlled. Continue current medication regimen.   Gastroesophageal reflux disease Unclear if symptoms reported by patient concerning the back of his tongue are related to reflux.  I do suspect that there is a component of reflux contributing to the left-sided chest pain.  We will continue PPI and also add H2 blocker.  I am going to refer him to ENT for evaluation given his significant smoking history and throat symptoms.  He is agreeable to this plan.  Mixed hyperlipidemia Check lipids today.  Continue statin.  Type 2 diabetes mellitus ([place]) Very well controlled with A1c of 6.0.  Continue metformin.  Check renal function today with basic metabolic panel and urine microalbumin to creatinine ratio.  Constipation Trial of MiraLAX to see if this helps.  Current on colonoscopy.  FOLLOW UP: Follow up in 6 mos for above issues Referring to ENT Schedule follow up with urology  Adam Burton. Adam Burton, [place]

## 2021-12-11 NOTE — Patient Instructions (Addendum)
It was great to see you again today!  Checking cholesterol and kidneys today Start miralax daily as needed for constipation Schedule with urologist  Referring to ENT for the issue at the back of your throat  For the twinges after drinking cold liquids, let's try adding pepcid to see if it helps  Follow up with me in 6 months, sooner if not improving with these medications.  Be well, Dr. Ardelia Mems

## 2021-12-12 DIAGNOSIS — K5900 Constipation, unspecified: Secondary | ICD-10-CM | POA: Insufficient documentation

## 2021-12-12 LAB — LIPID PANEL
Chol/HDL Ratio: 1.8 ratio (ref 0.0–5.0)
Cholesterol, Total: 119 mg/dL (ref 100–199)
HDL: 66 mg/dL (ref >39)
LDL Chol Calc (NIH): 37 mg/dL (ref 0–99)
Triglycerides: 81 mg/dL (ref 0–149)
VLDL Cholesterol Cal: 16 mg/dL (ref 5–40)

## 2021-12-12 LAB — MICROALBUMIN / CREATININE URINE RATIO
Creatinine, Urine: 147.5 mg/dL
Microalb/Creat Ratio: 19 mg/g creat (ref 0–29)
Microalbumin, Urine: 28.7 ug/mL

## 2021-12-12 LAB — BASIC METABOLIC PANEL
BUN/Creatinine Ratio: 10 (ref 10–24)
BUN: 12 mg/dL (ref 8–27)
CO2: 23 mmol/L (ref 20–29)
Calcium: 9.5 mg/dL (ref 8.6–10.2)
Chloride: 101 mmol/L (ref 96–106)
Creatinine, Ser: 1.17 mg/dL (ref 0.76–1.27)
Glucose: 81 mg/dL (ref 70–99)
Potassium: 4.5 mmol/L (ref 3.5–5.2)
Sodium: 138 mmol/L (ref 134–144)
eGFR: 66 mL/min/{1.73_m2} (ref >59)

## 2021-12-12 NOTE — Assessment & Plan Note (Signed)
Trial of MiraLAX to see if this helps.  Current on colonoscopy.

## 2021-12-12 NOTE — Assessment & Plan Note (Signed)
Very well controlled with A1c of 6.0.  Continue metformin.  Check renal function today with basic metabolic panel and urine microalbumin to creatinine ratio.

## 2021-12-12 NOTE — Assessment & Plan Note (Signed)
Unclear if symptoms reported by patient concerning the back of his tongue are related to reflux.  I do suspect that there is a component of reflux contributing to the left-sided chest pain.  We will continue PPI and also add H2 blocker.  I am going to refer him to ENT for evaluation given his significant smoking history and throat symptoms.  He is agreeable to this plan.

## 2021-12-12 NOTE — Assessment & Plan Note (Signed)
Well controlled. Continue current medication regimen.  

## 2021-12-12 NOTE — Assessment & Plan Note (Signed)
Encouraged follow-up with urology given ongoing symptoms.  Continue Flomax for now.

## 2021-12-12 NOTE — Assessment & Plan Note (Signed)
Check lipids today.  Continue statin.

## 2021-12-13 ENCOUNTER — Other Ambulatory Visit: Payer: Self-pay | Admitting: *Deleted

## 2021-12-13 DIAGNOSIS — I2510 Atherosclerotic heart disease of native coronary artery without angina pectoris: Secondary | ICD-10-CM

## 2021-12-13 MED ORDER — ROSUVASTATIN CALCIUM 20 MG PO TABS: 20.0000 mg | ORAL_TABLET | Freq: Every day | ORAL | 1 refills | Status: DC

## 2021-12-24 ENCOUNTER — Other Ambulatory Visit: Payer: Self-pay

## 2021-12-24 MED ORDER — ESOMEPRAZOLE MAGNESIUM 40 MG PO CPDR: 40.0000 mg | DELAYED_RELEASE_CAPSULE | Freq: Every day | ORAL | 3 refills | Status: DC

## 2021-12-26 ENCOUNTER — Encounter: Payer: Self-pay | Admitting: Family Medicine

## 2022-01-11 ENCOUNTER — Telehealth: Payer: Self-pay

## 2022-01-11 NOTE — Patient Outreach (Signed)
  Care Coordination   01/11/2022 Name: Ryland Tungate MRN: [id number] DOB: 12-20-1950   Care Coordination Outreach Attempts:  An unsuccessful telephone outreach was attempted today to offer the patient information about available care coordination services as a benefit of their health plan.   Follow Up Plan:  Additional outreach attempts will be made to offer the patient care coordination information and services.   Encounter Outcome:  No Answer  Care Coordination Interventions Activated:  No   Care Coordination Interventions:  No, not indicated    Johnney Killian, RN, BSN, CCM Care Management Coordinator [facility] Phone: [phone]: [phone]

## 2022-01-25 ENCOUNTER — Other Ambulatory Visit: Payer: Self-pay

## 2022-01-25 DIAGNOSIS — I10 Essential (primary) hypertension: Secondary | ICD-10-CM

## 2022-01-25 MED ORDER — METOPROLOL SUCCINATE ER 50 MG PO TB24: 50.0000 mg | ORAL_TABLET | Freq: Every day | ORAL | 2 refills | Status: DC

## 2022-02-11 ENCOUNTER — Telehealth: Payer: Self-pay

## 2022-02-11 NOTE — Patient Outreach (Signed)
  Care Coordination   02/11/2022 Name: Adam Burton MRN: [id number] DOB: Feb 29, 1950   Care Coordination Outreach Attempts:  An unsuccessful telephone outreach was attempted today to offer the patient information about available care coordination services as a benefit of their health plan.   Follow Up Plan:  Additional outreach attempts will be made to offer the patient care coordination information and services.   Encounter Outcome:  No Answer   Care Coordination Interventions:  No, not indicated    Jone Baseman, RN, MSN [facility] Care Management Care Management Coordinator Direct Line [phone] '

## 2022-02-21 ENCOUNTER — Other Ambulatory Visit: Payer: Self-pay

## 2022-02-21 DIAGNOSIS — I2510 Atherosclerotic heart disease of native coronary artery without angina pectoris: Secondary | ICD-10-CM

## 2022-02-21 MED ORDER — ROSUVASTATIN CALCIUM 20 MG PO TABS: 20.0000 mg | ORAL_TABLET | Freq: Every day | ORAL | 2 refills | Status: DC

## 2022-03-17 ENCOUNTER — Other Ambulatory Visit: Payer: Self-pay | Admitting: Family Medicine

## 2022-04-17 ENCOUNTER — Other Ambulatory Visit: Payer: Self-pay

## 2022-04-17 DIAGNOSIS — I10 Essential (primary) hypertension: Secondary | ICD-10-CM

## 2022-04-17 MED ORDER — AMLODIPINE BESYLATE 10 MG PO TABS: 10.0000 mg | ORAL_TABLET | Freq: Every day | ORAL | 2 refills | Status: DC

## 2022-05-12 NOTE — Progress Notes (Unsigned)
Cardiology Office Note:    Date:  05/16/2022   ID:  Adam Burton, DOB Oct 01, 1950, MRN [id number]  PCP:  [name], MD   [place]  Cardiologist:  Freada Bergeron, MD  Advanced Practice Provider:  No care team member to display Electrophysiologist:  None   Referring MD: [name], MD    History of Present Illness:    Adam Burton is a 72 y.o. male with a hx of CAD s/p prox-mid LAD DES in 07/2017, HTN, HLD, DMII who presents to clinic for follow-up.   Patient was previously followed by Dr Virgina Jock. During his last visit on 03/03/20, he was doing well. TTE with normal LVEF, G1DD, mild AR. Lexiscan in 02/21 without diaphragmatic attenuation but no ischemia. EF was called low but TTE in 2021 with EF 61%. He was continued on ASA, statin and metop.    Seen in clinic on 04/24/20 where he was having episodes of nonexertional chest discomfort and elevated blood pressures at home. We recommended him to start losartan, but he had elevated K on labs prior to starting so therefore we recommended amlodipine.  Had extensive screening in 04/2020 where Carotids were negative for plaque. ABI negative for PAD. No abdominal aortic aneurysm.  Was last seen on 10/2021 where he was doing well from a CV standpoint.  Today, the patient feels well. Has occasional heart flutters but last a couple of seconds before abating. No associated lightheadedness, dizziness or SOB. Remains active very active and feels well with activity. 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 No orthopnea, PND, LE edema, syncope. Tolerating medications as prescribed.   Past Medical History:  Diagnosis Date   CAD (coronary artery disease)    S/p DES to prox to mid LAD in 07/2018 // Myoview 9/22: No ischemia or infarction, EF 59, low risk   Diabetes mellitus without complication ([place])    Hypercholesteremia    Hypertension    Renal insufficiency  08/21/2012    Past Surgical History:  Procedure Laterality Date   CARDIAC CATHETERIZATION  01/2009   Dr. [place]. Normal coronary arteries. Normal LV function.   CORONARY ANGIOGRAPHY N/A 08/04/2018   Procedure: CORONARY ANGIOGRAPHY (CATH LAB);  Surgeon: Nigel Mormon, MD;  Location: [facility];  Service: Cardiovascular;  Laterality: N/A;   CORONARY STENT INTERVENTION N/A 08/04/2018   Procedure: CORONARY STENT INTERVENTION;  Surgeon: Nigel Mormon, MD;  Location: [facility];  Service: Cardiovascular;  Laterality: N/A;   HERNIA REPAIR     NM MYOVIEW LTD  12/2008   Potential small area of mild ischemia in the basal inferoseptal wall. Otherwise normal. --> False positive test based on coronary angiography   Stress Echocardiogram  12/2013   Exercise for just over 4 minutes. Heart rate increased to 148 BPM. Hypertensive response to exercise. EF 60%. No evidence of ischemia. NORMAL STUDY   TRANSTHORACIC ECHOCARDIOGRAM  11/2013   EF 55-60%. No regional wall motion abnormality is. Normal thickness. Mild LA dilation.    Current Medications: Current Meds  Medication Sig   acetaminophen (TYLENOL) 500 MG tablet Take 2 tablets (1,000 mg total) by mouth every 8 (eight) hours as needed for moderate pain.   amLODipine (NORVASC) 10 MG tablet Take 1 tablet (10 mg total) by mouth daily.   aspirin EC 81 MG tablet Take 1 tablet (81 mg total) by mouth daily.   esomeprazole (NEXIUM) 40 MG capsule Take 1  capsule (40 mg total) by mouth daily at 12 noon.   famotidine (PEPCID) 20 MG tablet Take 1 tablet (20 mg total) by mouth 2 (two) times daily.   metFORMIN (GLUCOPHAGE-XR) 500 MG 24 hr tablet TAKE 1 TABLET BY MOUTH DAILY  WITH BREAKFAST   metoprolol succinate (TOPROL-XL) 50 MG 24 hr tablet Take 1 tablet (50 mg total) by mouth daily. Take with or immediately following a meal.   nitroGLYCERIN (NITROSTAT) 0.4 MG SL tablet Place 1 tablet (0.4 mg total) under the tongue every 5 (five) minutes  as needed for chest pain.   polyethylene glycol powder (MIRALAX) 17 GM/SCOOP powder Take 17 g by mouth daily as needed (constipation).   rosuvastatin (CRESTOR) 20 MG tablet Take 1 tablet (20 mg total) by mouth daily.   tamsulosin (FLOMAX) 0.4 MG CAPS capsule Take 2 capsules (0.8 mg total) by mouth daily.     Allergies:   Patient has no known allergies.   Social History   Socioeconomic History   Marital status: Widowed    Spouse name: Not on file   Number of children: 3   Years of education: 82   Highest education level: 12th grade  Occupational History   Occupation: Retired Biomedical scientist  Tobacco Use   Smoking status: Never   Smokeless tobacco: Never  Vaping Use   Vaping Use: Never used  Substance and Sexual Activity   Alcohol use: Yes    Alcohol/week: 4.0 standard drinks of alcohol    Types: 4 Standard drinks or equivalent per week    Comment: beer or vodka occasionally   Drug use: Yes    Types: Marijuana    Comment: 3-6 X per week   Sexual activity: Yes  Other Topics Concern   Not on file  Social History Narrative   Patient lives alone in [place].    Patient has 3 children, grandchildren, and 1 great grandchild.    Patient is a retired Biomedical scientist. Patient enjoys cooking and enjoying his food.    Patient walks for exercise when the weather is nice, admits he could do more.    Social Determinants of Health   Financial Resource Strain: Low Risk  (12/06/2020)   Overall Financial Resource Strain (CARDIA)    Difficulty of Paying Living Expenses: Not hard at all  Food Insecurity: No Food Insecurity (12/06/2020)   Hunger Vital Sign    Worried About Running Out of Food in the Last Year: Never true    Ran Out of Food in the Last Year: Never true  Transportation Needs: No Transportation Needs (12/06/2020)   PRAPARE - Hydrologist (Medical): No    Lack of Transportation (Non-Medical): No  Physical Activity: Insufficiently Active (12/06/2020)   Exercise  Vital Sign    Days of Exercise per Week: 3 days    Minutes of Exercise per Session: 30 min  Stress: No Stress Concern Present (12/06/2020)   [place]    Feeling of Stress : Only a little  Social Connections: Moderately Isolated (12/06/2020)   Social Connection and Isolation Panel [NHANES]    Frequency of Communication with Friends and Family: More than three times a week    Frequency of Social Gatherings with Friends and Family: More than three times a week    Attends Religious Services: More than 4 times per year    Active Member of [employer] or Organizations: No    Attends Archivist Meetings: Never  Marital Status: Widowed     Family History: The patient's family history includes Diabetes in his maternal aunt, maternal grandmother, mother, and sister; Heart attack in his maternal grandmother and mother. There is no history of Colon cancer, Colon polyps, Esophageal cancer, Rectal cancer, or Stomach cancer.  ROS:   Please see the history of present illness.    Review of Systems  Constitutional:  Negative for chills and fever.  HENT:  Negative for hearing loss and sore throat.   Eyes:  Negative for blurred vision and redness.  Respiratory:  Negative for shortness of breath.   Cardiovascular:  Negative for chest pain, palpitations, orthopnea, claudication, leg swelling and PND.  Gastrointestinal:  Positive for heartburn. Negative for nausea and vomiting.  Genitourinary:  Negative for dysuria and flank pain.  Musculoskeletal:  Positive for joint pain (R elbow). Negative for falls.  Neurological:  Negative for dizziness and loss of consciousness.  Endo/Heme/Allergies:  Negative for polydipsia.  Psychiatric/Behavioral:  Negative for substance abuse.     EKGs/Labs/Other Studies Reviewed:    The following studies were reviewed today: Myoview 11/22/20   The study is normal. The study is low risk.   No ST  deviation was noted.   LV perfusion is normal. There is no evidence of ischemia. There is no evidence of infarction.   Left ventricular function is normal. End diastolic cavity size is normal.   Prior study available for comparison from 04/05/2019. No changes compared to prior study.   Normal study without evidence of ischemia or infarction.  Normal LVEF, 59%. Normal exercise capacity (7:00 min:s; 7.7 METS). Normal HR/BP response to exercise.  This is low-risk study.   Echocardiogram 02/24/2020:  Left ventricle cavity is normal in size and wall thickness. Normal global  wall motion. Normal LV systolic function with EF 61%. Doppler evidence of  grade I (impaired) diastolic dysfunction, normal LAP.  Mild (Grade I) aortic regurgitation.  Normal right atrial pressure.  Compared to previous study on 02/23/2019, AI reduced from grade II to  grade I. MR is now absent.    Lexiscan (Walking with mod Bruce)Tetrofosmin Stress Test  04/05/2019: Nondiagnostic ECG stress. Perfusion images revealed diaphragmatic attenuation artifact in the inferior wall without evidence of ischemia or scar.  The left ventricle is mildly dilated.  There was no regional wall motion abnormality.  Mild global hypokinesis.  Calculated LVEF moderately reduced at 39%, visually EF appears to be 45%.  Low risk study.   Compared 12/08/2017, no significant change in perfusion, however there was significant EKG abnormalities noted associated with chest pain on the treadmill stress test.  Clinical correlation recommended. Low risk study.   Coronary angiography and intervention 08/04/2018: LM: Normal LAD: Prox-mid 75% stenosis. CTA FFR 0.75.         Diag 1 small vessel with 70% disease LCx: Normal RCA: Prox 60% stenosis. CTA FFR 0.85   Successful percutaneous coronary intervention prox-mid LAD PTCA and stent placement Resolute Onyx 3.5 X 22 mm drug-eluting stent Recommend medical management for small vessel diag disease, and RCA  nonobstructive disease.   EKG: EKG was not ordered today.  Recent Labs: 12/11/2021: BUN 12; Creatinine, Ser 1.17; Potassium 4.5; Sodium 138  Recent Lipid Panel    Component Value Date/Time   CHOL 119 12/11/2021 1100   TRIG 81 12/11/2021 1100   HDL 66 12/11/2021 1100   CHOLHDL 1.8 12/11/2021 1100   CHOLHDL 5.9 (H) 11/01/2014 1026   VLDL 23 11/01/2014 1026   LDLCALC 37 12/11/2021 1100  LDLDIRECT 84 09/03/2011 0930      Physical Exam:    VS:  BP 122/76   Pulse 70   Ht 5\' 11"  (1.803 m)   Wt 166 lb (75.3 kg)   SpO2 98%   BMI 23.15 kg/m     Wt Readings from Last 3 Encounters:  05/16/22 166 lb (75.3 kg)  12/11/21 165 lb 9.6 oz (75.1 kg)  11/22/21 165 lb 6.4 oz (75 kg)     GEN:  Well nourished, well developed in no acute distress HEENT: Normal NECK: No JVD; No carotid bruits CARDIAC: RRR, 1/6 soft systolic murmur. No rubs or gallops RESPIRATORY:  Clear to auscultation without rales, wheezing or rhonchi  ABDOMEN: Soft, non-tender, non-distended MUSCULOSKELETAL:  No edema; No deformity  SKIN: Warm and dry NEUROLOGIC:  Alert and oriented x 3 PSYCHIATRIC:  Normal affect   ASSESSMENT:    1. Coronary artery disease involving native coronary artery of native heart without angina pectoris   2. Essential hypertension   3. Type 2 diabetes mellitus without complication, without long-term current use of insulin ([place])   4. Nonrheumatic aortic valve insufficiency   5. Mild mitral regurgitation   6. Mixed hyperlipidemia   7. Precordial chest pain   8. S/P primary angioplasty with coronary stent     PLAN:    In order of problems listed above:  #Known CAD s/p prox-mid PCI of LAD: S/p DES with Resolute Onyx 3.5 X 22 mm (07/2018), 70% diag 1 and 60% prox RCA disease (dFR 0.85). Lexiscan in 03/2019 without ischemia. TTE in 01/2020 with LVEF 61%, G1 DD, mild AR. Doing well without anginal symptoms. Remains very active. -Continue ASA 81mg  daily -Continue Crestor 20mg   daily -Continue metop succinate 50mg  daily -No ACE/ARB due to hyperK (5.3)   #Noncardiac Chest Pain: Improved. Patient with chest discomfort after eating/drinking. No exertional symptoms and patient able to walk 2-3 miles without issues. Myoview negative for ischemia. TTE with normal EF, no WMA. -Continue nexium and pepcid   #HTN: Controlled and at goal. -Continue metop 50mg  daily -Continue amlodipine 10mg  daily -Low Na diet   #HLD: Well controlled. LDL well controlled 37. -Continue crestor 20mg  daily -LDL well controlled at 37 in 11/2021   #DMII: -Continue metformin   #Mild AR: Noted in 2021. -Repeat TTE for monitoring   Medication Adjustments/Labs and Tests Ordered: Current medicines are reviewed at length with the patient today.  Concerns regarding medicines are outlined above.  Orders Placed This Encounter  Procedures   ECHOCARDIOGRAM COMPLETE   No orders of the defined types were placed in this encounter.   Patient Instructions  Medication Instructions:   Your physician recommends that you continue on your current medications as directed. Please refer to the Current Medication list given to you today.  *If you need a refill on your cardiac medications before your next appointment, please call your pharmacy*   Testing/Procedures:  Your physician has requested that you have an echocardiogram. Echocardiography is a painless test that uses sound waves to create images of your heart. It provides your doctor with information about the size and shape of your heart and how well your heart's chambers and valves are working. This procedure takes approximately one hour. There are no restrictions for this procedure. Please do NOT wear cologne, perfume, aftershave, or lotions (deodorant is allowed). Please arrive 15 minutes prior to your appointment time.    Follow-Up: At [facility], you and your health needs are our priority.  As part  of our continuing mission  to provide you with exceptional heart care, we have created designated Provider Care Teams.  These Care Teams include your primary Cardiologist (physician) and Advanced Practice Providers (APPs -  Physician Assistants and Nurse Practitioners) who all work together to provide you with the care you need, when you need it.  We recommend signing up for the patient portal called "MyChart".  Sign up information is provided on this After Visit Summary.  MyChart is used to connect with patients for Virtual Visits (Telemedicine).  Patients are able to view lab/test results, encounter notes, upcoming appointments, etc.  Non-urgent messages can be sent to your provider as well.   To learn more about what you can do with MyChart, go to [email or web address].    Your next appointment:   7 month(s)  Provider:   Freada Bergeron, MD          Signed, Freada Bergeron, MD  05/16/2022 11:18 AM    [place]

## 2022-05-16 ENCOUNTER — Encounter: Payer: Self-pay | Admitting: Cardiology

## 2022-05-16 ENCOUNTER — Ambulatory Visit: Payer: 59 | Attending: Cardiology | Admitting: Cardiology

## 2022-05-16 VITALS — BP 122/76 | HR 70 | Ht 71.0 in | Wt 166.0 lb

## 2022-05-16 DIAGNOSIS — I10 Essential (primary) hypertension: Secondary | ICD-10-CM | POA: Diagnosis not present

## 2022-05-16 DIAGNOSIS — I2510 Atherosclerotic heart disease of native coronary artery without angina pectoris: Secondary | ICD-10-CM

## 2022-05-16 DIAGNOSIS — I340 Nonrheumatic mitral (valve) insufficiency: Secondary | ICD-10-CM | POA: Diagnosis not present

## 2022-05-16 DIAGNOSIS — R072 Precordial pain: Secondary | ICD-10-CM

## 2022-05-16 DIAGNOSIS — Z955 Presence of coronary angioplasty implant and graft: Secondary | ICD-10-CM

## 2022-05-16 DIAGNOSIS — E782 Mixed hyperlipidemia: Secondary | ICD-10-CM

## 2022-05-16 DIAGNOSIS — E119 Type 2 diabetes mellitus without complications: Secondary | ICD-10-CM | POA: Diagnosis not present

## 2022-05-16 DIAGNOSIS — I351 Nonrheumatic aortic (valve) insufficiency: Secondary | ICD-10-CM

## 2022-05-16 NOTE — Patient Instructions (Signed)
Medication Instructions:   Your physician recommends that you continue on your current medications as directed. Please refer to the Current Medication list given to you today.  *If you need a refill on your cardiac medications before your next appointment, please call your pharmacy*   Testing/Procedures:  Your physician has requested that you have an echocardiogram. Echocardiography is a painless test that uses sound waves to create images of your heart. It provides your doctor with information about the size and shape of your heart and how well your heart's chambers and valves are working. This procedure takes approximately one hour. There are no restrictions for this procedure. Please do NOT wear cologne, perfume, aftershave, or lotions (deodorant is allowed). Please arrive 15 minutes prior to your appointment time.    Follow-Up: At [facility], you and your health needs are our priority.  As part of our continuing mission to provide you with exceptional heart care, we have created designated Provider Care Teams.  These Care Teams include your primary Cardiologist (physician) and Advanced Practice Providers (APPs -  Physician Assistants and Nurse Practitioners) who all work together to provide you with the care you need, when you need it.  We recommend signing up for the patient portal called "MyChart".  Sign up information is provided on this After Visit Summary.  MyChart is used to connect with patients for Virtual Visits (Telemedicine).  Patients are able to view lab/test results, encounter notes, upcoming appointments, etc.  Non-urgent messages can be sent to your provider as well.   To learn more about what you can do with MyChart, go to [email or web address].    Your next appointment:   7 month(s)  Provider:   Freada Bergeron, MD

## 2022-06-18 ENCOUNTER — Ambulatory Visit (HOSPITAL_COMMUNITY): Payer: 59 | Attending: Cardiology

## 2022-06-18 DIAGNOSIS — I340 Nonrheumatic mitral (valve) insufficiency: Secondary | ICD-10-CM | POA: Diagnosis not present

## 2022-06-18 DIAGNOSIS — I351 Nonrheumatic aortic (valve) insufficiency: Secondary | ICD-10-CM | POA: Diagnosis not present

## 2022-06-18 LAB — ECHOCARDIOGRAM COMPLETE
Area-P 1/2: 3.05 cm2
P 1/2 time: 635 msec
S' Lateral: 3.5 cm

## 2022-07-09 ENCOUNTER — Other Ambulatory Visit: Payer: Self-pay | Admitting: *Deleted

## 2022-07-09 DIAGNOSIS — I10 Essential (primary) hypertension: Secondary | ICD-10-CM

## 2022-07-09 MED ORDER — METOPROLOL SUCCINATE ER 50 MG PO TB24
50.0000 mg | ORAL_TABLET | Freq: Every day | ORAL | 3 refills | Status: DC
Start: 2022-07-09 — End: 2023-03-21

## 2022-07-16 DIAGNOSIS — H2511 Age-related nuclear cataract, right eye: Secondary | ICD-10-CM | POA: Diagnosis not present

## 2022-07-16 DIAGNOSIS — Z961 Presence of intraocular lens: Secondary | ICD-10-CM | POA: Diagnosis not present

## 2022-07-16 DIAGNOSIS — H524 Presbyopia: Secondary | ICD-10-CM | POA: Diagnosis not present

## 2022-07-16 DIAGNOSIS — E119 Type 2 diabetes mellitus without complications: Secondary | ICD-10-CM | POA: Diagnosis not present

## 2022-07-16 DIAGNOSIS — H25011 Cortical age-related cataract, right eye: Secondary | ICD-10-CM | POA: Diagnosis not present

## 2022-07-16 DIAGNOSIS — H5203 Hypermetropia, bilateral: Secondary | ICD-10-CM | POA: Diagnosis not present

## 2022-08-05 ENCOUNTER — Other Ambulatory Visit: Payer: Self-pay | Admitting: *Deleted

## 2022-08-05 DIAGNOSIS — I2510 Atherosclerotic heart disease of native coronary artery without angina pectoris: Secondary | ICD-10-CM

## 2022-08-05 MED ORDER — ROSUVASTATIN CALCIUM 20 MG PO TABS
20.0000 mg | ORAL_TABLET | Freq: Every day | ORAL | 3 refills | Status: DC
Start: 2022-08-05 — End: 2023-04-15

## 2022-08-16 LAB — HM DIABETES EYE EXAM

## 2022-08-23 ENCOUNTER — Encounter (HOSPITAL_COMMUNITY): Payer: Self-pay | Admitting: *Deleted

## 2022-08-23 ENCOUNTER — Other Ambulatory Visit: Payer: Self-pay

## 2022-08-23 ENCOUNTER — Emergency Department (HOSPITAL_COMMUNITY): Payer: Medicare Other

## 2022-08-23 ENCOUNTER — Emergency Department (HOSPITAL_COMMUNITY)
Admission: EM | Admit: 2022-08-23 | Discharge: 2022-08-23 | Disposition: A | Discharge status: Home / Self Care | Payer: Medicare Other | Attending: Emergency Medicine | Admitting: Emergency Medicine

## 2022-08-23 DIAGNOSIS — R1011 Right upper quadrant pain: Secondary | ICD-10-CM | POA: Insufficient documentation

## 2022-08-23 DIAGNOSIS — I10 Essential (primary) hypertension: Secondary | ICD-10-CM | POA: Insufficient documentation

## 2022-08-23 DIAGNOSIS — R1013 Epigastric pain: Secondary | ICD-10-CM | POA: Diagnosis not present

## 2022-08-23 DIAGNOSIS — E119 Type 2 diabetes mellitus without complications: Secondary | ICD-10-CM | POA: Diagnosis not present

## 2022-08-23 DIAGNOSIS — Z7984 Long term (current) use of oral hypoglycemic drugs: Secondary | ICD-10-CM | POA: Diagnosis not present

## 2022-08-23 DIAGNOSIS — Z79899 Other long term (current) drug therapy: Secondary | ICD-10-CM | POA: Insufficient documentation

## 2022-08-23 DIAGNOSIS — R001 Bradycardia, unspecified: Secondary | ICD-10-CM | POA: Diagnosis not present

## 2022-08-23 DIAGNOSIS — I2510 Atherosclerotic heart disease of native coronary artery without angina pectoris: Secondary | ICD-10-CM | POA: Insufficient documentation

## 2022-08-23 DIAGNOSIS — R1012 Left upper quadrant pain: Secondary | ICD-10-CM | POA: Insufficient documentation

## 2022-08-23 DIAGNOSIS — Z7982 Long term (current) use of aspirin: Secondary | ICD-10-CM | POA: Insufficient documentation

## 2022-08-23 DIAGNOSIS — N281 Cyst of kidney, acquired: Secondary | ICD-10-CM | POA: Diagnosis not present

## 2022-08-23 DIAGNOSIS — R1010 Upper abdominal pain, unspecified: Secondary | ICD-10-CM

## 2022-08-23 LAB — COMPREHENSIVE METABOLIC PANEL
ALT: 19 U/L (ref 0–44)
AST: 19 U/L (ref 15–41)
Albumin: 4 g/dL (ref 3.5–5.0)
Alkaline Phosphatase: 53 U/L (ref 38–126)
Anion gap: 7 (ref 5–15)
BUN: 10 mg/dL (ref 8–23)
CO2: 25 mmol/L (ref 22–32)
Calcium: 8.9 mg/dL (ref 8.9–10.3)
Chloride: 105 mmol/L (ref 98–111)
Creatinine, Ser: 1.32 mg/dL — ABNORMAL HIGH (ref 0.61–1.24)
GFR, Estimated: 57 mL/min — ABNORMAL LOW (ref >60)
Glucose, Bld: 132 mg/dL — ABNORMAL HIGH (ref 70–99)
Potassium: 4.1 mmol/L (ref 3.5–5.1)
Sodium: 137 mmol/L (ref 135–145)
Total Bilirubin: 0.7 mg/dL (ref 0.3–1.2)
Total Protein: 7.2 g/dL (ref 6.5–8.1)

## 2022-08-23 LAB — URINALYSIS, ROUTINE W REFLEX MICROSCOPIC
Bilirubin Urine: NEGATIVE
Glucose, UA: NEGATIVE mg/dL
Hgb urine dipstick: NEGATIVE
Ketones, ur: NEGATIVE mg/dL
Leukocytes,Ua: NEGATIVE
Nitrite: NEGATIVE
Protein, ur: NEGATIVE mg/dL
Specific Gravity, Urine: 1.016 (ref 1.005–1.030)
pH: 7 (ref 5.0–8.0)

## 2022-08-23 LAB — CBC
HCT: 35.9 % — ABNORMAL LOW (ref 39.0–52.0)
Hemoglobin: 12.1 g/dL — ABNORMAL LOW (ref 13.0–17.0)
MCH: 31.3 pg (ref 26.0–34.0)
MCHC: 33.7 g/dL (ref 30.0–36.0)
MCV: 93 fL (ref 80.0–100.0)
Platelets: 323 10*3/uL (ref 150–400)
RBC: 3.86 MIL/uL — ABNORMAL LOW (ref 4.22–5.81)
RDW: 13.2 % (ref 11.5–15.5)
WBC: 6.5 10*3/uL (ref 4.0–10.5)
nRBC: 0 % (ref 0.0–0.2)

## 2022-08-23 LAB — TROPONIN I (HIGH SENSITIVITY): Troponin I (High Sensitivity): <2 ng/L (ref <18)

## 2022-08-23 LAB — LIPASE, BLOOD: Lipase: 39 U/L (ref 11–51)

## 2022-08-23 MED ORDER — IOHEXOL 350 MG/ML SOLN
75.0000 mL | Freq: Once | INTRAVENOUS | Status: AC | PRN
  Administered 2022-08-23: 75 mL via INTRAVENOUS

## 2022-08-23 MED ORDER — SUCRALFATE 1 G PO TABS
1.0000 g | ORAL_TABLET | Freq: Once | ORAL | Status: AC
  Administered 2022-08-23: 1 g via ORAL
  Filled 2022-08-23: qty 1

## 2022-08-23 MED ORDER — ALUM & MAG HYDROXIDE-SIMETH 200-200-20 MG/5ML PO SUSP
30.0000 mL | Freq: Once | ORAL | Status: AC
  Administered 2022-08-23: 30 mL via ORAL
  Filled 2022-08-23: qty 30

## 2022-08-23 MED ORDER — SODIUM CHLORIDE 0.9 % IV BOLUS
1000.0000 mL | Freq: Once | INTRAVENOUS | Status: AC
  Administered 2022-08-23: 1000 mL via INTRAVENOUS

## 2022-08-23 MED ORDER — MORPHINE SULFATE (PF) 4 MG/ML IV SOLN
4.0000 mg | Freq: Once | INTRAVENOUS | Status: AC
  Administered 2022-08-23: 4 mg via INTRAVENOUS
  Filled 2022-08-23: qty 1

## 2022-08-23 MED ORDER — SUCRALFATE 1 G PO TABS: 1.0000 g | ORAL_TABLET | Freq: Three times a day (TID) | ORAL | 0 refills | Status: DC

## 2022-08-23 NOTE — ED Provider Notes (Signed)
[place] EMERGENCY DEPARTMENT AT [facility] Provider Note   CSN: [id number] Arrival date & time: 08/23/22  1338     History  Chief Complaint  Patient presents with   Abdominal Pain    Adam Burton is a 72 y.o. male with a past medical history significant for hyperlipidemia, hypertension, CAD, type 2 diabetes, alcohol abuse, and polysubstance abuse who presents to the ED due to bilateral upper abdominal pain that started last night after eating soup.  Patient states pain is located in the right and left upper quadrants and radiates to the middle of his back.  Denies associated chest pain and shortness of breath.  Denies nausea, vomiting, and diarrhea.  Last bowel movement was earlier today which was normal.  No fever or chills.  Patient states pain improved however, recurred after eating more (same) soup earlier today.  Previous hernia repair however, no other abdominal operations.  No history of blood clots.  Denies lower extremity edema. Denies urinary symptoms.   History obtained from patient and past medical records. No interpreter used during encounter.       Home Medications Prior to Admission medications   Medication Sig Start Date End Date Taking? Authorizing Provider  sucralfate (CARAFATE) 1 g tablet Take 1 tablet (1 g total) by mouth 3 (three) times daily. 08/23/22  Yes Nedda Gains, Merla Riches, PA-C  acetaminophen (TYLENOL) 500 MG tablet Take 2 tablets (1,000 mg total) by mouth every 8 (eight) hours as needed for moderate pain. 10/07/18   Sandre Kitty, MD  amLODipine (NORVASC) 10 MG tablet Take 1 tablet (10 mg total) by mouth daily. 04/17/22   Meriam Sprague, MD  aspirin EC 81 MG tablet Take 1 tablet (81 mg total) by mouth daily. 03/03/20   Patwardhan, Anabel Bene, MD  esomeprazole (NEXIUM) 40 MG capsule Take 1 capsule (40 mg total) by mouth daily at 12 noon. 12/24/21   Meriam Sprague, MD  famotidine (PEPCID) 20 MG tablet Take 1 tablet (20 mg total) by mouth 2  (two) times daily. 12/11/21   Latrelle Dodrill, MD  metFORMIN (GLUCOPHAGE-XR) 500 MG 24 hr tablet TAKE 1 TABLET BY MOUTH DAILY  WITH BREAKFAST 03/18/22   Latrelle Dodrill, MD  metoprolol succinate (TOPROL-XL) 50 MG 24 hr tablet Take 1 tablet (50 mg total) by mouth daily. Take with or immediately following a meal. 07/09/22   Pemberton, Kathlynn Grate, MD  nitroGLYCERIN (NITROSTAT) 0.4 MG SL tablet Place 1 tablet (0.4 mg total) under the tongue every 5 (five) minutes as needed for chest pain. 03/03/20   Patwardhan, Anabel Bene, MD  polyethylene glycol powder (MIRALAX) 17 GM/SCOOP powder Take 17 g by mouth daily as needed (constipation). 12/11/21   Latrelle Dodrill, MD  rosuvastatin (CRESTOR) 20 MG tablet Take 1 tablet (20 mg total) by mouth daily. 08/05/22   Meriam Sprague, MD  tamsulosin (FLOMAX) 0.4 MG CAPS capsule Take 2 capsules (0.8 mg total) by mouth daily. 06/05/21   Latrelle Dodrill, MD      Allergies    Patient has no known allergies.    Review of Systems   Review of Systems  Constitutional:  Negative for fever.  Respiratory:  Negative for shortness of breath.   Cardiovascular:  Negative for chest pain.  Gastrointestinal:  Positive for abdominal pain. Negative for diarrhea, nausea and vomiting.  Genitourinary:  Negative for dysuria.  Musculoskeletal:  Positive for back pain.    Physical Exam Updated Vital Signs BP (!) 142/83 (  BP Location: Right Arm)   Pulse 70   Temp 98.4 F (36.9 C) (Oral)   Resp 16   Ht 5\' 11"  (1.803 m)   Wt 76.2 kg   SpO2 98%   BMI 23.43 kg/m  Physical Exam Vitals and nursing note reviewed.  Constitutional:      General: He is not in acute distress.    Appearance: He is not ill-appearing.  HENT:     Head: Normocephalic.  Eyes:     Pupils: Pupils are equal, round, and reactive to light.  Cardiovascular:     Rate and Rhythm: Normal rate and regular rhythm.     Pulses: Normal pulses.     Heart sounds: Normal heart sounds. No murmur  heard.    No friction rub. No gallop.  Pulmonary:     Effort: Pulmonary effort is normal.     Breath sounds: Normal breath sounds.  Abdominal:     General: Abdomen is flat. There is no distension.     Palpations: Abdomen is soft.     Tenderness: There is abdominal tenderness. There is no guarding or rebound.     Comments: Very mild RUQ and LUQ tenderness. No rebound or guarding  Musculoskeletal:        General: Normal range of motion.     Cervical back: Neck supple.  Skin:    General: Skin is warm and dry.  Neurological:     General: No focal deficit present.     Mental Status: He is alert.  Psychiatric:        Mood and Affect: Mood normal.        Behavior: Behavior normal.     ED Results / Procedures / Treatments   Labs (all labs ordered are listed, but only abnormal results are displayed) Labs Reviewed  COMPREHENSIVE METABOLIC PANEL - Abnormal; Notable for the following components:      Result Value   Glucose, Bld 132 (*)    Creatinine, Ser 1.32 (*)    GFR, Estimated 57 (*)    All other components within normal limits  CBC - Abnormal; Notable for the following components:   RBC 3.86 (*)    Hemoglobin 12.1 (*)    HCT 35.9 (*)    All other components within normal limits  LIPASE, BLOOD  URINALYSIS, ROUTINE W REFLEX MICROSCOPIC  TROPONIN I (HIGH SENSITIVITY)    EKG EKG Interpretation Date/Time:  Friday August 23 2022 15:19:52 EDT Ventricular Rate:  57 PR Interval:  186 QRS Duration:  74 QT Interval:  404 QTC Calculation: 393 R Axis:   11  Text Interpretation: Sinus bradycardia T wave abnormality, consider inferolateral ischemia Abnormal ECG When compared with ECG of 01-Apr-2019 12:23, PREVIOUS ECG IS PRESENT Improved from previous Confirmed by Vanetta Mulders [phone]) on 08/23/2022 5:16:42 PM  Radiology CT ABDOMEN PELVIS W CONTRAST  Result Date: 08/23/2022 CLINICAL DATA:  Epigastric pain EXAM: CT ABDOMEN AND PELVIS WITH CONTRAST TECHNIQUE: Multidetector CT  imaging of the abdomen and pelvis was performed using the standard protocol following bolus administration of intravenous contrast. RADIATION DOSE REDUCTION: This exam was performed according to the departmental dose-optimization program which includes automated exposure control, adjustment of the mA and/or kV according to patient size and/or use of iterative reconstruction technique. CONTRAST:  75mL OMNIPAQUE IOHEXOL 350 MG/ML SOLN COMPARISON:  None Available. FINDINGS: Lower chest: There is some linear opacity lung bases likely scar or atelectasis. No pleural effusion. Breathing motion lung bases. Coronary artery calcifications are seen. Hepatobiliary: There  are some small low-attenuation lesions in the left hepatic lobe, too small to completely characterize but likely benign cysts. No specific imaging follow-up. Patent portal vein. Gallbladder is nondilated. Pancreas: Slight ectasia of the pancreatic duct in the head and neck region, nonspecific. No pancreatic atrophy. No enhancing mass. Spleen: Normal in size without focal abnormality. Adrenals/Urinary Tract: The adrenal glands are preserved. No enhancing renal mass or collecting system dilatation. The ureters have normal course and caliber extending down to the bladder. Bosniak 1 bilateral renal cysts are identified. This includes right-sided focus measuring 3.4 cm in diameter with Hounsfield units of 8. Left-sided upper pole focus has Hounsfield unit of 7 in diameter of 20 mm. Stomach/Bowel: No oral contrast. There is some wall thickening of the stomach towards the greater curve measuring up to 2.2 cm. Please correlate for fold thickening and symptomatology. The small bowel is nondilated. Large bowel is of normal course and caliber with scattered stool. Normal appendix. Vascular/Lymphatic: Aortic atherosclerosis. No enlarged abdominal or pelvic lymph nodes. Reproductive: Enlarged prostate. Other: No free air or free fluid. Musculoskeletal: Scattered  degenerative changes along the spine. IMPRESSION: Asymmetric wall thickening along the greater curve of the stomach. Please correlate for gastritis or other process. Further workup as and when clinically appropriate. No bowel obstruction, free air or free fluid. Normal appendix. Prominent stool. Electronically Signed   By: Karen Kays M.D.   On: 08/23/2022 16:20    Procedures Procedures    Medications Ordered in ED Medications  sodium chloride 0.9 % bolus 1,000 mL (0 mLs Intravenous Stopped 08/23/22 1624)  morphine (PF) 4 MG/ML injection 4 mg (4 mg Intravenous Given 08/23/22 1552)  iohexol (OMNIPAQUE) 350 MG/ML injection 75 mL (75 mLs Intravenous Contrast Given 08/23/22 1612)  alum & mag hydroxide-simeth (MAALOX/MYLANTA) 200-200-20 MG/5ML suspension 30 mL (30 mLs Oral Given 08/23/22 1655)  sucralfate (CARAFATE) tablet 1 g (1 g Oral Given 08/23/22 1655)    ED Course/ Medical Decision Making/ A&P Clinical Course as of 08/23/22 1726  Fri Aug 23, 2022  1503 Hemoglobin(!): 12.1 [CA]    Clinical Course User Index [CA] Mannie Stabile, PA-C                             Medical Decision Making Amount and/or Complexity of Data Reviewed External Data Reviewed: notes.    Details: Previous cardiology notes Labs: ordered. Decision-making details documented in ED Course. Radiology: ordered and independent interpretation performed. Decision-making details documented in ED Course. ECG/medicine tests: ordered and independent interpretation performed. Decision-making details documented in ED Course.  Risk OTC drugs. Prescription drug management.   This patient presents to the ED for concern of upper abdominal pain, this involves an extensive number of treatment options, and is a complaint that carries with it a high risk of complications and morbidity.  The differential diagnosis includes pancreatitis, diverticulitis, bowel obstruction, gastritis, ACS, etc  72 year old male presents to the ED  due to upper abdominal pain that radiates to his back after eating soup last night.  No nausea, vomiting, or diarrhea.  Denies chest pain and shortness of breath.  No history of blood clots.  Last bowel movement earlier today which was normal.  Previous hernia repair however, no other abdominal operations.  No fever or chills.  No urinary symptoms.  Upon arrival patient afebrile, not tachycardic or hypoxic.  Patient in no acute distress.  Patient well-appearing on exam.  Very mild right upper and left upper  quadrant tenderness without rebound or guarding. Negative murphy's sign. Abdominal labs ordered in triage. Added troponin and EKG to rule cardiac etiology given significant cardiac history (elevated calcium score). CT abdomen ordered. IVFs and morphine given.   CBC reassuring.  No leukocytosis.  Mild anemia with hemoglobin 12.1.  Lipase normal.  CMP significant for elevated creatinine 1.32.  IV fluids given.  UA negative for signs of infection.  Troponin normal.  EKG demonstrates sinus bradycardia with nonspecific T wave abnormalities.  Improvement from previous EKG.  Low suspicion for ACS.  CT abdomen personally reviewed and interpreted which demonstrates possible gastritis. Gi cocktail and Carafate given.  No evidence of bowel obstruction.  Does demonstrate prominent stool.  Discussed bowel regimen with patient.   5:19 PM reassessed patient at bedside.  Patient admits to improvement in pain after GI cocktail and Carafate.  Suspect symptoms related to gastritis vs. constipation given CT findings.  Troponin and EKG were reassuring.  Low suspicion for ACS.  Presentation nonconcerning for PE.  Low suspicion for aortic dissection.  Patient discharged with symptomatic treatment and advised to follow-up with PCP for further evaluation. Strict ED precautions discussed with patient. Patient states understanding and agrees to plan. Patient discharged home in no acute distress and stable vitals.  Discussed with  Dr. Deretha Emory who agrees with assessment and plan.   Has PCP Lives at home Hx HTN, CAD, DM       Final Clinical Impression(s) / ED Diagnoses Final diagnoses:  Pain of upper abdomen    Rx / DC Orders ED Discharge Orders          Ordered    sucralfate (CARAFATE) 1 g tablet  3 times daily        08/23/22 1725              Mannie Stabile, PA-C 08/23/22 1726    Vanetta Mulders, MD 08/24/22 1407

## 2022-08-23 NOTE — ED Triage Notes (Signed)
C/o abd. Pain onset last pm after eating soup , drank water this am and  ginger tea and is still hurting c/o pain radiating into his back. Denies n/v/d

## 2022-08-23 NOTE — ED Notes (Signed)
Patient transported to CT 

## 2022-08-23 NOTE — Discharge Instructions (Signed)
It was a pleasure taking care of you today.  As discussed, your CT scan showed possible gastritis which is inflammation of the lining of the stomach.  Also showed some constipation.  You may take over-the-counter stool softeners and MiraLAX.  I am sending you home with Carafate.  Use as needed for upper abdominal pain.  Please follow-up with PCP within the next 2 to 3 days for further evaluation.  Return to the ER for new or worsening symptoms.

## 2022-09-03 ENCOUNTER — Ambulatory Visit: Payer: 59 | Admitting: Family Medicine

## 2022-09-03 ENCOUNTER — Other Ambulatory Visit: Payer: Self-pay | Admitting: *Deleted

## 2022-09-03 MED ORDER — ESOMEPRAZOLE MAGNESIUM 40 MG PO CPDR: 40.0000 mg | DELAYED_RELEASE_CAPSULE | Freq: Every day | ORAL | 2 refills | Status: DC

## 2022-09-03 NOTE — Progress Notes (Deleted)
    SUBJECTIVE:   CHIEF COMPLAINT / HPI:   ***  PERTINENT  PMH / PSH: ***  OBJECTIVE:   There were no vitals taken for this visit.  ***  Physical Exam ***  ASSESSMENT/PLAN:   No problem-specific Assessment & Plan notes found for this encounter.    There are no Patient Instructions on file for this visit.  Lorayne Bender, MD [facility]

## 2022-09-24 ENCOUNTER — Encounter: Payer: Self-pay | Admitting: Family Medicine

## 2022-09-24 ENCOUNTER — Other Ambulatory Visit: Payer: Self-pay

## 2022-09-24 ENCOUNTER — Ambulatory Visit (INDEPENDENT_AMBULATORY_CARE_PROVIDER_SITE_OTHER): Payer: Medicare Other | Admitting: Family Medicine

## 2022-09-24 VITALS — BP 116/67 | HR 60 | Ht 71.0 in | Wt 163.2 lb

## 2022-09-24 DIAGNOSIS — E119 Type 2 diabetes mellitus without complications: Secondary | ICD-10-CM

## 2022-09-24 DIAGNOSIS — E782 Mixed hyperlipidemia: Secondary | ICD-10-CM | POA: Diagnosis not present

## 2022-09-24 DIAGNOSIS — I2510 Atherosclerotic heart disease of native coronary artery without angina pectoris: Secondary | ICD-10-CM | POA: Diagnosis not present

## 2022-09-24 DIAGNOSIS — I10 Essential (primary) hypertension: Secondary | ICD-10-CM | POA: Diagnosis not present

## 2022-09-24 DIAGNOSIS — K219 Gastro-esophageal reflux disease without esophagitis: Secondary | ICD-10-CM

## 2022-09-24 DIAGNOSIS — R351 Nocturia: Secondary | ICD-10-CM

## 2022-09-24 DIAGNOSIS — N401 Enlarged prostate with lower urinary tract symptoms: Secondary | ICD-10-CM

## 2022-09-24 LAB — POCT GLYCOSYLATED HEMOGLOBIN (HGB A1C): Hemoglobin A1C: 5.9 % — AB (ref 4.0–5.6)

## 2022-09-24 NOTE — Patient Instructions (Signed)
It was great to see you again today.  Follow up in November/December, will check labs then Continue current medications Someone will call you about scheduling Annual Wellness Visit   Be well, Dr. Pollie Meyer

## 2022-09-24 NOTE — Progress Notes (Unsigned)
  Date of Visit: 09/24/2022   SUBJECTIVE:   HPI:  Adam Burton presents today for routine follow-up.  Diabetes: Currently taking metformin XR 500 mg daily.  Tolerating this well.  Hypertension: Currently taking amlodipine 10 mg daily, metoprolol succinate 50 mg daily.  Hyperlipidemia: Currently taking rosuvastatin 20 mg daily.  Tolerating this well.  GERD: Currently taking Nexium 40 mg daily and famotidine 20 mg twice a day.  Tolerating these well with good control of symptoms.  CAD: Taking aspirin 81 mg daily.  Has nitroglycerin on hand if needed.  BPH: Currently taking Flomax 0.8 mg daily.  Symptoms well-controlled with this medication.   OBJECTIVE:   BP 116/67   Pulse 60   Ht 5\' 11"  (1.803 m)   Wt 163 lb 3.2 oz (74 kg)   SpO2 100%   BMI 22.76 kg/m  Gen: No acute distress, pleasant, cooperative HEENT: Normocephalic, atraumatic Heart: Regular rate and rhythm, no murmur Lungs: Clear to auscultation bilaterally, normal effort Neuro: Grossly nonfocal, speech normal Ext:  2+ DP pulses bilaterally and PT pulses bilaterally.  Firm nodule medially on the left foot on plantar surface, possible cyst, patient reports saw podiatry for this years ago and had medicine injected into it.  No changes since then, does not particularly bother him.  Monofilament testing sensation intact bilaterally.  No skin breakdown.  ASSESSMENT/PLAN:   Health maintenance:  -declines Tdap and second shingrix dose -foot exam done today, normal sensation -msg sent to schedule for Annual Wellness Visit   Type 2 diabetes mellitus (HCC) Well controlled. Continue current medication regimen.   Coronary artery disease Continue statin, aspirin, beta blocker.  Gastroesophageal reflux disease Well controlled. Continue current medication regimen.   Essential hypertension, benign Well controlled. Continue current medication regimen.   Benign prostatic hyperplasia Well controlled. Continue current medication  regimen.   Mixed hyperlipidemia Doing well on statin. Not due for lipids until November. Follow up then.  Patient mentioned having floaters in vision for years, has seen ophtho since they began, advised follow up with optho.  FOLLOW UP: Follow up in November for visit with labs Schedule Annual Wellness Visit  [place] J. Pollie Meyer, MD [facility]

## 2022-09-25 NOTE — Assessment & Plan Note (Signed)
Doing well on statin. Not due for lipids until November. Follow up then.

## 2022-09-25 NOTE — Assessment & Plan Note (Signed)
Well controlled. Continue current medication regimen.  

## 2022-09-25 NOTE — Assessment & Plan Note (Signed)
Continue statin, aspirin, beta-blocker

## 2022-09-30 ENCOUNTER — Other Ambulatory Visit: Payer: Self-pay

## 2022-09-30 DIAGNOSIS — I10 Essential (primary) hypertension: Secondary | ICD-10-CM

## 2022-09-30 MED ORDER — AMLODIPINE BESYLATE 10 MG PO TABS
10.0000 mg | ORAL_TABLET | Freq: Every day | ORAL | 2 refills | Status: DC
Start: 2022-09-30 — End: 2023-03-10

## 2022-10-07 NOTE — Progress Notes (Deleted)
Subjective:   Adam Burton is a 72 y.o. male who presents for Medicare Annual/Subsequent preventive examination.  Visit Complete: {VISITMETHOD:[phone]}  Patient Medicare AWV questionnaire was completed by the patient on ***; I have confirmed that all information answered by patient is correct and no changes since this date.  Review of Systems    ***       Objective:    There were no vitals filed for this visit. There is no height or weight on file to calculate BMI.     08/23/2022    1:53 PM 12/11/2021   11:06 AM 06/05/2021    9:35 AM 12/06/2020    3:11 PM 12/14/2019   11:03 AM 08/05/2019    9:54 AM 04/01/2019   12:31 PM  Advanced Directives  Does Patient Have a Medical Advance Directive? No No No No No No No  Would patient like information on creating a medical advance directive?   No - Patient declined Yes (MAU/Ambulatory/Procedural Areas - Information given) No - Patient declined No - Patient declined No - Patient declined    Current Medications (verified) Outpatient Encounter Medications as of 10/07/2022  Medication Sig   acetaminophen (TYLENOL) 500 MG tablet Take 2 tablets (1,000 mg total) by mouth every 8 (eight) hours as needed for moderate pain.   amLODipine (NORVASC) 10 MG tablet Take 1 tablet (10 mg total) by mouth daily.   aspirin EC 81 MG tablet Take 1 tablet (81 mg total) by mouth daily.   esomeprazole (NEXIUM) 40 MG capsule Take 1 capsule (40 mg total) by mouth daily at 12 noon.   famotidine (PEPCID) 20 MG tablet Take 1 tablet (20 mg total) by mouth 2 (two) times daily.   metFORMIN (GLUCOPHAGE-XR) 500 MG 24 hr tablet TAKE 1 TABLET BY MOUTH DAILY  WITH BREAKFAST   metoprolol succinate (TOPROL-XL) 50 MG 24 hr tablet Take 1 tablet (50 mg total) by mouth daily. Take with or immediately following a meal.   nitroGLYCERIN (NITROSTAT) 0.4 MG SL tablet Place 1 tablet (0.4 mg total) under the tongue every 5 (five) minutes as needed for chest pain.   polyethylene glycol  powder (MIRALAX) 17 GM/SCOOP powder Take 17 g by mouth daily as needed (constipation).   rosuvastatin (CRESTOR) 20 MG tablet Take 1 tablet (20 mg total) by mouth daily.   tamsulosin (FLOMAX) 0.4 MG CAPS capsule Take 2 capsules (0.8 mg total) by mouth daily.   No facility-administered encounter medications on file as of 10/07/2022.    Allergies (verified) Patient has no known allergies.   History: Past Medical History:  Diagnosis Date   CAD (coronary artery disease)    S/p DES to prox to mid LAD in 07/2018 // Myoview 9/22: No ischemia or infarction, EF 59, low risk   Diabetes mellitus without complication (HCC)    Hypercholesteremia    Hypertension    Renal insufficiency 08/21/2012   Past Surgical History:  Procedure Laterality Date   CARDIAC CATHETERIZATION  01/2009   Dr. [place]. Normal coronary arteries. Normal LV function.   CORONARY ANGIOGRAPHY N/A 08/04/2018   Procedure: CORONARY ANGIOGRAPHY (CATH LAB);  Surgeon: Elder Negus, MD;  Location: [facility] INVASIVE CV LAB;  Service: Cardiovascular;  Laterality: N/A;   CORONARY STENT INTERVENTION N/A 08/04/2018   Procedure: CORONARY STENT INTERVENTION;  Surgeon: Elder Negus, MD;  Location: [facility] INVASIVE CV LAB;  Service: Cardiovascular;  Laterality: N/A;   HERNIA REPAIR     NM MYOVIEW LTD  12/2008   Potential small area  of mild ischemia in the basal inferoseptal wall. Otherwise normal. --> False positive test based on coronary angiography   Stress Echocardiogram  12/2013   Exercise for just over 4 minutes. Heart rate increased to 148 BPM. Hypertensive response to exercise. EF 60%. No evidence of ischemia. NORMAL STUDY   TRANSTHORACIC ECHOCARDIOGRAM  11/2013   EF 55-60%. No regional wall motion abnormality is. Normal thickness. Mild LA dilation.   Family History  Problem Relation Age of Onset   Heart attack Mother    Diabetes Mother    Diabetes Sister    Diabetes Maternal Grandmother    Heart attack Maternal Grandmother     Diabetes Maternal Aunt    Colon cancer Neg Hx    Colon polyps Neg Hx    Esophageal cancer Neg Hx    Rectal cancer Neg Hx    Stomach cancer Neg Hx    Social History   Socioeconomic History   Marital status: Widowed    Spouse name: Not on file   Number of children: 3   Years of education: 41   Highest education level: 12th grade  Occupational History   Occupation: Retired Investment banker, operational  Tobacco Use   Smoking status: Never   Smokeless tobacco: Never  Vaping Use   Vaping status: Never Used  Substance and Sexual Activity   Alcohol use: Yes    Alcohol/week: 4.0 standard drinks of alcohol    Types: 4 Standard drinks or equivalent per week    Comment: beer or vodka occasionally   Drug use: Yes    Types: Marijuana    Comment: 3-6 X per week   Sexual activity: Yes  Other Topics Concern   Not on file  Social History Narrative   Patient lives alone in [place].    Patient has 3 children, grandchildren, and 1 great grandchild.    Patient is a retired Investment banker, operational. Patient enjoys cooking and enjoying his food.    Patient walks for exercise when the weather is nice, admits he could do more.    Social Determinants of Health   Financial Resource Strain: Low Risk  (12/06/2020)   Overall Financial Resource Strain (CARDIA)    Difficulty of Paying Living Expenses: Not hard at all  Food Insecurity: No Food Insecurity (12/06/2020)   Hunger Vital Sign    Worried About Running Out of Food in the Last Year: Never true    Ran Out of Food in the Last Year: Never true  Transportation Needs: No Transportation Needs (12/06/2020)   PRAPARE - Administrator, Civil Service (Medical): No    Lack of Transportation (Non-Medical): No  Physical Activity: Insufficiently Active (12/06/2020)   Exercise Vital Sign    Days of Exercise per Week: 3 days    Minutes of Exercise per Session: 30 min  Stress: No Stress Concern Present (12/06/2020)   [employer] of Occupational Health - Occupational Stress  Questionnaire    Feeling of Stress : Only a little  Social Connections: Moderately Isolated (12/06/2020)   Social Connection and Isolation Panel [NHANES]    Frequency of Communication with Friends and Family: More than three times a week    Frequency of Social Gatherings with Friends and Family: More than three times a week    Attends Religious Services: More than 4 times per year    Active Member of [employer] or Organizations: No    Attends Banker Meetings: Never    Marital Status: Widowed    Tobacco Counseling Counseling  given: Not Answered   Clinical Intake:                        Activities of Daily Living     No data to display          Patient Care Team: Latrelle Dodrill, MD as PCP - General (Family Medicine) Meriam Sprague, MD as PCP - Cardiology (Cardiology)  Indicate any recent Medical Services you may have received from other than [facility] providers in the past year (date may be approximate).     Assessment:   This is a routine wellness examination for Chayan.  Hearing/Vision screen No results found.  Dietary issues and exercise activities discussed:     Goals Addressed   None    Depression Screen    09/24/2022    8:24 AM 12/11/2021   11:06 AM 06/05/2021    9:27 AM 12/06/2020    3:06 PM 12/05/2020   11:35 AM 12/14/2019   11:02 AM 08/05/2019    9:54 AM  PHQ 2/9 Scores  PHQ - 2 Score 0 0 0 0 0 0 0  PHQ- 9 Score 0 0 0 0 0 0     Fall Risk    09/24/2022    8:24 AM 06/05/2021    9:27 AM 12/06/2020    3:12 PM 12/05/2020   11:34 AM 12/14/2019   11:02 AM  Fall Risk   Falls in the past year? 0 0 0 0 0  Number falls in past yr: 0 0 0 0 0  Injury with Fall? 0 0 0 0 0  Risk for fall due to :   No Fall Risks    Follow up   Falls prevention discussed      MEDICARE RISK AT HOME:   TIMED UP AND GO:  Was the test performed?  No    Cognitive Function:        12/06/2020    3:13 PM  6CIT Screen  What Year? 0  points  What month? 0 points  What time? 0 points  Count back from 20 0 points  Months in reverse 0 points  Repeat phrase 0 points  Total Score 0 points    Immunizations Immunization History  Administered Date(s) Administered   Influenza Whole 01/05/2009   Janssen (J&J) SARS-COV-2 Vaccination 05/29/2019, 01/27/2020   Pneumococcal Conjugate-13 06/12/2016   Pneumococcal Polysaccharide-23 04/24/2018   Zoster Recombinant(Shingrix) 10/08/2020    TDAP status: Due, Education has been provided regarding the importance of this vaccine. Advised may receive this vaccine at local pharmacy or Health Dept. Aware to provide a copy of the vaccination record if obtained from local pharmacy or Health Dept. Verbalized acceptance and understanding.  Flu Vaccine status: Due, Education has been provided regarding the importance of this vaccine. Advised may receive this vaccine at local pharmacy or Health Dept. Aware to provide a copy of the vaccination record if obtained from local pharmacy or Health Dept. Verbalized acceptance and understanding.  Pneumococcal vaccine status: Up to date  Covid-19 vaccine status: Information provided on how to obtain vaccines.   Qualifies for Shingles Vaccine? Yes   Zostavax completed No   Shingrix Completed?: No.    Education has been provided regarding the importance of this vaccine. Patient has been advised to call insurance company to determine out of pocket expense if they have not yet received this vaccine. Advised may also receive vaccine at local pharmacy or Health Dept. Verbalized acceptance and understanding.  Screening Tests Health Maintenance  Topic Date Due   DTaP/Tdap/Td (1 - Tdap) Never done   Zoster Vaccines- Shingrix (2 of 2) 12/03/2020   COVID-19 Vaccine (3 - 2023-24 season) 10/26/2021   Medicare Annual Wellness (AWV)  12/06/2021   INFLUENZA VACCINE  09/26/2022   Diabetic kidney evaluation - Urine ACR  12/12/2022   HEMOGLOBIN A1C  03/27/2023    OPHTHALMOLOGY EXAM  08/16/2023   Diabetic kidney evaluation - eGFR measurement  08/23/2023   FOOT EXAM  09/24/2023   Pneumonia Vaccine 16+ Years old  Completed   Hepatitis C Screening  Completed   HPV VACCINES  Aged Out   Colonoscopy  Discontinued    Health Maintenance  Health Maintenance Due  Topic Date Due   DTaP/Tdap/Td (1 - Tdap) Never done   Zoster Vaccines- Shingrix (2 of 2) 12/03/2020   COVID-19 Vaccine (3 - 2023-24 season) 10/26/2021   Medicare Annual Wellness (AWV)  12/06/2021   INFLUENZA VACCINE  09/26/2022    Colorectal cancer screening: No longer required.   Lung Cancer Screening: (Low Dose CT Chest recommended if Age 54-80 years, 20 pack-year currently smoking OR have quit w/in 15years.) does not qualify.   Lung Cancer Screening Referral: n/a  Additional Screening:  Hepatitis C Screening: does qualify; Completed 06/12/16  Vision Screening: Recommended annual ophthalmology exams for early detection of glaucoma and other disorders of the eye. Is the patient up to date with their annual eye exam?  Yes  Who is the provider or what is the name of the office in which the patient attends annual eye exams? Last with Dr.Shapiro  If pt is not established with a provider, would they like to be referred to a provider to establish care? No .   Dental Screening: Recommended annual dental exams for proper oral hygiene  Diabetic Foot Exam: Diabetic Foot Exam: Overdue, Pt has been advised about the importance in completing this exam. Pt is scheduled for diabetic foot exam on at next office visit.  Community Resource Referral / Chronic Care Management: CRR required this visit?  {YES/NO:21197}  CCM required this visit?  {CCM Required choices:[phone]}     Plan:     I have personally reviewed and noted the following in the patient's chart:   Medical and social history Use of alcohol, tobacco or illicit drugs  Current medications and supplements including opioid  prescriptions. {Opioid [phone]} Functional ability and status Nutritional status Physical activity Advanced directives List of other physicians Hospitalizations, surgeries, and ER visits in previous 12 months Vitals Screenings to include cognitive, depression, and falls Referrals and appointments  In addition, I have reviewed and discussed with patient certain preventive protocols, quality metrics, and best practice recommendations. A written personalized care plan for preventive services as well as general preventive health recommendations were provided to patient.     Kandis Fantasia [place], [place]   6/60/6301   After Visit Summary: {CHL AMB AWV After Visit [phone]}  Nurse Notes: ***

## 2022-11-02 DIAGNOSIS — Z955 Presence of coronary angioplasty implant and graft: Secondary | ICD-10-CM | POA: Diagnosis not present

## 2022-11-02 DIAGNOSIS — R531 Weakness: Secondary | ICD-10-CM | POA: Diagnosis not present

## 2022-11-02 DIAGNOSIS — I2510 Atherosclerotic heart disease of native coronary artery without angina pectoris: Secondary | ICD-10-CM | POA: Diagnosis not present

## 2022-11-02 DIAGNOSIS — Z743 Need for continuous supervision: Secondary | ICD-10-CM | POA: Diagnosis not present

## 2022-11-02 DIAGNOSIS — R9431 Abnormal electrocardiogram [ECG] [EKG]: Secondary | ICD-10-CM | POA: Diagnosis not present

## 2022-11-02 DIAGNOSIS — I10 Essential (primary) hypertension: Secondary | ICD-10-CM | POA: Diagnosis not present

## 2022-11-02 DIAGNOSIS — W01198A Fall on same level from slipping, tripping and stumbling with subsequent striking against other object, initial encounter: Secondary | ICD-10-CM | POA: Diagnosis not present

## 2022-11-02 DIAGNOSIS — R55 Syncope and collapse: Secondary | ICD-10-CM | POA: Diagnosis not present

## 2022-11-02 DIAGNOSIS — E119 Type 2 diabetes mellitus without complications: Secondary | ICD-10-CM | POA: Diagnosis not present

## 2022-11-02 DIAGNOSIS — E785 Hyperlipidemia, unspecified: Secondary | ICD-10-CM | POA: Diagnosis not present

## 2022-11-02 DIAGNOSIS — I498 Other specified cardiac arrhythmias: Secondary | ICD-10-CM | POA: Diagnosis not present

## 2022-11-02 DIAGNOSIS — Z043 Encounter for examination and observation following other accident: Secondary | ICD-10-CM | POA: Diagnosis not present

## 2022-11-02 DIAGNOSIS — S0081XA Abrasion of other part of head, initial encounter: Secondary | ICD-10-CM | POA: Diagnosis not present

## 2022-11-02 DIAGNOSIS — R079 Chest pain, unspecified: Secondary | ICD-10-CM | POA: Diagnosis not present

## 2022-11-02 DIAGNOSIS — S0181XA Laceration without foreign body of other part of head, initial encounter: Secondary | ICD-10-CM | POA: Diagnosis not present

## 2022-11-02 DIAGNOSIS — Y92480 Sidewalk as the place of occurrence of the external cause: Secondary | ICD-10-CM | POA: Diagnosis not present

## 2022-11-02 DIAGNOSIS — I517 Cardiomegaly: Secondary | ICD-10-CM | POA: Diagnosis not present

## 2022-11-03 DIAGNOSIS — S0081XA Abrasion of other part of head, initial encounter: Secondary | ICD-10-CM | POA: Diagnosis not present

## 2022-11-03 DIAGNOSIS — E785 Hyperlipidemia, unspecified: Secondary | ICD-10-CM | POA: Diagnosis not present

## 2022-11-03 DIAGNOSIS — R9431 Abnormal electrocardiogram [ECG] [EKG]: Secondary | ICD-10-CM | POA: Diagnosis not present

## 2022-11-03 DIAGNOSIS — Z955 Presence of coronary angioplasty implant and graft: Secondary | ICD-10-CM | POA: Diagnosis not present

## 2022-11-03 DIAGNOSIS — E119 Type 2 diabetes mellitus without complications: Secondary | ICD-10-CM | POA: Diagnosis not present

## 2022-11-03 DIAGNOSIS — S0181XA Laceration without foreign body of other part of head, initial encounter: Secondary | ICD-10-CM | POA: Diagnosis not present

## 2022-11-03 DIAGNOSIS — I10 Essential (primary) hypertension: Secondary | ICD-10-CM | POA: Diagnosis not present

## 2022-11-03 DIAGNOSIS — Y92480 Sidewalk as the place of occurrence of the external cause: Secondary | ICD-10-CM | POA: Diagnosis not present

## 2022-11-03 DIAGNOSIS — W01198A Fall on same level from slipping, tripping and stumbling with subsequent striking against other object, initial encounter: Secondary | ICD-10-CM | POA: Diagnosis not present

## 2022-11-03 DIAGNOSIS — R079 Chest pain, unspecified: Secondary | ICD-10-CM | POA: Diagnosis not present

## 2022-11-03 DIAGNOSIS — I498 Other specified cardiac arrhythmias: Secondary | ICD-10-CM | POA: Diagnosis not present

## 2022-11-03 DIAGNOSIS — I2510 Atherosclerotic heart disease of native coronary artery without angina pectoris: Secondary | ICD-10-CM | POA: Diagnosis not present

## 2022-11-03 DIAGNOSIS — I358 Other nonrheumatic aortic valve disorders: Secondary | ICD-10-CM | POA: Diagnosis not present

## 2022-11-03 DIAGNOSIS — R55 Syncope and collapse: Secondary | ICD-10-CM | POA: Diagnosis not present

## 2022-11-06 ENCOUNTER — Ambulatory Visit (INDEPENDENT_AMBULATORY_CARE_PROVIDER_SITE_OTHER): Payer: Medicare Other

## 2022-11-06 VITALS — BP 117/83 | HR 68 | Ht 71.0 in | Wt 158.0 lb

## 2022-11-06 DIAGNOSIS — I253 Aneurysm of heart: Secondary | ICD-10-CM | POA: Insufficient documentation

## 2022-11-06 DIAGNOSIS — W19XXXA Unspecified fall, initial encounter: Secondary | ICD-10-CM | POA: Insufficient documentation

## 2022-11-06 NOTE — Assessment & Plan Note (Signed)
Patient presents for a fall that happened about a week ago, while visiting in [place].  Patient had witnessed fall, and seemed to suddenly just passed out.  Patient was taken to the hospital, where he had a negative workup of, lab work, CT head, echocardiogram, chest x-ray.  Patient denies any prodromal symptoms prior to fall/passing out, or seizure-like activity.  Patient had negative orthostatic blood pressure today in clinic.  Patient's workup has been unremarkable thus far, except he has not been evaluated for arrhythmia. Will have patient wear 2 weeks Zio patch to rule out any arrhythmia.  Unsure what caused patient's fall, but does not appear to be coming from any cardiac/intracranial/orthostatic/infectious etiology. - Referral for Zio patch

## 2022-11-06 NOTE — Assessment & Plan Note (Signed)
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  Discussed findings with patient, recommend patient follow-up with PCP. - Follow-up with PCP

## 2022-11-06 NOTE — Patient Instructions (Signed)
It was great to see you! Thank you for allowing me to participate in your care!  I recommend that you always bring your medications to each appointment as this makes it easy to ensure we are on the correct medications and helps [place] not miss when refills are needed.  Our plans for today:  - Fall We don't know why you fell, but the workup performed in [place] was good and negative. We are going to get you a heart monitor to wear for 14 days. Someone from clinic should call you about placing it.   - Echo Cardiogram You had some abnormal findings on your heart ultrasound (Echo Cardiogram).It noted that you have a aneurism between the sides of your heart. We want you to follow up with your PCP about this.  - Pain Control  Continue to eat soft food and use tylenol as needed.    Take care and seek immediate care sooner if you develop any concerns.   Dr. Bess Kinds, MD [facility]

## 2022-11-06 NOTE — Progress Notes (Cosign Needed Addendum)
SUBJECTIVE:   CHIEF COMPLAINT / HPI:   Fall/light headed Was walking up the block, stopped and leaned against a fence, and then passed out. Patient notes that he fell twice, first time he fell backwards, then got up and began walking into apartment, then he fell face first. Denies any prodromal symptoms, just noted he felt light-headed before passing out. He doesn't remember the time between both falls. He notes the witness denied any shaking/bowel/bladder incontinence. He reports that EMS was called and brought him to the hospital.  He had eaten and drank fluids that day. Note's that it was around 10 am, was humid, but not too hot. Notes he had a little etoh prior to event.   He has documents from his hospital visit, that demonstrating an echo and BMP and CXR were benign.   Notes some hx of Etoh and marijana use.   PERTINENT  PMH / PSH:   OBJECTIVE:  BP 117/83 Comment: Standing  Pulse 68   Ht 5\' 11"  (1.803 m)   Wt 158 lb (71.7 kg)   SpO2 99%   BMI 22.04 kg/m  Physical Exam Constitutional:      General: He is not in acute distress.    Appearance: Normal appearance. He is not ill-appearing.  Eyes:     General: No visual field deficit. Cardiovascular:     Rate and Rhythm: Normal rate and regular rhythm.     Pulses: Normal pulses.     Heart sounds: Normal heart sounds. No murmur heard.    No friction rub. No gallop.  Pulmonary:     Effort: Pulmonary effort is normal. No respiratory distress.     Breath sounds: Normal breath sounds. No stridor. No wheezing, rhonchi or rales.  Neurological:     General: No focal deficit present.     Mental Status: He is alert. Mental status is at baseline.     Cranial Nerves: Cranial nerves 2-12 are intact. No cranial nerve deficit, dysarthria or facial asymmetry.     Sensory: Sensation is intact. No sensory deficit.     Motor: Motor function is intact. No weakness.     Coordination: Finger-Nose-Finger Test and Heel to [place] Test normal.      Gait: Gait is intact. Gait normal.  Psychiatric:        Mood and Affect: Mood normal.        Behavior: Behavior normal.      ASSESSMENT/PLAN:  Fall, initial encounter Assessment & Plan: Patient presents for a fall that happened about a week ago, while visiting in [place].  Patient had witnessed fall, and seemed to suddenly just passed out.  Patient was taken to the hospital, where he had a negative workup of, lab work, CT head, echocardiogram, chest x-ray.  Patient denies any prodromal symptoms prior to fall/passing out, or seizure-like activity.  Patient had negative orthostatic blood pressure today in clinic.  Patient's workup has been unremarkable thus far, except he has not been evaluated for arrhythmia. Will have patient wear 2 weeks Zio patch to rule out any arrhythmia.  Unsure what caused patient's fall, but does not appear to be coming from any cardiac/intracranial/orthostatic/infectious etiology. 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  Discussed findings with patient, recommend patient follow-up with PCP. -  Follow-up with PCP    No follow-ups on file. Bess Kinds, MD 11/06/2022, 5:01 PM PGY-3, [facility]

## 2022-11-07 ENCOUNTER — Encounter: Payer: Self-pay | Admitting: Student

## 2022-11-12 ENCOUNTER — Ambulatory Visit (INDEPENDENT_AMBULATORY_CARE_PROVIDER_SITE_OTHER): Payer: Medicare Other | Admitting: *Deleted

## 2022-11-12 DIAGNOSIS — Z0000 Encounter for general adult medical examination without abnormal findings: Secondary | ICD-10-CM | POA: Diagnosis not present

## 2022-11-12 NOTE — Patient Instructions (Signed)
Mr. Adam Burton , Thank you for taking time to come for your Medicare Wellness Visit. I appreciate your ongoing commitment to your health goals. Please review the following plan we discussed and let me know if I can assist you in the future.   Screening recommendations/referrals: Colonoscopy: Education provided Recommended yearly ophthalmology/optometry visit for glaucoma screening and checkup Recommended yearly dental visit for hygiene and checkup  Vaccinations: Influenza vaccine: Education provided Pneumococcal vaccine: up to date Tdap vaccine:  Education provided Shingles vaccine: Education provided    Advanced directives: Education provided     Preventive Care 65 Years and Older, Male Preventive care refers to lifestyle choices and visits with your health care provider that can promote health and wellness. What does preventive care include? A yearly physical exam. This is also called an annual well check. Dental exams once or twice a year. Routine eye exams. Ask your health care provider how often you should have your eyes checked. Personal lifestyle choices, including: Daily care of your teeth and gums. Regular physical activity. Eating a healthy diet. Avoiding tobacco and drug use. Limiting alcohol use. Practicing safe sex. Taking low doses of aspirin every day. Taking vitamin and mineral supplements as recommended by your health care provider. What happens during an annual well check? The services and screenings done by your health care provider during your annual well check will depend on your age, overall health, lifestyle risk factors, and family history of disease. Counseling  Your health care provider may ask you questions about your: Alcohol use. Tobacco use. Drug use. Emotional well-being. Home and relationship well-being. Sexual activity. Eating habits. History of falls. Memory and ability to understand (cognition). Work and work Astronomer. Screening  You  may have the following tests or measurements: Height, weight, and BMI. Blood pressure. Lipid and cholesterol levels. These may be checked every 5 years, or more frequently if you are over 48 years old. Skin check. Lung cancer screening. You may have this screening every year starting at age 37 if you have a 30-pack-year history of smoking and currently smoke or have quit within the past 15 years. Fecal occult blood test (FOBT) of the stool. You may have this test every year starting at age 72. Flexible sigmoidoscopy or colonoscopy. You may have a sigmoidoscopy every 5 years or a colonoscopy every 10 years starting at age 88. Prostate cancer screening. Recommendations will vary depending on your family history and other risks. Hepatitis C blood test. Hepatitis B blood test. Sexually transmitted disease (STD) testing. Diabetes screening. This is done by checking your blood sugar (glucose) after you have not eaten for a while (fasting). You may have this done every 1-3 years. Abdominal aortic aneurysm (AAA) screening. You may need this if you are a current or former smoker. Osteoporosis. You may be screened starting at age 90 if you are at high risk. Talk with your health care provider about your test results, treatment options, and if necessary, the need for more tests. Vaccines  Your health care provider may recommend certain vaccines, such as: Influenza vaccine. This is recommended every year. Tetanus, diphtheria, and acellular pertussis (Tdap, Td) vaccine. You may need a Td booster every 10 years. Zoster vaccine. You may need this after age 29. Pneumococcal 13-valent conjugate (PCV13) vaccine. One dose is recommended after age 82. Pneumococcal polysaccharide (PPSV23) vaccine. One dose is recommended after age 19. Talk to your health care provider about which screenings and vaccines you need and how often you need them. This information  is not intended to replace advice given to you by your  health care provider. Make sure you discuss any questions you have with your health care provider. Document Released: 03/10/2015 Document Revised: 11/01/2015 Document Reviewed: 12/13/2014 Elsevier Interactive Patient Education  2017 [employer].  Fall Prevention in the Home Falls can cause injuries. They can happen to people of all ages. There are many things you can do to make your home safe and to help prevent falls. What can I do on the outside of my home? Regularly fix the edges of walkways and driveways and fix any cracks. Remove anything that might make you trip as you walk through a door, such as a raised step or threshold. Trim any bushes or trees on the path to your home. Use bright outdoor lighting. Clear any walking paths of anything that might make someone trip, such as rocks or tools. Regularly check to see if handrails are loose or broken. Make sure that both sides of any steps have handrails. Any raised decks and porches should have guardrails on the edges. Have any leaves, snow, or ice cleared regularly. Use sand or salt on walking paths during winter. Clean up any spills in your garage right away. This includes oil or grease spills. What can I do in the bathroom? Use night lights. Install grab bars by the toilet and in the tub and shower. Do not use towel bars as grab bars. Use non-skid mats or decals in the tub or shower. If you need to sit down in the shower, use a plastic, non-slip stool. Keep the floor dry. Clean up any water that spills on the floor as soon as it happens. Remove soap buildup in the tub or shower regularly. Attach bath mats securely with double-sided non-slip rug tape. Do not have throw rugs and other things on the floor that can make you trip. What can I do in the bedroom? Use night lights. Make sure that you have a light by your bed that is easy to reach. Do not use any sheets or blankets that are too big for your bed. They should not hang down  onto the floor. Have a firm chair that has side arms. You can use this for support while you get dressed. Do not have throw rugs and other things on the floor that can make you trip. What can I do in the kitchen? Clean up any spills right away. Avoid walking on wet floors. Keep items that you use a lot in easy-to-reach places. If you need to reach something above you, use a strong step stool that has a grab bar. Keep electrical cords out of the way. Do not use floor polish or wax that makes floors slippery. If you must use wax, use non-skid floor wax. Do not have throw rugs and other things on the floor that can make you trip. What can I do with my stairs? Do not leave any items on the stairs. Make sure that there are handrails on both sides of the stairs and use them. Fix handrails that are broken or loose. Make sure that handrails are as long as the stairways. Check any carpeting to make sure that it is firmly attached to the stairs. Fix any carpet that is loose or worn. Avoid having throw rugs at the top or bottom of the stairs. If you do have throw rugs, attach them to the floor with carpet tape. Make sure that you have a light switch at the top of  the stairs and the bottom of the stairs. If you do not have them, ask someone to add them for you. What else can I do to help prevent falls? Wear shoes that: Do not have high heels. Have rubber bottoms. Are comfortable and fit you well. Are closed at the toe. Do not wear sandals. If you use a stepladder: Make sure that it is fully opened. Do not climb a closed stepladder. Make sure that both sides of the stepladder are locked into place. Ask someone to hold it for you, if possible. Clearly mark and make sure that you can see: Any grab bars or handrails. First and last steps. Where the edge of each step is. Use tools that help you move around (mobility aids) if they are needed. These include: Canes. Walkers. Scooters. Crutches. Turn  on the lights when you go into a dark area. Replace any light bulbs as soon as they burn out. Set up your furniture so you have a clear path. Avoid moving your furniture around. If any of your floors are uneven, fix them. If there are any pets around you, be aware of where they are. Review your medicines with your doctor. Some medicines can make you feel dizzy. This can increase your chance of falling. Ask your doctor what other things that you can do to help prevent falls. This information is not intended to replace advice given to you by your health care provider. Make sure you discuss any questions you have with your health care provider. Document Released: 12/08/2008 Document Revised: 07/20/2015 Document Reviewed: 03/18/2014 Elsevier Interactive Patient Education  2017 [employer].

## 2022-11-12 NOTE — Progress Notes (Signed)
Subjective:   Adam Burton is a 72 y.o. male who presents for Medicare Annual/Subsequent preventive examination.  Visit Complete: Virtual  I connected with  Adam Burton on 11/12/22 by a audio enabled telemedicine application and verified that I am speaking with the correct person using two identifiers.  Patient Location: Home  Provider Location: Home Office  I discussed the limitations of evaluation and management by telemedicine. The patient expressed understanding and agreed to proceed.  Patient reviewed and answered online some question confirmed answers with patient   Vital Signs: Unable to obtain new vitals due to this being a telehealth visit.   Cardiac Risk Factors include: advanced age (>69men, >76 women);diabetes mellitus;male gender;family history of premature cardiovascular disease     Objective:    There were no vitals filed for this visit. There is no height or weight on file to calculate BMI.     11/12/2022    8:50 AM 11/06/2022    8:55 AM 08/23/2022    1:53 PM 12/11/2021   11:06 AM 06/05/2021    9:35 AM 12/06/2020    3:11 PM 12/14/2019   11:03 AM  Advanced Directives  Does Patient Have a Medical Advance Directive? No No No No No No No  Would patient like information on creating a medical advance directive? No - Patient declined    No - Patient declined Yes (MAU/Ambulatory/Procedural Areas - Information given) No - Patient declined    Current Medications (verified) Outpatient Encounter Medications as of 11/12/2022  Medication Sig   acetaminophen (TYLENOL) 500 MG tablet Take 2 tablets (1,000 mg total) by mouth every 8 (eight) hours as needed for moderate pain.   amLODipine (NORVASC) 10 MG tablet Take 1 tablet (10 mg total) by mouth daily.   aspirin EC 81 MG tablet Take 1 tablet (81 mg total) by mouth daily.   esomeprazole (NEXIUM) 40 MG capsule Take 1 capsule (40 mg total) by mouth daily at 12 noon.   metoprolol succinate (TOPROL-XL) 50 MG 24 hr tablet  Take 1 tablet (50 mg total) by mouth daily. Take with or immediately following a meal.   nitroGLYCERIN (NITROSTAT) 0.4 MG SL tablet Place 1 tablet (0.4 mg total) under the tongue every 5 (five) minutes as needed for chest pain.   polyethylene glycol powder (MIRALAX) 17 GM/SCOOP powder Take 17 g by mouth daily as needed (constipation).   rosuvastatin (CRESTOR) 20 MG tablet Take 1 tablet (20 mg total) by mouth daily.   tamsulosin (FLOMAX) 0.4 MG CAPS capsule Take 2 capsules (0.8 mg total) by mouth daily.   famotidine (PEPCID) 20 MG tablet Take 1 tablet (20 mg total) by mouth 2 (two) times daily. (Patient not taking: Reported on 11/12/2022)   metFORMIN (GLUCOPHAGE-XR) 500 MG 24 hr tablet TAKE 1 TABLET BY MOUTH DAILY  WITH BREAKFAST   No facility-administered encounter medications on file as of 11/12/2022.    Allergies (verified) Patient has no known allergies.   History: Past Medical History:  Diagnosis Date   CAD (coronary artery disease)    S/p DES to prox to mid LAD in 07/2018 // Myoview 9/22: No ischemia or infarction, EF 59, low risk   Diabetes mellitus without complication (HCC)    Hypercholesteremia    Hypertension    Renal insufficiency 08/21/2012   Past Surgical History:  Procedure Laterality Date   CARDIAC CATHETERIZATION  01/2009   Dr. [place]. Normal coronary arteries. Normal LV function.   CORONARY ANGIOGRAPHY N/A 08/04/2018   Procedure: CORONARY ANGIOGRAPHY (CATH LAB);  Surgeon: Elder Negus, MD;  Location: [facility] INVASIVE CV LAB;  Service: Cardiovascular;  Laterality: N/A;   CORONARY STENT INTERVENTION N/A 08/04/2018   Procedure: CORONARY STENT INTERVENTION;  Surgeon: Elder Negus, MD;  Location: [facility] INVASIVE CV LAB;  Service: Cardiovascular;  Laterality: N/A;   HERNIA REPAIR     NM MYOVIEW LTD  12/2008   Potential small area of mild ischemia in the basal inferoseptal wall. Otherwise normal. --> False positive test based on coronary angiography   Stress  Echocardiogram  12/2013   Exercise for just over 4 minutes. Heart rate increased to 148 BPM. Hypertensive response to exercise. EF 60%. No evidence of ischemia. NORMAL STUDY   TRANSTHORACIC ECHOCARDIOGRAM  11/2013   EF 55-60%. No regional wall motion abnormality is. Normal thickness. Mild LA dilation.   Family History  Problem Relation Age of Onset   Heart attack Mother    Diabetes Mother    Diabetes Sister    Diabetes Maternal Grandmother    Heart attack Maternal Grandmother    Diabetes Maternal Aunt    Colon cancer Neg Hx    Colon polyps Neg Hx    Esophageal cancer Neg Hx    Rectal cancer Neg Hx    Stomach cancer Neg Hx    Social History   Socioeconomic History   Marital status: Widowed    Spouse name: Not on file   Number of children: 3   Years of education: 60   Highest education level: 12th grade  Occupational History   Occupation: Retired Investment banker, operational  Tobacco Use   Smoking status: Never   Smokeless tobacco: Never  Vaping Use   Vaping status: Never Used  Substance and Sexual Activity   Alcohol use: Yes    Alcohol/week: 4.0 standard drinks of alcohol    Types: 4 Standard drinks or equivalent per week    Comment: beer or vodka occasionally   Drug use: Yes    Types: Marijuana    Comment: 3-6 X per week   Sexual activity: Yes  Other Topics Concern   Not on file  Social History Narrative   Patient lives alone in [place].    Patient has 3 children, grandchildren, and 1 great grandchild.    Patient is a retired Investment banker, operational. Patient enjoys cooking and enjoying his food.    Patient walks for exercise when the weather is nice, admits he could do more.    Social Determinants of Health   Financial Resource Strain: Low Risk  (11/12/2022)   Overall Financial Resource Strain (CARDIA)    Difficulty of Paying Living Expenses: Not hard at all  Food Insecurity: No Food Insecurity (11/12/2022)   Hunger Vital Sign    Worried About Running Out of Food in the Last Year: Never true    Ran  Out of Food in the Last Year: Never true  Transportation Needs: No Transportation Needs (11/12/2022)   PRAPARE - Administrator, Civil Service (Medical): No    Lack of Transportation (Non-Medical): No  Physical Activity: Insufficiently Active (11/12/2022)   Exercise Vital Sign    Days of Exercise per Week: 3 days    Minutes of Exercise per Session: 30 min  Stress: No Stress Concern Present (11/12/2022)   [employer] of Occupational Health - Occupational Stress Questionnaire    Feeling of Stress : Not at all  Social Connections: Socially Isolated (11/12/2022)   Social Connection and Isolation Panel [NHANES]    Frequency of Communication with Friends and Family:  Three times a week    Frequency of Social Gatherings with Friends and Family: Once a week    Attends Religious Services: Never    Database administrator or Organizations: No    Attends Banker Meetings: Never    Marital Status: Widowed    Tobacco Counseling Counseling given: Not Answered   Clinical Intake:  Pre-visit preparation completed: Yes  Pain : No/denies pain     Diabetes: Yes CBG done?: No Did pt. bring in CBG monitor from home?: No  How often do you need to have someone help you when you read instructions, pamphlets, or other written materials from your doctor or pharmacy?: 1 - Never  Interpreter Needed?: No  Information entered by :: Remi Haggard LPN   Activities of Daily Living    11/12/2022    8:55 AM  In your present state of health, do you have any difficulty performing the following activities:  Hearing? 0  Vision? 0  Difficulty concentrating or making decisions? 0  Walking or climbing stairs? 0  Dressing or bathing? 0  Doing errands, shopping? 0  Preparing Food and eating ? N  Using the Toilet? N  In the past six months, have you accidently leaked urine? Y  Do you have problems with loss of bowel control? N  Managing your Medications? N  Managing your Finances?  N  Housekeeping or managing your Housekeeping? N    Patient Care Team: Latrelle Dodrill, MD as PCP - General (Family Medicine) Meriam Sprague, MD as PCP - Cardiology (Cardiology)  Indicate any recent Medical Services you may have received from other than [facility] providers in the past year (date may be approximate).     Assessment:   This is a routine wellness examination for Serafim.  Hearing/Vision screen Hearing Screening - Comments:: No trouble hearing Vision Screening - Comments:: Not up to date Had cataract removed left eye Looking for new doctor Information given hecker and [place]   Goals Addressed   None    Depression Screen    11/12/2022    8:57 AM 11/06/2022    8:54 AM 09/24/2022    8:24 AM 12/11/2021   11:06 AM 06/05/2021    9:27 AM 12/06/2020    3:06 PM 12/05/2020   11:35 AM  PHQ 2/9 Scores  PHQ - 2 Score 0 0 0 0 0 0 0  PHQ- 9 Score 0 0 0 0 0 0 0    Fall Risk    11/12/2022    8:49 AM 11/06/2022    8:54 AM 09/24/2022    8:24 AM 06/05/2021    9:27 AM 12/06/2020    3:12 PM  Fall Risk   Falls in the past year? 1 0 0 0 0  Number falls in past yr: 1 0 0 0 0  Injury with Fall? 1 0 0 0 0  Comment bruised face no broken bones / cut/ chipped tooth      Risk for fall due to :     No Fall Risks  Follow up     Falls prevention discussed    MEDICARE RISK AT HOME: Medicare Risk at Home Any stairs in or around the home?: Yes If so, are there any without handrails?: No Home free of loose throw rugs in walkways, pet beds, electrical cords, etc?: Yes Adequate lighting in your home to reduce risk of falls?: Yes Life alert?: No Use of a cane, walker or w/c?: No Grab bars in the  bathroom?: No Shower chair or bench in shower?: No Elevated toilet seat or a handicapped toilet?: No  TIMED UP AND GO:  Was the test performed?  No    Cognitive Function:        11/12/2022    8:57 AM 12/06/2020    3:13 PM  6CIT Screen  What Year? 0 points 0 points  What month?  0 points 0 points  What time? 0 points 0 points  Count back from 20 0 points 0 points  Months in reverse 4 points 0 points  Repeat phrase 0 points 0 points  Total Score 4 points 0 points    Immunizations Immunization History  Administered Date(s) Administered   Influenza Whole 01/05/2009   Janssen (J&J) SARS-COV-2 Vaccination 05/29/2019, 01/27/2020   Pneumococcal Conjugate-13 06/12/2016   Pneumococcal Polysaccharide-23 04/24/2018   Zoster Recombinant(Shingrix) 10/08/2020    TDAP status: Up to date  Flu Vaccine status: Due, Education has been provided regarding the importance of this vaccine. Advised may receive this vaccine at local pharmacy or Health Dept. Aware to provide a copy of the vaccination record if obtained from local pharmacy or Health Dept. Verbalized acceptance and understanding.  Pneumococcal vaccine status: Up to date  Covid-19 vaccine status: Information provided on how to obtain vaccines.   Qualifies for Shingles Vaccine? Yes   Zostavax completed No   Shingrix Completed?: No.    Education has been provided regarding the importance of this vaccine. Patient has been advised to call insurance company to determine out of pocket expense if they have not yet received this vaccine. Advised may also receive vaccine at local pharmacy or Health Dept. Verbalized acceptance and understanding.  Screening Tests Health Maintenance  Topic Date Due   Diabetic kidney evaluation - Urine ACR  12/12/2022   COVID-19 Vaccine (3 - 2023-24 season) 11/28/2022 (Originally 10/27/2022)   Zoster Vaccines- Shingrix (2 of 2) 02/11/2023 (Originally 12/03/2020)   INFLUENZA VACCINE  05/26/2023 (Originally 09/26/2022)   HEMOGLOBIN A1C  03/27/2023   OPHTHALMOLOGY EXAM  08/16/2023   Diabetic kidney evaluation - eGFR measurement  08/23/2023   FOOT EXAM  09/24/2023   Medicare Annual Wellness (AWV)  11/12/2023   Pneumonia Vaccine 1+ Years old  Completed   Hepatitis C Screening  Completed   HPV  VACCINES  Aged Out   DTaP/Tdap/Td  Discontinued   Colonoscopy  Discontinued    Health Maintenance  Health Maintenance Due  Topic Date Due   Diabetic kidney evaluation - Urine ACR  12/12/2022    Colorectal cancer screening: No longer required.   Lung Cancer Screening: (Low Dose CT Chest recommended if Age 38-80 years, 20 pack-year currently smoking OR have quit w/in 15years.) does not qualify.   Lung Cancer Screening Referral:   Additional Screening:  Hepatitis C Screening: does not qualify; Completed 2018  Vision Screening: Recommended annual ophthalmology exams for early detection of glaucoma and other disorders of the eye. Is the patient up to date with their annual eye exam?  No  Who is the provider or what is the name of the office in which the patient attends annual eye exams? Information given for new MD If pt is not established with a provider, would they like to be referred to a provider to establish care? No .   Dental Screening: Recommended annual dental exams for proper oral hygiene  Nutrition Risk Assessment:  Has the patient had any N/V/D within the last 2 months?  No  Does the patient have any non-healing wounds?  No  Has the patient had any unintentional weight loss or weight gain?  No   Diabetes:  Is the patient diabetic?  Yes  If diabetic, was a CBG obtained today?  No  Did the patient bring in their glucometer from home?  No  How often do you monitor your CBG's? Does not check .   Financial Strains and Diabetes Management:  Are you having any financial strains with the device, your supplies or your medication? No .  Does the patient want to be seen by Chronic Care Management for management of their diabetes?  No  Would the patient like to be referred to a Nutritionist or for Diabetic Management?  No   Diabetic Exams:  Diabetic Eye Exam: . Overdue for diabetic eye exam. Pt has been advised about the importance in completing this exam.     Information  given for new eye MD was with [facility]   Diabetic Foot Exam: . Pt has been advised about the importance in completing this exam.   Community Resource Referral / Chronic Care Management: CRR required this visit?  No   CCM required this visit?  No     Plan:     I have personally reviewed and noted the following in the patient's chart:   Medical and social history Use of alcohol, tobacco or illicit drugs  Current medications and supplements including opioid prescriptions. Patient is not currently taking opioid prescriptions. Functional ability and status Nutritional status Physical activity Advanced directives List of other physicians Hospitalizations, surgeries, and ER visits in previous 12 months Vitals Screenings to include cognitive, depression, and falls Referrals and appointments  In addition, I have reviewed and discussed with patient certain preventive protocols, quality metrics, and best practice recommendations. A written personalized care plan for preventive services as well as general preventive health recommendations were provided to patient.     Remi Haggard, LPN   [id number]   After Visit Summary: (MyChart) Due to this being a telephonic visit, the after visit summary with patients personalized plan was offered to patient via MyChart   Nurse Notes:

## 2022-12-07 ENCOUNTER — Other Ambulatory Visit: Payer: Self-pay | Admitting: Family Medicine

## 2022-12-16 NOTE — Progress Notes (Unsigned)
No chief complaint on file.  History of Present Illness: 72 yo male with history of CAD, HTN, hyperlipidemia, aortic valve insufficiency and diabetes who is here today for follow up. He was previously followed by Dr. Shari Prows. Cardiac cath in June 2020 with severe proximal to mid LAD stenosis treated with a drug eluting stent. Moderate proximal RCA stenosis. Nuclear stress test in 2022 with no ischemia. Echo in September 2024 in [place] with normal LV systolic function, mild AI. Vascular screening in 2022 with no evidence of carotid plaque, no AAA and normal ABI.   He is here today for follow up. The patient denies any chest pain, dyspnea, palpitations, lower extremity edema, orthopnea, PND, dizziness, near syncope or syncope. *** is he wearing a cardiac monitor  Primary Care Physician: Latrelle Dodrill, MD   Past Medical History:  Diagnosis Date   CAD (coronary artery disease)    S/p DES to prox to mid LAD in 07/2018 // Myoview 9/22: No ischemia or infarction, EF 59, low risk   Diabetes mellitus without complication (HCC)    Hypercholesteremia    Hypertension    Renal insufficiency 08/21/2012    Past Surgical History:  Procedure Laterality Date   CARDIAC CATHETERIZATION  01/2009   Dr. [place]. Normal coronary arteries. Normal LV function.   CORONARY ANGIOGRAPHY N/A 08/04/2018   Procedure: CORONARY ANGIOGRAPHY (CATH LAB);  Surgeon: Elder Negus, MD;  Location: [facility] INVASIVE CV LAB;  Service: Cardiovascular;  Laterality: N/A;   CORONARY STENT INTERVENTION N/A 08/04/2018   Procedure: CORONARY STENT INTERVENTION;  Surgeon: Elder Negus, MD;  Location: [facility] INVASIVE CV LAB;  Service: Cardiovascular;  Laterality: N/A;   HERNIA REPAIR     NM MYOVIEW LTD  12/2008   Potential small area of mild ischemia in the basal inferoseptal wall. Otherwise normal. --> False positive test based on coronary angiography   Stress Echocardiogram  12/2013   Exercise for just over 4 minutes.  Heart rate increased to 148 BPM. Hypertensive response to exercise. EF 60%. No evidence of ischemia. NORMAL STUDY   TRANSTHORACIC ECHOCARDIOGRAM  11/2013   EF 55-60%. No regional wall motion abnormality is. Normal thickness. Mild LA dilation.    Current Outpatient Medications  Medication Sig Dispense Refill   acetaminophen (TYLENOL) 500 MG tablet Take 2 tablets (1,000 mg total) by mouth every 8 (eight) hours as needed for moderate pain. 30 tablet 0   amLODipine (NORVASC) 10 MG tablet Take 1 tablet (10 mg total) by mouth daily. 90 tablet 2   aspirin EC 81 MG tablet Take 1 tablet (81 mg total) by mouth daily. 90 tablet 3   esomeprazole (NEXIUM) 40 MG capsule Take 1 capsule (40 mg total) by mouth daily at 12 noon. 90 capsule 2   famotidine (PEPCID) 20 MG tablet Take 1 tablet (20 mg total) by mouth 2 (two) times daily. (Patient not taking: Reported on 11/12/2022) 60 tablet 3   metFORMIN (GLUCOPHAGE-XR) 500 MG 24 hr tablet TAKE 1 TABLET BY MOUTH DAILY  WITH BREAKFAST 100 tablet 2   metoprolol succinate (TOPROL-XL) 50 MG 24 hr tablet Take 1 tablet (50 mg total) by mouth daily. Take with or immediately following a meal. 90 tablet 3   nitroGLYCERIN (NITROSTAT) 0.4 MG SL tablet Place 1 tablet (0.4 mg total) under the tongue every 5 (five) minutes as needed for chest pain. 30 tablet 3   polyethylene glycol powder (MIRALAX) 17 GM/SCOOP powder Take 17 g by mouth daily as needed (constipation). 255  g 2   rosuvastatin (CRESTOR) 20 MG tablet Take 1 tablet (20 mg total) by mouth daily. 90 tablet 3   tamsulosin (FLOMAX) 0.4 MG CAPS capsule Take 2 capsules (0.8 mg total) by mouth daily. 60 capsule 3   No current facility-administered medications for this visit.    No Known Allergies  Social History   Socioeconomic History   Marital status: Widowed    Spouse name: Not on file   Number of children: 3   Years of education: 31   Highest education level: 12th grade  Occupational History   Occupation:  Retired Investment banker, operational  Tobacco Use   Smoking status: Never   Smokeless tobacco: Never  Vaping Use   Vaping status: Never Used  Substance and Sexual Activity   Alcohol use: Yes    Alcohol/week: 4.0 standard drinks of alcohol    Types: 4 Standard drinks or equivalent per week    Comment: beer or vodka occasionally   Drug use: Yes    Types: Marijuana    Comment: 3-6 X per week   Sexual activity: Yes  Other Topics Concern   Not on file  Social History Narrative   Patient lives alone in [place].    Patient has 3 children, grandchildren, and 1 great grandchild.    Patient is a retired Investment banker, operational. Patient enjoys cooking and enjoying his food.    Patient walks for exercise when the weather is nice, admits he could do more.    Social Determinants of Health   Financial Resource Strain: Low Risk  (11/12/2022)   Overall Financial Resource Strain (CARDIA)    Difficulty of Paying Living Expenses: Not hard at all  Food Insecurity: No Food Insecurity (11/12/2022)   Hunger Vital Sign    Worried About Running Out of Food in the Last Year: Never true    Ran Out of Food in the Last Year: Never true  Transportation Needs: No Transportation Needs (11/12/2022)   PRAPARE - Administrator, Civil Service (Medical): No    Lack of Transportation (Non-Medical): No  Physical Activity: Insufficiently Active (11/12/2022)   Exercise Vital Sign    Days of Exercise per Week: 3 days    Minutes of Exercise per Session: 30 min  Stress: No Stress Concern Present (11/12/2022)   [employer] of Occupational Health - Occupational Stress Questionnaire    Feeling of Stress : Not at all  Social Connections: Socially Isolated (11/12/2022)   Social Connection and Isolation Panel [NHANES]    Frequency of Communication with Friends and Family: Three times a week    Frequency of Social Gatherings with Friends and Family: Once a week    Attends Religious Services: Never    Database administrator or Organizations: No     Attends Banker Meetings: Never    Marital Status: Widowed  Intimate Partner Violence: Not At Risk (11/12/2022)   Humiliation, Afraid, Rape, and Kick questionnaire    Fear of Current or Ex-Partner: No    Emotionally Abused: No    Physically Abused: No    Sexually Abused: No    Family History  Problem Relation Age of Onset   Heart attack Mother    Diabetes Mother    Diabetes Sister    Diabetes Maternal Grandmother    Heart attack Maternal Grandmother    Diabetes Maternal Aunt    Colon cancer Neg Hx    Colon polyps Neg Hx    Esophageal cancer Neg Hx  Rectal cancer Neg Hx    Stomach cancer Neg Hx     Review of Systems:  As stated in the HPI and otherwise negative.   There were no vitals taken for this visit.  Physical Examination: General: Well developed, well nourished, NAD  HEENT: OP clear, mucus membranes moist  SKIN: warm, dry. No rashes. Neuro: No focal deficits  Musculoskeletal: Muscle strength 5/5 all ext  Psychiatric: Mood and affect normal  Neck: No JVD, no carotid bruits, no thyromegaly, no lymphadenopathy.  Lungs:Clear bilaterally, no wheezes, rhonci, crackles Cardiovascular: Regular rate and rhythm. No murmurs, gallops or rubs. Abdomen:Soft. Bowel sounds present. Non-tender.  Extremities: No lower extremity edema. Pulses are 2 + in the bilateral DP/PT.  EKG:  EKG {ACTION; IS/IS [id number]} ordered today. The ekg ordered today demonstrates ***  Recent Labs: 08/23/2022: ALT 19; BUN 10; Creatinine, Ser 1.32; Hemoglobin 12.1; Platelets 323; Potassium 4.1; Sodium 137   Lipid Panel    Component Value Date/Time   CHOL 119 12/11/2021 1100   TRIG 81 12/11/2021 1100   HDL 66 12/11/2021 1100   CHOLHDL 1.8 12/11/2021 1100   CHOLHDL 5.9 (H) 11/01/2014 1026   VLDL 23 11/01/2014 1026   LDLCALC 37 12/11/2021 1100   LDLDIRECT 84 09/03/2011 0930     Wt Readings from Last 3 Encounters:  11/06/22 71.7 kg  09/24/22 74 kg  08/23/22 76.2 kg     Assessment and Plan:   1. CAD without angina: He has no chest pain. Continue ASA, beta blocker and statin  2. HTN: BP is controlled. No changes today  3. Hyperlipidemia: LDL at goal in 2023. Continue statin  4. Aortic valve insufficiency: Mild by outside echo in September 2024.   Labs/ tests ordered today include:  No orders of the defined types were placed in this encounter.  Disposition:   F/U with me in one year   Signed, Verne Carrow, MD, [facility] 12/16/2022 2:08 PM    [facility] [address] [place], [place], [place]  [address] Phone: [phone]; Fax: [phone]

## 2022-12-17 ENCOUNTER — Ambulatory Visit: Payer: Medicare Other | Attending: Cardiovascular Disease | Admitting: Cardiovascular Disease

## 2022-12-17 ENCOUNTER — Ambulatory Visit (INDEPENDENT_AMBULATORY_CARE_PROVIDER_SITE_OTHER): Payer: Medicare Other

## 2022-12-17 ENCOUNTER — Encounter: Payer: Self-pay | Admitting: Cardiovascular Disease

## 2022-12-17 VITALS — BP 110/68 | HR 68 | Ht 71.0 in | Wt 164.0 lb

## 2022-12-17 DIAGNOSIS — R55 Syncope and collapse: Secondary | ICD-10-CM | POA: Diagnosis not present

## 2022-12-17 DIAGNOSIS — I2510 Atherosclerotic heart disease of native coronary artery without angina pectoris: Secondary | ICD-10-CM | POA: Diagnosis not present

## 2022-12-17 DIAGNOSIS — E782 Mixed hyperlipidemia: Secondary | ICD-10-CM | POA: Diagnosis not present

## 2022-12-17 DIAGNOSIS — I351 Nonrheumatic aortic (valve) insufficiency: Secondary | ICD-10-CM

## 2022-12-17 DIAGNOSIS — I10 Essential (primary) hypertension: Secondary | ICD-10-CM

## 2022-12-17 NOTE — Patient Instructions (Addendum)
Medication Instructions:  No changes *If you need a refill on your cardiac medications before your next appointment, please call your pharmacy*   Lab Work: none If you have labs (blood work) drawn today and your tests are completely normal, you will receive your results only by: MyChart Message (if you have MyChart) OR A paper copy in the mail If you have any lab test that is abnormal or we need to change your treatment, we will call you to review the results.   Testing/Procedures: 14 day Zio heart patch   Follow-Up: At [facility], you and your health needs are our priority.  As part of our continuing mission to provide you with exceptional heart care, we have created designated Provider Care Teams.  These Care Teams include your primary Cardiologist (physician) and Advanced Practice Providers (APPs -  Physician Assistants and Nurse Practitioners) who all work together to provide you with the care you need, when you need it.  We recommend signing up for the patient portal called "MyChart".  Sign up information is provided on this After Visit Summary.  MyChart is used to connect with patients for Virtual Visits (Telemedicine).  Patients are able to view lab/test results, encounter notes, upcoming appointments, etc.  Non-urgent messages can be sent to your provider as well.   To learn more about what you can do with MyChart, go to [email or web address].    Your next appointment:   12 month(s)  Provider:   Verne Carrow, MD     Christena Deem- Long Term Monitor Instructions  Your physician has requested you wear a ZIO patch monitor for 14 days.  This is a single patch monitor. [employer] supplies one patch monitor per enrollment. Additional stickers are not available. Please do not apply patch if you will be having a Nuclear Stress Test,  Echocardiogram, Cardiac CT, MRI, or Chest Xray during the period you would be wearing the  monitor. The patch cannot be worn during these  tests. You cannot remove and re-apply the  ZIO XT patch monitor.  Your ZIO patch monitor will be mailed 3 day USPS to your address on file. It may take 3-5 days  to receive your monitor after you have been enrolled.  Once you have received your monitor, please review the enclosed instructions. Your monitor  has already been registered assigning a specific monitor serial # to you.  Billing and Patient Assistance Program Information  We have supplied [employer] with any of your insurance information on file for billing purposes. [employer] offers a sliding scale Patient Assistance Program for patients that do not have  insurance, or whose insurance does not completely cover the cost of the ZIO monitor.  You must apply for the Patient Assistance Program to qualify for this discounted rate.  To apply, please call [employer] at [phone], select option 4, select option 2, ask to apply for  Patient Assistance Program. Meredeth Ide will ask your household income, and how many people  are in your household. They will quote your out-of-pocket cost based on that information.  [employer] will also be able to set up a 43-month, interest-free payment plan if needed.  Applying the monitor   Shave hair from upper left chest.  Hold abrader disc by orange tab. Rub abrader in 40 strokes over the upper left chest as  indicated in your monitor instructions.  Clean area with 4 enclosed alcohol pads. Let dry.  Apply patch as indicated in monitor instructions. Patch will be placed under collarbone on  left  side of chest with arrow pointing upward.  Rub patch adhesive wings for 2 minutes. Remove white label marked "1". Remove the white  label marked "2". Rub patch adhesive wings for 2 additional minutes.  While looking in a mirror, press and release button in center of patch. A small green light will  flash 3-4 times. This will be your only indicator that the monitor has been turned on.  Do not shower for the first 24  hours. You may shower after the first 24 hours.  Press the button if you feel a symptom. You will hear a small click. Record Date, Time and  Symptom in the Patient Logbook.  When you are ready to remove the patch, follow instructions on the last 2 pages of Patient  Logbook. Stick patch monitor onto the last page of Patient Logbook.  Place Patient Logbook in the blue and white box. Use locking tab on box and tape box closed  securely. The blue and white box has prepaid postage on it. Please place it in the mailbox as  soon as possible. Your physician should have your test results approximately 7 days after the  monitor has been mailed back to [facility].  Call [facility] Customer Care at [phone] if you have questions regarding  your ZIO XT patch monitor. Call them immediately if you see an orange light blinking on your  monitor.  If your monitor falls off in less than 4 days, contact our Monitor department at [phone].  If your monitor becomes loose or falls off after 4 days call [employer] at [phone] for  suggestions on securing your monitor

## 2022-12-17 NOTE — Progress Notes (Unsigned)
ZIO XT serial # [id number] from office inventory applied to patient.

## 2023-01-07 DIAGNOSIS — R55 Syncope and collapse: Secondary | ICD-10-CM | POA: Diagnosis not present

## 2023-01-10 ENCOUNTER — Encounter: Payer: Self-pay | Admitting: Student

## 2023-02-06 ENCOUNTER — Ambulatory Visit (INDEPENDENT_AMBULATORY_CARE_PROVIDER_SITE_OTHER): Payer: Medicare Other | Admitting: Family Medicine

## 2023-02-06 VITALS — BP 117/80 | HR 63 | Ht 71.0 in | Wt 166.6 lb

## 2023-02-06 DIAGNOSIS — M545 Low back pain, unspecified: Secondary | ICD-10-CM

## 2023-02-06 DIAGNOSIS — E782 Mixed hyperlipidemia: Secondary | ICD-10-CM

## 2023-02-06 DIAGNOSIS — Z0000 Encounter for general adult medical examination without abnormal findings: Secondary | ICD-10-CM | POA: Diagnosis not present

## 2023-02-06 DIAGNOSIS — E119 Type 2 diabetes mellitus without complications: Secondary | ICD-10-CM

## 2023-02-06 LAB — POCT GLYCOSYLATED HEMOGLOBIN (HGB A1C): HbA1c, POC (controlled diabetic range): 6 % (ref 0.0–7.0)

## 2023-02-06 MED ORDER — BACLOFEN 10 MG PO TABS: 5.0000 mg | ORAL_TABLET | Freq: Two times a day (BID) | ORAL | 0 refills | Status: DC | PRN

## 2023-02-06 NOTE — Patient Instructions (Signed)
It was great to see you again today.  Go get xray Sent in baclofen for pain  Try to stay active  Updating labwork today   Follow up in 6 months, sooner if needed  Be well, Dr. Pollie Meyer  Lumbar Sprain A lumbar sprain, which is sometimes called a low-back sprain, is a stretch or tear in the ligaments in the lower back (lumbar spine). Ligaments are the bands of tissue that connect bones to each other. This type of injury occurs when you stretch the ligaments beyond their limits. Lumbar sprains can range from mild to severe. Mild sprains may involve stretching a ligament without tearing it. These may heal in 1-2 weeks. More severe sprains involve tearing of the ligament. These will cause more pain and may take 6-8 weeks to heal. What are the causes? This condition may be caused by: Trauma, such as a fall or a hit to the body. Twisting or overstretching the back. This may result from doing activities that take a lot of energy, such as lifting heavy objects. What increases the risk? A lumbar sprain is more common in: Athletes. People with obesity. People who do repeated lifting, bending, or other movements that involve their back. What are the signs or symptoms? Symptoms of this condition may include: Sharp or dull pain in the lower back that does not go away. The pain may spread to the buttocks. Stiffness or limited range of motion. Sudden muscle tightening (spasms). How is this diagnosed? This condition may be diagnosed based on: Your symptoms. Your medical history. A physical exam. Imaging tests, such as: X-rays. MRI. How is this treated? Treatment for this condition may include: Resting the injured area. Applying heat and cold to the affected area. Over-the-counter medicines for pain and inflammation, such as NSAIDs. Prescription medicine for pain or to relax the muscles. These may be needed for a short time. Physical therapy exercises to improve movement and  strength. Follow these instructions at home: Managing pain, stiffness, and swelling     If told, put ice on the injured area during the first 24 hours after your injury. Put ice in a plastic bag. Place a towel between your skin and the bag. Leave the ice on for 20 minutes, 2-3 times a day. If told, apply heat to the affected area as often as told by your health care provider. Use the heat source that your health care provider recommends, such as a moist heat pack or a heating pad. Place a towel between your skin and the heat source. Leave the heat on for 20-30 minutes. If your skin turns bright red, remove the ice or heat right away to prevent skin damage. The risk of damage is higher if you cannot feel pain, heat, or cold. Activity Rest and return to your normal activities as told by your health care provider. Ask your health care provider what activities are safe for you. Do exercises as told by your health care provider. General instructions Take over-the-counter and prescription medicines only as told by your health care provider. Ask your health care provider if the medicine prescribed to you: Requires you to avoid driving or using machinery. Can cause constipation. You may need to take these actions to prevent or treat constipation: Drink enough fluid to keep your urine pale yellow. Take over-the-counter or prescription medicines. Eat foods that are high in fiber, such as beans, whole grains, and fresh fruits and vegetables. Limit foods that are high in fat and processed sugars, such as  fried or sweet foods. Do not use any products that contain nicotine or tobacco. These products include cigarettes, chewing tobacco, and vaping devices, such as e-cigarettes. If you need help quitting, ask your health care provider. How is this prevented? To prevent a future low-back injury: Always warm up properly before physical activity or sports. Cool down and stretch after being active. Use  correct form when playing sports and lifting heavy objects. Bend your knees before you lift heavy objects. Use good posture when sitting and standing. Stay physically fit and keep a healthy weight. Do at least 150 minutes of moderate-intensity exercise each week, such as brisk walking or water aerobics. Do strength exercises at least 2 times each week. Contact a health care provider if: Your back pain does not improve after several weeks of treatment. Your symptoms get worse. You have a fever. Get help right away if: Your back pain is severe. You are unable to stand or walk. You develop pain in your legs. You have weakness in your buttocks or legs. You have trouble controlling when you urinate or when you have a bowel movement. You have frequent, painful, or bloody urination. This information is not intended to replace advice given to you by your health care provider. Make sure you discuss any questions you have with your health care provider. Document Revised: 09/04/2021 Document Reviewed: 09/04/2021 Elsevier Patient Education  2024 [employer].

## 2023-02-06 NOTE — Progress Notes (Signed)
   Date of Visit: 02/06/2023   SUBJECTIVE:   HPI:  Adam Burton presents today for back pain and follow up.  Back pain: About a week ago he tweaked his back.  It was okay that day, but then a day or 2 later he started to have significant pain in the left lower part of his back.  Has been wearing a back brace.  Also sleeping on a heating pad.  Tried Tylenol which did not help very much. Denies having fever, saddle anesthesia, lower extremity weakness, or problems with stooling or urination. No radiation of pain down his legs.  Hypertension: Currently taking amlodipine 10 mg daily and metoprolol XL 50 mg daily.  Diabetes: Currently taking metformin XR 500 mg daily.  Hyperlipidemia: Currently taking Crestor 20 mg daily  GERD: Currently taking Nexium 40 mg daily and Pepcid 20 mg twice daily.   OBJECTIVE:   BP 117/80   Pulse 63   Ht 5\' 11"  (1.803 m)   Wt 166 lb 9.6 oz (75.6 kg)   SpO2 100%   BMI 23.24 kg/m  Gen: no acute distress, pleasant, cooperative well appearing HEENT: normocephalic, atraumatic  Heart: regular rate and rhythm, no murmur Lungs: clear to auscultation bilaterally, normal work of breathing  Neuro: alert, speech normal, grossly nonfocal Back: tender to palpation of lumbar spine midline as well as L paraspinal muscles. Full strength bilateral lower extremities   ASSESSMENT/PLAN:   Assessment & Plan Lumbar back pain Suspect muscle strain, will rx trial of muscle relaxer baclofen No weakness or red flags Given midline tenderness check xray, ordered Given handout with exercises Type 2 diabetes mellitus without complication, without long-term current use of insulin (HCC) Update A1c today Continue metformin UACR today Routine adult health maintenance UACR today Declines flu and COVID vaccines today Mixed hyperlipidemia Lipid panel today Continue statin    FOLLOW UP: Follow up in 6 mos for above issues, sooner if back pain not improving  [place] J.  Pollie Meyer, MD [facility]

## 2023-02-07 ENCOUNTER — Encounter: Payer: Self-pay | Admitting: Family Medicine

## 2023-02-07 LAB — BASIC METABOLIC PANEL
BUN/Creatinine Ratio: 9 — ABNORMAL LOW (ref 10–24)
BUN: 10 mg/dL (ref 8–27)
CO2: 24 mmol/L (ref 20–29)
Calcium: 9.6 mg/dL (ref 8.6–10.2)
Chloride: 101 mmol/L (ref 96–106)
Creatinine, Ser: 1.17 mg/dL (ref 0.76–1.27)
Glucose: 92 mg/dL (ref 70–99)
Potassium: 4.8 mmol/L (ref 3.5–5.2)
Sodium: 139 mmol/L (ref 134–144)
eGFR: 65 mL/min/{1.73_m2} (ref >59)

## 2023-02-07 LAB — MICROALBUMIN / CREATININE URINE RATIO
Creatinine, Urine: 145.5 mg/dL
Microalb/Creat Ratio: 19 mg/g{creat} (ref 0–29)
Microalbumin, Urine: 28.1 ug/mL

## 2023-02-07 LAB — LIPID PANEL
Chol/HDL Ratio: 2 {ratio} (ref 0.0–5.0)
Cholesterol, Total: 163 mg/dL (ref 100–199)
HDL: 80 mg/dL (ref >39)
LDL Chol Calc (NIH): 64 mg/dL (ref 0–99)
Triglycerides: 111 mg/dL (ref 0–149)
VLDL Cholesterol Cal: 19 mg/dL (ref 5–40)

## 2023-02-08 DIAGNOSIS — Z0000 Encounter for general adult medical examination without abnormal findings: Secondary | ICD-10-CM | POA: Insufficient documentation

## 2023-02-08 NOTE — Assessment & Plan Note (Signed)
Update A1c today Continue metformin UACR today

## 2023-02-08 NOTE — Assessment & Plan Note (Signed)
Lipid panel today.  Continue statin. 

## 2023-02-08 NOTE — Assessment & Plan Note (Signed)
UACR today Declines flu and COVID vaccines today

## 2023-02-20 ENCOUNTER — Other Ambulatory Visit: Payer: Self-pay

## 2023-02-20 MED ORDER — ESOMEPRAZOLE MAGNESIUM 40 MG PO CPDR: 40.0000 mg | DELAYED_RELEASE_CAPSULE | Freq: Every day | ORAL | 0 refills | Status: AC

## 2023-02-20 NOTE — Telephone Encounter (Signed)
Pt's pharmacy is requesting a refill on esomeprazole (Nexium).; would Dr. Clifton James like to refill this mediation? Please address

## 2023-03-10 ENCOUNTER — Other Ambulatory Visit: Payer: Self-pay

## 2023-03-10 DIAGNOSIS — I10 Essential (primary) hypertension: Secondary | ICD-10-CM

## 2023-03-10 MED ORDER — AMLODIPINE BESYLATE 10 MG PO TABS
10.0000 mg | ORAL_TABLET | Freq: Every day | ORAL | 2 refills | Status: DC
Start: 2023-03-10 — End: 2023-08-06

## 2023-03-21 ENCOUNTER — Other Ambulatory Visit: Payer: Self-pay

## 2023-03-21 DIAGNOSIS — I10 Essential (primary) hypertension: Secondary | ICD-10-CM

## 2023-03-21 MED ORDER — METOPROLOL SUCCINATE ER 50 MG PO TB24
50.0000 mg | ORAL_TABLET | Freq: Every day | ORAL | 2 refills | Status: DC
Start: 2023-03-21 — End: 2023-08-06

## 2023-04-15 ENCOUNTER — Other Ambulatory Visit: Payer: Self-pay

## 2023-04-15 DIAGNOSIS — I2510 Atherosclerotic heart disease of native coronary artery without angina pectoris: Secondary | ICD-10-CM

## 2023-04-15 MED ORDER — ROSUVASTATIN CALCIUM 20 MG PO TABS
20.0000 mg | ORAL_TABLET | Freq: Every day | ORAL | 2 refills | Status: DC
Start: 2023-04-15 — End: 2023-11-10

## 2023-06-19 ENCOUNTER — Ambulatory Visit

## 2023-06-19 VITALS — BP 126/80 | HR 57 | Ht 71.0 in | Wt 160.2 lb

## 2023-06-19 DIAGNOSIS — R42 Dizziness and giddiness: Secondary | ICD-10-CM | POA: Diagnosis not present

## 2023-06-19 LAB — POCT CBG (FASTING - GLUCOSE)-MANUAL ENTRY: Glucose Fasting, POC: 92 mg/dL (ref 70–99)

## 2023-06-19 NOTE — Patient Instructions (Signed)
 It was great to see you today! Thank you for choosing [facility] for your primary care.  Today we addressed: EKG and orthostatics were normal.  Your glucose was at an appropriate level.  Lets get some labs and should this persist, I recommend returning for further discussion in 3 to 4 weeks.  If you haven't already, sign up for My Chart to have easy access to your labs results, and communication with your primary care physician. We are checking some labs today. If they are abnormal, I will call you. If they are normal, I will send you a MyChart message (if it is active) or a letter in the mail. If you do not hear about your labs in the next 2 weeks, please call the office. Return in about 4 weeks (around 07/17/2023) for Dizziness follow-up. Please arrive 15 minutes before your appointment to ensure smooth check in process.  We appreciate your efforts in making this happen.  Thank you for allowing me to participate in your care, Veronia Goon, DO 06/19/2023, 9:14 AM PGY-3, [facility]

## 2023-06-19 NOTE — Progress Notes (Signed)
  SUBJECTIVE:   CHIEF COMPLAINT / HPI:   Dizziness: Presents with lightheadedness and slight nausea upon waking in the morning. This has been occurring occasionally for the past few weeks, approximately every other day (3 mornings in the last 7 days). The symptoms last for about five to six minutes. The patient denies any associated palpitations, shortness of breath, or changes in vision or hearing.  During his nightly trips to the bathroom, he does not have these symptoms.  He infrequently takes naps and does not report the symptoms to them as well.  He patient is scheduled to travel to [facility] next month and wanted to discuss these symptoms before his trip.  He is adherent to his medications and has not checked his blood pressure nor glucose when this happens.  The patient also reports a history of a fall last year in which he hit his face on a concrete pavement.  He has not had any syncopal episodes since then.  PERTINENT  PMH / PSH: HTN, HLD, T2DM, BPH, CAD, atrial septum aneurysm  OBJECTIVE:  BP 126/80   Pulse (!) 57   Ht 5\' 11"  (1.803 m)   Wt 160 lb 3.2 oz (72.7 kg)   SpO2 100%   BMI 22.34 kg/m  General: Well-appearing, NAD CV: RRR, no murmurs appreciable Pulm: CTAB, normal WOB Neuro: CN II through XII intact, no focal neurological deficits appreciable, normal speech  ASSESSMENT/PLAN:   Assessment & Plan Dizziness Reviewed most recent labs which are reassuring, does have anemia of hemoglobin 12.4 on recent CBC approximately 1 year ago.  Reviewed recent cardiology notation and echo which is not greatly remarkable that would explain symptomatology in terms of a cardiac nature.  Additionally he had 14-day heart monitor in November with rare PVCs and generally NSR.  He is not hypotensive at this time, considered orthostatic hypotension which was unremarkable.  Hypoglycemia ruled out with fasting CBG.  EKG revealed sinus bradycardia with T wave abnormality similar to prior EKGs not  concerning for recent cardiac changes.  Will obtain labs and should this persist or worsen recommend considering returning for further discussion of heart monitor and/or follow-up with cardiology. Return in about 4 weeks (around 07/17/2023) for Dizziness follow-up. Veronia Goon, DO 06/19/2023, 9:14 AM PGY-3, [facility]

## 2023-06-20 LAB — CBC
Hematocrit: 41.5 % (ref 37.5–51.0)
Hemoglobin: 13.9 g/dL (ref 13.0–17.7)
MCH: 31.4 pg (ref 26.6–33.0)
MCHC: 33.5 g/dL (ref 31.5–35.7)
MCV: 94 fL (ref 79–97)
Platelets: 359 10*3/uL (ref 150–450)
RBC: 4.43 x10E6/uL (ref 4.14–5.80)
RDW: 13 % (ref 11.6–15.4)
WBC: 6.6 10*3/uL (ref 3.4–10.8)

## 2023-06-20 LAB — TSH: TSH: 0.808 u[IU]/mL (ref 0.450–4.500)

## 2023-06-20 LAB — COMPREHENSIVE METABOLIC PANEL WITH GFR
ALT: 15 IU/L (ref 0–44)
AST: 17 IU/L (ref 0–40)
Albumin: 4.8 g/dL (ref 3.8–4.8)
Alkaline Phosphatase: 78 IU/L (ref 44–121)
BUN/Creatinine Ratio: 9 — ABNORMAL LOW (ref 10–24)
BUN: 10 mg/dL (ref 8–27)
Bilirubin Total: 0.9 mg/dL (ref 0.0–1.2)
CO2: 23 mmol/L (ref 20–29)
Calcium: 10 mg/dL (ref 8.6–10.2)
Chloride: 100 mmol/L (ref 96–106)
Creatinine, Ser: 1.12 mg/dL (ref 0.76–1.27)
Globulin, Total: 2.9 g/dL (ref 1.5–4.5)
Glucose: 95 mg/dL (ref 70–99)
Potassium: 4.9 mmol/L (ref 3.5–5.2)
Sodium: 138 mmol/L (ref 134–144)
Total Protein: 7.7 g/dL (ref 6.0–8.5)
eGFR: 69 mL/min/{1.73_m2} (ref >59)

## 2023-06-24 ENCOUNTER — Encounter: Payer: Self-pay | Admitting: Student

## 2023-07-22 ENCOUNTER — Ambulatory Visit: Admitting: Family Medicine

## 2023-08-04 ENCOUNTER — Other Ambulatory Visit: Payer: Self-pay | Admitting: Cardiovascular Disease

## 2023-08-04 ENCOUNTER — Other Ambulatory Visit: Payer: Self-pay | Admitting: Family Medicine

## 2023-08-04 DIAGNOSIS — I10 Essential (primary) hypertension: Secondary | ICD-10-CM

## 2023-09-02 ENCOUNTER — Other Ambulatory Visit: Payer: Self-pay | Admitting: Cardiovascular Disease

## 2023-09-02 DIAGNOSIS — I2510 Atherosclerotic heart disease of native coronary artery without angina pectoris: Secondary | ICD-10-CM

## 2023-10-23 ENCOUNTER — Ambulatory Visit: Payer: Self-pay | Admitting: Family Medicine

## 2023-10-23 ENCOUNTER — Ambulatory Visit (INDEPENDENT_AMBULATORY_CARE_PROVIDER_SITE_OTHER): Admitting: Family Medicine

## 2023-10-23 ENCOUNTER — Encounter: Payer: Self-pay | Admitting: Pharmacist

## 2023-10-23 ENCOUNTER — Ambulatory Visit (HOSPITAL_COMMUNITY)
Admission: RE | Admit: 2023-10-23 | Discharge: 2023-10-23 | Disposition: A | Discharge status: Home / Self Care | Source: Ambulatory Visit | Attending: Family Medicine | Admitting: Family Medicine

## 2023-10-23 ENCOUNTER — Ambulatory Visit (INDEPENDENT_AMBULATORY_CARE_PROVIDER_SITE_OTHER): Admitting: Pharmacist

## 2023-10-23 ENCOUNTER — Encounter: Payer: Self-pay | Admitting: Family Medicine

## 2023-10-23 VITALS — BP 137/87 | HR 66 | Ht 71.0 in | Wt 168.8 lb

## 2023-10-23 VITALS — Ht 70.0 in | Wt 168.0 lb

## 2023-10-23 DIAGNOSIS — E119 Type 2 diabetes mellitus without complications: Secondary | ICD-10-CM

## 2023-10-23 DIAGNOSIS — R0609 Other forms of dyspnea: Secondary | ICD-10-CM

## 2023-10-23 DIAGNOSIS — R0789 Other chest pain: Secondary | ICD-10-CM

## 2023-10-23 DIAGNOSIS — R0602 Shortness of breath: Secondary | ICD-10-CM | POA: Diagnosis not present

## 2023-10-23 LAB — POCT GLYCOSYLATED HEMOGLOBIN (HGB A1C): HbA1c, POC (controlled diabetic range): 6 % (ref 0.0–7.0)

## 2023-10-23 MED ORDER — FAMOTIDINE 20 MG PO TABS: 20.0000 mg | ORAL_TABLET | Freq: Two times a day (BID) | ORAL | 1 refills | Status: AC

## 2023-10-23 NOTE — Patient Instructions (Signed)
 It was great to see you again today.  Doing lung function testing w/ Dr. Koval today EKG looks good Start pepcid  for acid reflux A1c today  Follow up in 3 months, sooner if needed  Be well, Dr. Donah

## 2023-10-23 NOTE — Progress Notes (Signed)
  Date of Visit: 10/23/2023   SUBJECTIVE:   HPI:  Discussed the use of AI scribe software for clinical note transcription with the patient, who gave verbal consent to proceed.  History of Present Illness Adam Burton is a 73 year old male with coronary artery disease who presents with chest tightness when consuming cold drinks or alcohol.  Chest tightness and dyspnea - Chest tightness on both sides of anterior chest lasting 4-5 minutes, triggered by consumption of cold drinks or alcohol, resolving spontaneously - No chest tightness with spicy foods - Alcohol consumption approximately every other day, with chest tightness only on those days after drinking - No chest pain or tightness with walking - Shortness of breath with walking or climbing stairs for the past couple of months - No chronic cough  Unintentional weight loss - Recent concern about weight loss, prompting this appointment - Uncertain if weight loss is ongoing or has stabilized  Lower extremity paresthesia and discomfort - Burning sensation from ankle up the back of the L calf to the knee when sleeping with one foot crossed - Discomfort eases with position change but does not completely resolve - Ankle band provides symptomatic relief  Substance use and physical activity - Daily marijuana use - Alcohol consumption approximately every other day - No cigarette use - Works part-time for exercise  History of fall and travel concerns - Previous fall during a trip to [place]  resulting in facial bruising - Planning another trip and wants to ensure health is stable before traveling   Nuclear stress test: No ischemia (2022)  Echocardiogram: Normal systolic function (10/2022)  Zio patch: Normal (12/2022)   OBJECTIVE:   BP 137/87   Pulse 66   Ht 5' 11 (1.803 m)   Wt 168 lb 12.8 oz (76.6 kg)   SpO2 100%   BMI 23.54 kg/m  Gen: no acute distress, pleasant, cooperative HEENT: normocephalic, atraumatic  Heart:  regular rate and rhythm, no murmur Lungs: clear to auscultation bilaterally, normal work of breathing  Neuro: alert grossly nonfocal speech normal Ext: No appreciable lower extremity edema bilaterally   ASSESSMENT/PLAN:   Assessment & Plan Chest tightness Chest tightness with cold drinks or alcohol (likely gastroesophageal reflux disease) Intermittent chest tightness provoked entirely and exclusively by drinking beverages likely due to GERD, not cardiac in origin. EKG unchanged, supporting non-cardiac etiology. - Prescribe Pepcid  (famotidine ) for GERD, continue PPI - Advise avoidance of alcohol and cold drinks. Shortness of breath Patient admits to exertional shortness of breath upon ROS, was not chief complaint of today's visit. Possible COPD due to marijuana smoking. Prior cardiac workup largely unremarkable recently. EKG unchanged today. Pulmonary exam normal. - Scheduled for PFTs today with Dr. Amalia Type 2 diabetes mellitus without complication, without long-term current use of insulin (HCC) Type 2 diabetes managed with metformin . A1c needs updating. - Check A1c level before leaving clinic. - advised to schedule eye exam  Paresthesia Recommend continuing ankle brace as it is helping Follow up if not improving or worsening  Weight concerns Weight stable to increased compared to prior visits, reassuring. Continue to monitor.    FOLLOW UP: Follow up in 3 mos for above issues PFTs today with Dr. Koval  Ivori Storr J. Donah, MD [facility]

## 2023-10-23 NOTE — Progress Notes (Signed)
   S:     Chief Complaint  Patient presents with   Medication Management    PFTs   73 y.o. male who presents for diabetes evaluation, education, and management. Patient arrives in good spirits and presents without any assistance.  Patient was referred and last seen by Primary Care Provider, Dr. Donah, today 10/23/23.  Reports chest tightness when consuming alcohol, reports drinking a shot every day.   PMH is significant for CAD stent intervention 2020, T2DM, BPH, hypertension, GERD, dyslipidemia.  Reports struggling to breath when walking up ~13 stairs or when walking and talking on the phone. Endorses noticing this over the last few months.  Reports smoking marijuana once a day after around 4 times per week.  Reports used to roll marijuana with tobacco for ~5 years but stopped after he had stent placed in 2020. Rolls marijuana in paper, no tobacco. Started smoking marijuana at age 22. Endorses used to smoke more in youth.  O: Review of Systems  Respiratory:  Positive for sputum production (minimal and only AM) and shortness of breath (with walking stairs).   All other systems reviewed and are negative.  Physical Exam Constitutional:      Appearance: Normal appearance. He is normal weight.  Pulmonary:     Effort: Pulmonary effort is normal.  Neurological:     Mental Status: He is alert.  Psychiatric:        Mood and Affect: Mood normal.        Behavior: Behavior normal.        Thought Content: Thought content normal.        Judgment: Judgment normal.   MMRC score= <2 CAT = 3 See Documentation Flowsheet - CAT/COPD for complete symptom scoring.  See scanned report or Documentation Flowsheet (discrete results - PFTs) for Spirometry results. Patient provided good effort while attempting spirometry.   Lung Age = 89 Albuterol Neb  Lot# [id number]     Exp. 06/2024   A/P: Patient has been experiencing shortness of breath on exertion and chest tightness when drinking alcohol  for the past few months. Reports has never been told he has asthma or COPD, has never used an inhaler. Spirometry evaluation with pre- and post-bronchodilator reveals reduced but relatively normal lung function.  Reduced FEV1 and FVC related to narrow/tall body frame.  -Reviewed results of pulmonary function tests.  Pt verbalized understanding of results and education.     Written patient instructions provided.   Total time in face to face counseling 26 minutes.    Follow-up:  Pharmacist PRN PCP clinic visit 12/11/23 ([facility])  Patient seen with Fonda Blase, PharmD Candidate - PY3 student and Calton Nash, PharmD Candidate - PY4 student.

## 2023-10-23 NOTE — Progress Notes (Signed)
 Reviewed and agree with Dr Rennis plan.

## 2023-10-23 NOTE — Assessment & Plan Note (Signed)
 Patient has been experiencing shortness of breath on exertion and chest tightness when drinking alcohol for the past few months. Reports has never been told he has asthma or COPD, has never used an inhaler. Spirometry evaluation with pre- and post-bronchodilator reveals reduced but relatively normal lung function.  Reduced FEV1 and FVC related to narrow/tall body frame.

## 2023-10-23 NOTE — Patient Instructions (Addendum)
 It was nice to see you today! Thanks for taking your lung function test, there was very little change pre- and post- albuterol medication.   Your A1c today was 6.0% Keep up the great work!  Medication Changes: -Continue all other medication the same.  Monitor blood sugars at home and keep a log (glucometer or piece of paper) to bring with you to your next visit.  Keep up the good work with diet and exercise. Aim for a diet full of vegetables, fruit and lean meats (chicken, [place], fish). Try to limit salt intake by eating fresh or frozen vegetables (instead of canned), rinse canned vegetables prior to cooking and do not add any additional salt to meals.

## 2023-10-25 NOTE — Assessment & Plan Note (Signed)
 Type 2 diabetes managed with metformin . A1c needs updating. - Check A1c level before leaving clinic. - advised to schedule eye exam

## 2023-11-09 ENCOUNTER — Other Ambulatory Visit: Payer: Self-pay | Admitting: Cardiovascular Disease

## 2023-11-09 DIAGNOSIS — I2510 Atherosclerotic heart disease of native coronary artery without angina pectoris: Secondary | ICD-10-CM

## 2023-12-11 ENCOUNTER — Ambulatory Visit

## 2023-12-11 VITALS — Ht 70.0 in | Wt 155.0 lb

## 2023-12-11 DIAGNOSIS — Z0000 Encounter for general adult medical examination without abnormal findings: Secondary | ICD-10-CM

## 2023-12-11 NOTE — Patient Instructions (Signed)
 Adam Burton,  Thank you for taking the time for your Medicare Wellness Visit. I appreciate your continued commitment to your health goals. Please review the care plan we discussed, and feel free to reach out if I can assist you further.  Medicare recommends these wellness visits once per year to help you and your care team stay ahead of potential health issues. These visits are designed to focus on prevention, allowing your provider to concentrate on managing your acute and chronic conditions during your regular appointments.  Please note that Annual Wellness Visits do not include a physical exam. Some assessments may be limited, especially if the visit was conducted virtually. If needed, we may recommend a separate in-person follow-up with your provider.  Ongoing Care Seeing your primary care provider every 3 to 6 months helps us  monitor your health and provide consistent, personalized care.   Referrals If a referral was made during today's visit and you haven't received any updates within two weeks, please contact the referred provider directly to check on the status.  Recommended Screenings:  Health Maintenance  Topic Date Due   Zoster (Shingles) Vaccine (2 of 2) 12/03/2020   Eye exam for diabetics  08/16/2023   Complete foot exam   09/24/2023   Flu Shot  09/26/2023   COVID-19 Vaccine (3 - 2025-26 season) 10/27/2023   Yearly kidney health urinalysis for diabetes  02/06/2024   Hemoglobin A1C  04/24/2024   Yearly kidney function blood test for diabetes  06/18/2024   Medicare Annual Wellness Visit  12/10/2024   Pneumococcal Vaccine for age over 37  Completed   Hepatitis C Screening  Completed   Meningitis B Vaccine  Aged Out   DTaP/Tdap/Td vaccine  Discontinued   Colon Cancer Screening  Discontinued       12/11/2023    9:15 AM  Advanced Directives  Does Patient Have a Medical Advance Directive? No  Would patient like information on creating a medical advance directive? No -  Patient declined   Advance Care Planning is important because it: Ensures you receive medical care that aligns with your values, goals, and preferences. Provides guidance to your family and loved ones, reducing the emotional burden of decision-making during critical moments.  Vision: Annual vision screenings are recommended for early detection of glaucoma, cataracts, and diabetic retinopathy. These exams can also reveal signs of chronic conditions such as diabetes and high blood pressure.  Dental: Annual dental screenings help detect early signs of oral cancer, gum disease, and other conditions linked to overall health, including heart disease and diabetes.  Please see the attached documents for additional preventive care recommendations.

## 2023-12-11 NOTE — Progress Notes (Signed)
 Because this visit was a virtual/telehealth visit,  certain criteria was not obtained, such a blood pressure, CBG if applicable, and timed get up and go. Any medications not marked as taking were not mentioned during the medication reconciliation part of the visit. Any vitals not documented were not able to be obtained due to this being a telehealth visit or patient was unable to self-report a recent blood pressure reading due to a lack of equipment at home via telehealth. Vitals that have been documented are verbally provided by the patient.   Subjective:   Adam Burton is a 73 y.o. who presents for a Medicare Wellness preventive visit.  As a reminder, Annual Wellness Visits don't include a physical exam, and some assessments may be limited, especially if this visit is performed virtually. We may recommend an in-person follow-up visit with your provider if needed.  Visit Complete: Virtual I connected with  Adam Burton on 12/11/23 by a audio enabled telemedicine application and verified that I am speaking with the correct person using two identifiers.  Patient Location: Home  Provider Location: Home Office  I discussed the limitations of evaluation and management by telemedicine. The patient expressed understanding and agreed to proceed.  Vital Signs: Because this visit was a virtual/telehealth visit, some criteria may be missing or patient reported. Any vitals not documented were not able to be obtained and vitals that have been documented are patient reported.  VideoDeclined- This patient declined Librarian, academic. Therefore the visit was completed with audio only.  Persons Participating in Visit: Patient.  AWV Questionnaire: No: Patient Medicare AWV questionnaire was not completed prior to this visit.  Cardiac Risk Factors include: advanced age (>61men, >39 women);dyslipidemia;hypertension;male gender     Objective:    Today's Vitals   12/11/23  0913  Weight: 155 lb (70.3 kg)  Height: 5' 10 (1.778 m)  PainSc: 2   PainLoc: Chest   Body mass index is 22.24 kg/m.     12/11/2023    9:15 AM 10/23/2023    9:34 AM 06/19/2023    8:18 AM 11/12/2022    8:50 AM 11/06/2022    8:55 AM 08/23/2022    1:53 PM 12/11/2021   11:06 AM  Advanced Directives  Does Patient Have a Medical Advance Directive? No No No No No No No  Would patient like information on creating a medical advance directive? No - Patient declined No - Patient declined No - Patient declined No - Patient declined       Current Medications (verified) Outpatient Encounter Medications as of 12/11/2023  Medication Sig   acetaminophen  (TYLENOL ) 500 MG tablet Take 2 tablets (1,000 mg total) by mouth every 8 (eight) hours as needed for moderate pain.   amLODipine  (NORVASC ) 10 MG tablet TAKE 1 TABLET BY MOUTH DAILY   aspirin  EC 81 MG tablet Take 1 tablet (81 mg total) by mouth daily.   esomeprazole  (NEXIUM ) 40 MG capsule Take 1 capsule (40 mg total) by mouth daily at 12 noon.   famotidine  (PEPCID ) 20 MG tablet Take 1 tablet (20 mg total) by mouth 2 (two) times daily. (Patient taking differently: Take 20 mg by mouth daily.)   metFORMIN  (GLUCOPHAGE -XR) 500 MG 24 hr tablet TAKE 1 TABLET BY MOUTH DAILY  WITH BREAKFAST   metoprolol  succinate (TOPROL -XL) 50 MG 24 hr tablet TAKE 1 TABLET BY MOUTH DAILY  WITH OR IMMEDIATELY FOLLOWING A  MEAL   nitroGLYCERIN  (NITROSTAT ) 0.4 MG SL tablet Place 1 tablet (0.4 mg  total) under the tongue every 5 (five) minutes as needed for chest pain.   polyethylene glycol powder (MIRALAX ) 17 GM/SCOOP powder Take 17 g by mouth daily as needed (constipation).   rosuvastatin  (CRESTOR ) 20 MG tablet TAKE 1 TABLET BY MOUTH DAILY   tamsulosin  (FLOMAX ) 0.4 MG CAPS capsule Take 2 capsules (0.8 mg total) by mouth daily. (Patient taking differently: Take 0.4 mg by mouth in the morning and at bedtime.)   No facility-administered encounter medications on file as of  12/11/2023.    Allergies (verified) Patient has no known allergies.   History: Past Medical History:  Diagnosis Date   CAD (coronary artery disease)    S/p DES to prox to mid LAD in 07/2018 // Myoview  9/22: No ischemia or infarction, EF 59, low risk   Diabetes mellitus without complication (HCC)    Hypercholesteremia    Hypertension    Renal insufficiency 08/21/2012   Past Surgical History:  Procedure Laterality Date   CARDIAC CATHETERIZATION  01/2009   Dr. [place]. Normal coronary arteries. Normal LV function.   CORONARY ANGIOGRAPHY N/A 08/04/2018   Procedure: CORONARY ANGIOGRAPHY (CATH LAB);  Surgeon: Elmira Newman PARAS, MD;  Location: [facility]VASIVE [facility];  Service: Cardiovascular;  Laterality: N/A;   CORONARY STENT INTERVENTION N/A 08/04/2018   Procedure: CORONARY STENT INTERVENTION;  Surgeon: Elmira Newman PARAS, MD;  Location: [facility]VAS[facility] CV [facility];  Service: Cardiovascular;  Laterality: N/A;   HERNIA REPAIR     NM MYOVIEW  LTD  12/2008   Potential small area of mild ischemia in the basal inferoseptal wall. Otherwise normal. --> False positive test based on coronary angiography   Stress Echocardiogram  12/2013   Exercise for just over 4 minutes. Heart rate increased to 148 BPM. Hypertensive response to exercise. EF 60%. No evidence of ischemia. NORMAL STUDY   TRANSTHORACIC ECHOCARDIOGRAM  11/2013   EF 55-60%. No regional wall motion abnormality is. Normal thickness. Mild LA dilation.   Family History  Problem Relation Age of Onset   Heart attack Mother    Diabetes Mother    Diabetes Sister    Diabetes Maternal Grandmother    Heart attack Maternal Grandmother    Diabetes Maternal Aunt    Colon cancer Neg Hx    Colon polyps Neg Hx    Esophageal cancer Neg Hx    Rectal cancer Neg Hx    Stomach cancer Neg Hx    Social History   Socioeconomic History   Marital status: Widowed    Spouse name: Not on file   Number of children: 3   Years of education: 84   Highest education  level: 12th grade  Occupational History   Occupation: Retired Investment banker, operational  Tobacco Use   Smoking status: Never   Smokeless tobacco: Never  Vaping Use   Vaping status: Never Used  Substance and Sexual Activity   Alcohol use: Yes    Alcohol/week: 4.0 standard drinks of alcohol    Types: 4 Standard drinks or equivalent per week    Comment: beer or vodka occasionally   Drug use: Yes    Types: Marijuana    Comment: 3-6 X per week   Sexual activity: Yes  Other Topics Concern   Not on file  Social History Narrative   Patient lives alone in [place].    Patient has 3 children, grandchildren, and 1 great grandchild.    Patient is a retired Investment banker, operational. Patient enjoys cooking and enjoying his food.    Patient walks for exercise when the  weather is nice, admits he could do more.    Social Drivers of Corporate investment banker Strain: Low Risk  (12/11/2023)   Overall Financial Resource Strain (CARDIA)    Difficulty of Paying Living Expenses: Not hard at all  Food Insecurity: No Food Insecurity (12/11/2023)   Hunger Vital Sign    Worried About Running Out of Food in the Last Year: Never true    Ran Out of Food in the Last Year: Never true  Transportation Needs: No Transportation Needs (12/11/2023)   PRAPARE - Administrator, Civil Service (Medical): No    Lack of Transportation (Non-Medical): No  Physical Activity: Sufficiently Active (12/11/2023)   Exercise Vital Sign    Days of Exercise per Week: 5 days    Minutes of Exercise per Session: 30 min  Stress: No Stress Concern Present (12/11/2023)   [employer] of Occupational Health - Occupational Stress Questionnaire    Feeling of Stress: Not at all  Social Connections: Socially Isolated (12/11/2023)   Social Connection and Isolation Panel    Frequency of Communication with Friends and Family: Three times a week    Frequency of Social Gatherings with Friends and Family: Once a week    Attends Religious Services: Never     Database administrator or Organizations: No    Attends Banker Meetings: Never    Marital Status: Widowed    Tobacco Counseling Counseling given: Not Answered    Clinical Intake:  Pre-visit preparation completed: Yes  Pain : No/denies pain Pain Score: 2  (PATIENT HAS STINT IN CHEST)     BMI - recorded: 22.24 Nutritional Status: BMI of 19-24  Normal Nutritional Risks: None Diabetes: Yes CBG done?: No Did pt. 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 Darroll Bredeson, LPN.   Activities of Daily Living     12/11/2023    9:18 AM  In your present state of health, do you have any difficulty performing the following activities:  Hearing? 0  Vision? 1  Difficulty concentrating or making decisions? 1  Comment BSE: NONE  Walking or climbing stairs? 0  Dressing or bathing? 0  Doing errands, shopping? 0  Preparing Food and eating ? N  Using the Toilet? N  In the past six months, have you accidently leaked urine? Y  Comment SLIGHT LEAKAGE OF URINE SOMETIMES  Do you have problems with loss of bowel control? N  Managing your Medications? N  Managing your Finances? N  Housekeeping or managing your Housekeeping? N    Patient Care Team: Donah Laymon PARAS, MD as PCP - General (Family Medicine) Verlin Lonni BIRCH, MD as PCP - Cardiology (Cardiology) Devere Lonni Righter, MD as Consulting Physician (Urology)  I have updated your Care Teams any recent Medical Services you may have received from other providers in the past year.     Assessment:   This is a routine  wellness examination for Adam Burton.  Hearing/Vision screen Hearing Screening - Comments:: Denies hearing difficulties.  Vision Screening - Comments:: Wears rx glasses -  not up to date with routine eye exams with Oneil Platts, MD.  Cataracts removed. Eyes are watering since cataracts removed. Defer to PCP    Goals Addressed             This Visit's Progress    12/11/2023       To stay independent and active.       Depression Screen     10/23/2023    9:33 AM 06/19/2023    8:19 AM 02/06/2023   10:45 AM 11/12/2022    8:57 AM 11/06/2022    8:54 AM 09/24/2022    8:24 AM 12/11/2021   11:06 AM  PHQ 2/9 Scores  PHQ - 2 Score 2 0 0 0 0 0 0  PHQ- 9 Score 4 1 0 0 0 0 0    Fall Risk     12/11/2023    9:17 AM 06/19/2023    8:19 AM 11/12/2022    8:49 AM 11/06/2022    8:54 AM 09/24/2022    8:24 AM  Fall Risk   Falls in the past year? 0 1 1 0 0  Number falls in past yr: 0 0 1 0 0  Injury with Fall? 0 1 1 0 0  Comment   bruised face no broken bones / cut/ chipped tooth    Risk for fall due to : No Fall Risks      Follow up Falls evaluation completed        MEDICARE RISK AT HOME:  Medicare Risk at Home Any stairs in or around the home?: Yes (LIVES [place] A [place]) If so, are there any without handrails?: No Home free of loose throw rugs in walkways, pet beds, electrical cords, etc?: Yes Adequate lighting in your home to reduce risk of falls?: Yes Life alert?: No Use of a cane, walker or w/c?: No Grab bars in the bathroom?: No Shower chair or bench in shower?: No Elevated toilet seat or a handicapped toilet?: No  TIMED UP AND GO:  Was the test performed?  No  Cognitive Function: Declined/Normal: No cognitive concerns noted by patient or family. Patient alert, oriented, able to answer questions appropriately and recall recent events. No signs of memory loss or confusion.    12/11/2023    9:17 AM  MMSE - Mini Mental State Exam  Not completed: Unable to complete         12/11/2023    9:24 AM 11/12/2022    8:57 AM 12/06/2020    3:13 PM  6CIT Screen  What Year? 0 points 0 points 0 points  What month? 0 points 0 points 0 points  What time? 0 points 0 points 0 points  Count back from 20 0 points 0 points 0 points  Months in reverse 0 points 4 points 0 points  Repeat phrase 0 points 0 points 0 points  Total Score 0 points 4 points 0 points    Immunizations Immunization History  Administered Date(s) Administered   Influenza Whole 01/05/2009   Janssen (J&J) SARS-COV-2 Vaccination 05/29/2019, 01/27/2020   Pneumococcal Conjugate-13 06/12/2016   Pneumococcal Polysaccharide-23 04/24/2018   Zoster Recombinant(Shingrix ) 10/08/2020    Screening Tests Health Maintenance  Topic Date Due   Zoster Vaccines- Shingrix  (2 of 2) 12/03/2020   OPHTHALMOLOGY EXAM  08/16/2023   FOOT EXAM  09/24/2023   Influenza Vaccine  09/26/2023   COVID-19 Vaccine (3 - 2025-26 season) 10/27/2023   Diabetic kidney evaluation - Urine ACR  02/06/2024   HEMOGLOBIN A1C  04/24/2024   Diabetic kidney evaluation - eGFR measurement  06/18/2024   Medicare Annual Wellness (AWV)  12/10/2024   Pneumococcal Vaccine: 50+ Years  Completed   Hepatitis C Screening  Completed   Meningococcal B Vaccine  Aged Out   DTaP/Tdap/Td  Discontinued   Colonoscopy  Discontinued    Health Maintenance Items Addressed: Yes Vaccines Due: Shingrix , Flu and Covid, Patient is due for Diabetic Eye Exam (Defer to PCP)  Additional Screening:  Vision Screening: Recommended annual ophthalmology exams for early detection of glaucoma and other disorders of the eye. Is the patient up to date with their annual eye exam?  No  Who is the provider or what is the name of the office in which the patient attends annual eye exams? Defer to PCP  Dental Screening: Recommended annual dental exams for proper oral hygiene  Community Resource Referral / Chronic Care Management: CRR required this visit?  No   CCM required  this visit?  No   Plan:    I have personally reviewed and noted the following in the patient's chart:   Medical and social history Use of alcohol, tobacco or illicit drugs  Current medications and supplements including opioid prescriptions. Patient is not currently taking opioid prescriptions. Functional ability and status Nutritional status Physical activity Advanced directives List of other physicians Hospitalizations, surgeries, and ER visits in previous 12 months Vitals Screenings to include cognitive, depression, and falls Referrals and appointments  In addition, I have reviewed and discussed with patient certain preventive protocols, quality metrics, and best practice recommendations. A written personalized care plan for preventive services as well as general preventive health recommendations were provided to patient.   Roz LOISE Fuller, LPN   [id number]   After Visit Summary: (Declined) Due to this being a telephonic visit, with patients personalized plan was offered to patient but patient Declined AVS at this time   Notes: Vaccines Due: Shingrix , Flu and Covid, Patient is due for Diabetic Eye Exam (Defer to PCP)

## 2023-12-12 ENCOUNTER — Telehealth: Payer: Self-pay | Admitting: Cardiovascular Disease

## 2023-12-12 NOTE — Telephone Encounter (Signed)
 Last 3 days when he got up his right upper chest has a lancing pain when he gets up.  Stays gone during the day but at night it returns into the morning.  It does not wake him through the night.   When it first started he took 3 tylenol  and it eased up but yesterday it didn't help.    He used to have this kind of pain on the left side before the stent.  He would have it when he was going to work.   Had stent placed in 2020 w Dr. Elmira.  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  Has not had any issues with that work.  Worked this morning.  Sometimes moving a certain way brings it on or makes it worse.  He is comfortable waiting until scheduled appointment on 12/16/23.  Has ntg but has not tried.  I asked him to try a ntg if the pain returns and report that at appointment.  Aware if pain returns and is worse or does not go away he should come in to the ER for evaluation.  Pt is in agreement and appreciative for the information.

## 2023-12-12 NOTE — Telephone Encounter (Signed)
  Pt c/o of Chest Pain: STAT if active CP, including tightness, pressure, jaw pain, radiating pain to shoulder/upper arm/back, CP unrelieved by Nitro. Symptoms reported of SOB, nausea, vomiting, sweating.  1. Are you having CP right now?   Yes - Lancing pain on chest right side  2. Are you experiencing any other symptoms (ex. SOB, nausea, vomiting, sweating)?   No  3. Is your CP continuous or coming and going? Continuous  4. Have you taken Nitroglycerin ?   Yes  5. How long have you been experiencing CP?  Patient stated episode started 3 days ago   6. If NO CP at time of call then end call with telling Pt to call back or call 911 if Chest pain returns prior to return call from triage team.   Patient is concerned he has been having a lancing pain in his chest on the right side.  Patient has appointment scheduled with T. [place], [place] on 10/21.

## 2023-12-15 NOTE — Progress Notes (Unsigned)
 Office Visit    Patient Name: Adam Burton Date of Encounter: 12/16/2023  PCP:  Donah Laymon PARAS, MD   [facility]  Cardiologist:  Lonni Cash, MD  Advanced Practice Provider:  No care team member to display Electrophysiologist:  None   HPI    Adam Burton is a 73 y.o. male past medical history significant for CAD status post proximal to mid LAD DES in 07/2017, hypertension, hyperlipidemia, diabetes mellitus type 2 presents today for 97-month follow-up.  The patient was previously followed by Dr. Elmira.  He was seen 03/03/2020 and was doing well at that time.  TTE with normal LVEF, G1 DD, mild AR.  Lexi scan in 2/21 without diaphragmatic attenuation but no ischemia.  EF was called low but TTE in 2021 with EF 61%.  He was continued on ASA, statin, metoprolol .  Seen in the clinic 04/24/2020 where he was having episodes of nonexertional chest discomfort and elevated blood pressures at home.  We recommended him to start losartan  but he had elevated K on labs prior to starting so therefore recommended amlodipine .  Extensive screening 04/2020 were carotids were negative for plaque.  ABIs were negative for PAD.  No abdominal aortic aneurysm.  Seen 10/2020 and was doing better.  Canceled Myoview  since chest pain symptoms resolved.  Last seen 04/2021 and was doing well.  Chest pain resolved with stopping spicy foods.  Stays active with no exertional symptoms.  Compliant with all medications.  I saw him 10/2021,  he tells me he has not had any more pains, he does have a twinge every now and then which is associated with food.  He has some occasional shortness of breath when he walks up the stairs but this is not new for him.  He has not had any swelling in his legs.  He plans to do a lipid panel when he sees primary next month.  His biggest issue has been liquid accumulating in the back of his mouth and he is having to scrape his tongue.  He is not sure what is  causing it and I am not sure either.  I asked him to discuss this with his primary care.  He does state that he has some fatigue when walking and his legs get tired.  Last echocardiogram in 2018 which was a stress echo.  We discussed potentially repeating an echocardiogram but he would like to hold off at this time.  Reports no chest pain, pressure, or tightness. No edema, orthopnea, PND. Reports no palpitations.    He was seen 12/17/22 by Dr. Cash.  At that time he denied any chest pain, dyspnea, palpitations, lower extremity edema, orthopnea, PND, dizziness, or syncope.  Today, he presents with a hx of intermittent right-sided chest pain and shortness of breath.His right-sided chest pain seems to be positional and not exertional which is less likely cardiac but more pleuritic in nature.  He experiences intermittent right-sided chest pain for the past week, described as a 'tightness' with a 'lancing kind of pain.' The pain is annoying, comes and goes, and sometimes worsens with certain movements, such as moving quickly to the right. It is sometimes associated with drinking beer, and belching alleviates the discomfort. There is no recent association with food intake.  He has shortness of breath for the last two to three months, particularly noticeable when walking upstairs in his condo. It is described as 'slight' and requires him to catch his breath. There is no swelling in  his feet or ankles and no irregular heartbeats.  Previous cardiac evaluations include an EKG in August with no significant changes, a negative stress test in 2022, and a normal echocardiogram last year showing no issues with heart valves or pump function.  No edema, orthopnea, PND. Reports no palpitations.   Discussed the use of AI scribe software for clinical note transcription with the patient, who gave verbal consent to proceed.   Past Medical History    Past Medical History:  Diagnosis Date   CAD (coronary artery  disease)    S/p DES to prox to mid LAD in 07/2018 // Myoview  9/22: No ischemia or infarction, EF 59, low risk   Diabetes mellitus without complication (HCC)    Hypercholesteremia    Hypertension    Renal insufficiency 08/21/2012   Past Surgical History:  Procedure Laterality Date   CARDIAC CATHETERIZATION  01/2009   Dr. [place]. Normal coronary arteries. Normal LV function.   CORONARY ANGIOGRAPHY N/A 08/04/2018   Procedure: CORONARY ANGIOGRAPHY (CATH LAB);  Surgeon: Elmira Newman PARAS, MD;  Location: [facility] INVASIVE CV LAB;  Service: Cardiovascular;  Laterality: N/A;   CORONARY STENT INTERVENTION N/A 08/04/2018   Procedure: CORONARY STENT INTERVENTION;  Surgeon: Elmira Newman PARAS, MD;  Location: [facility] INVASIVE CV LAB;  Service: Cardiovascular;  Laterality: N/A;   HERNIA REPAIR     NM MYOVIEW  LTD  12/2008   Potential small area of mild ischemia in the basal inferoseptal wall. Otherwise normal. --> False positive test based on coronary angiography   Stress Echocardiogram  12/2013   Exercise for just over 4 minutes. Heart rate increased to 148 BPM. Hypertensive response to exercise. EF 60%. No evidence of ischemia. NORMAL STUDY   TRANSTHORACIC ECHOCARDIOGRAM  11/2013   EF 55-60%. No regional wall motion abnormality is. Normal thickness. Mild LA dilation.    Allergies  No Known Allergies   EKGs/Labs/Other Studies Reviewed:   The following studies were reviewed today:   Myoview  11/22/20   The study is normal. The study is low risk.   No ST deviation was noted.   LV perfusion is normal. There is no evidence of ischemia. There is no evidence of infarction.   Left ventricular function is normal. End diastolic cavity size is normal.   Prior study available for comparison from 04/05/2019. No changes compared to prior study.   Normal study without evidence of ischemia or infarction.  Normal LVEF, 59%. Normal exercise capacity (7:00 min:s; 7.7 METS). Normal HR/BP response to exercise.  This is  low-risk study.    Echocardiogram 02/24/2020:  Left ventricle cavity is normal in size and wall thickness. Normal global  wall motion. Normal LV systolic function with EF 61%. Doppler evidence of  grade I (impaired) diastolic dysfunction, normal LAP.  Mild (Grade I) aortic regurgitation.  Normal right atrial pressure.  Compared to previous study on 02/23/2019, AI reduced from grade II to  grade I. MR is now absent.    Lexiscan  (Walking with mod Bruce)Tetrofosmin  Stress Test  04/05/2019: Nondiagnostic ECG stress. Perfusion images revealed diaphragmatic attenuation artifact in the inferior wall without evidence of ischemia or scar.  The left ventricle is mildly dilated.  There was no regional wall motion abnormality.  Mild global hypokinesis.  Calculated LVEF moderately reduced at 39%, visually EF appears to be 45%.  Low risk study.   Compared 12/08/2017, no significant change in perfusion, however there was significant EKG abnormalities noted associated with chest pain on the treadmill stress test.  Clinical correlation recommended.  Low risk study.   Coronary angiography and intervention 08/04/2018: LM: Normal LAD: Prox-mid 75% stenosis. CTA FFR 0.75.         Diag 1 small vessel with 70% disease LCx: Normal RCA: Prox 60% stenosis. CTA FFR 0.85   Successful percutaneous coronary intervention prox-mid LAD PTCA and stent placement Resolute Onyx 3.5 X 22 mm drug-eluting stent Recommend medical management for small vessel diag disease, and RCA nonobstructive disease.    EKG:  EKG is  ordered today.  The ekg ordered today demonstrates sinus bradycardia, rate 57 bpm, T wave abnormality (unchanged from previous EKG)  Recent Labs: 06/19/2023: ALT 15; BUN 10; Creatinine, Ser 1.12; Hemoglobin 13.9; Platelets 359; Potassium 4.9; Sodium 138; TSH 0.808  Recent Lipid Panel    Component Value Date/Time   CHOL 163 02/06/2023 1513   TRIG 111 02/06/2023 1513   HDL 80 02/06/2023 1513   CHOLHDL 2.0  02/06/2023 1513   CHOLHDL 5.9 (H) 11/01/2014 1026   VLDL 23 11/01/2014 1026   LDLCALC 64 02/06/2023 1513   LDLDIRECT 84 09/03/2011 0930    Home Medications   Current Meds  Medication Sig   acetaminophen  (TYLENOL ) 500 MG tablet Take 2 tablets (1,000 mg total) by mouth every 8 (eight) hours as needed for moderate pain.   amLODipine  (NORVASC ) 10 MG tablet TAKE 1 TABLET BY MOUTH DAILY   aspirin  EC 325 MG tablet Take 325 mg by mouth daily.   esomeprazole  (NEXIUM ) 40 MG capsule Take 1 capsule (40 mg total) by mouth daily at 12 noon.   famotidine  (PEPCID ) 20 MG tablet Take 1 tablet (20 mg total) by mouth 2 (two) times daily. (Patient taking differently: Take 20 mg by mouth daily.)   metFORMIN  (GLUCOPHAGE -XR) 500 MG 24 hr tablet TAKE 1 TABLET BY MOUTH DAILY  WITH BREAKFAST   metoprolol  succinate (TOPROL -XL) 50 MG 24 hr tablet TAKE 1 TABLET BY MOUTH DAILY  WITH OR IMMEDIATELY FOLLOWING A  MEAL   nitroGLYCERIN  (NITROSTAT ) 0.4 MG SL tablet Place 1 tablet (0.4 mg total) under the tongue every 5 (five) minutes as needed for chest pain.   rosuvastatin  (CRESTOR ) 20 MG tablet TAKE 1 TABLET BY MOUTH DAILY   tamsulosin  (FLOMAX ) 0.4 MG CAPS capsule Take 2 capsules (0.8 mg total) by mouth daily. (Patient taking differently: Take 0.4 mg by mouth in the morning and at bedtime.)     Review of Systems      All other systems reviewed and are otherwise negative except as noted above.  Physical Exam    VS:  BP 132/86 (BP Location: Left Arm, Patient Position: Sitting)   Pulse 60   Ht 5' 11 (1.803 m)   Wt 169 lb 3.2 oz (76.7 kg)   SpO2 98%   BMI 23.60 kg/m  , BMI Body mass index is 23.6 kg/m.  Wt Readings from Last 3 Encounters:  12/16/23 169 lb 3.2 oz (76.7 kg)  12/11/23 155 lb (70.3 kg)  10/23/23 168 lb (76.2 kg)     GEN: Well nourished, well developed, in no acute distress. HEENT: normal. Neck: Supple, no JVD, carotid bruits, or masses. Cardiac: RRR, no murmurs, rubs, or gallops. No clubbing,  cyanosis, edema.  Radials/PT 2+ and equal bilaterally.  Respiratory:  Respirations regular and unlabored, clear to auscultation bilaterally. GI: Soft, nontender, nondistended. MS: No deformity or atrophy. Skin: Warm and dry, no rash. Neuro:  Strength and sensation are intact. Psych: Normal affect.  Assessment & Plan     Known CAD status post  proximal to mid PCI of LAD/noncardiac chest pain Chest pain and exertional dyspnea Intermittent chest pain with tightness and exertional dyspnea. No significant EKG changes. Differential includes cardiac, GERD, MS - Order echocardiogram to assess heart function, valve status, and wall motion abnormalities. Schedule for late November or early December. - Coordinate with primary care for lipid panel in December.  Atherosclerotic heart disease of native coronary artery Atherosclerotic heart disease with previous normal stress test and echocardiogram. Current symptoms require assessment of cardiac function. - Review echocardiogram results to assess cardiac function.  Hypertension -well controlled today -continue current medication regimen  Hyperlipidemia -send us  lipid panel from PCP next month  Mild AR -update echo  Diabetes mellitus type 2 -Last A1c 6.4 -Per PCP   Disposition: Follow up 6 months with Lonni Cash, MD or APP.  Signed, Orren LOISE Fabry, PA-C 12/16/2023, 9:52 AM [facility]

## 2023-12-16 ENCOUNTER — Ambulatory Visit: Attending: Physician Assistant | Admitting: Physician Assistant

## 2023-12-16 ENCOUNTER — Encounter: Payer: Self-pay | Admitting: Physician Assistant

## 2023-12-16 VITALS — BP 132/86 | HR 60 | Ht 71.0 in | Wt 169.2 lb

## 2023-12-16 DIAGNOSIS — E119 Type 2 diabetes mellitus without complications: Secondary | ICD-10-CM | POA: Diagnosis not present

## 2023-12-16 DIAGNOSIS — E782 Mixed hyperlipidemia: Secondary | ICD-10-CM | POA: Diagnosis not present

## 2023-12-16 DIAGNOSIS — I2510 Atherosclerotic heart disease of native coronary artery without angina pectoris: Secondary | ICD-10-CM

## 2023-12-16 DIAGNOSIS — I340 Nonrheumatic mitral (valve) insufficiency: Secondary | ICD-10-CM

## 2023-12-16 DIAGNOSIS — I10 Essential (primary) hypertension: Secondary | ICD-10-CM | POA: Diagnosis not present

## 2023-12-16 DIAGNOSIS — I351 Nonrheumatic aortic (valve) insufficiency: Secondary | ICD-10-CM | POA: Diagnosis not present

## 2023-12-16 NOTE — Patient Instructions (Signed)
 Medication Instructions:  Your physician recommends that you continue on your current medications as directed. Please refer to the Current Medication list given to you today. *If you need a refill on your cardiac medications before your next appointment, please call your pharmacy*  Lab Work: DO A LIPID PANEL WITH YOUR PCP If you have labs (blood work) drawn today and your tests are completely normal, you will receive your results only by: MyChart Message (if you have MyChart) OR A paper copy in the mail If you have any lab test that is abnormal or we need to change your treatment, we will call you to review the results.  Testing/Procedures: Your physician has requested that you have an echocardiogram. Echocardiography is a painless test that uses sound waves to create images of your heart. It provides your doctor with information about the size and shape of your heart and how well your heart's chambers and valves are working. This procedure takes approximately one hour. There are no restrictions for this procedure. Please do NOT wear cologne, perfume, aftershave, or lotions (deodorant is allowed). Please arrive 15 minutes prior to your appointment time.  Please note: We ask at that you not bring children with you during ultrasound (echo/ vascular) testing. Due to room size and safety concerns, children are not allowed in the ultrasound rooms during exams. Our front office staff cannot provide observation of children in our lobby area while testing is being conducted. An adult accompanying a patient to their appointment will only be allowed in the ultrasound room at the discretion of the ultrasound technician under special circumstances. We apologize for any inconvenience.   Follow-Up: At [facility], you and your health needs are our priority.  As part of our continuing mission to provide you with exceptional heart care, our providers are all part of one team.  This team includes your  primary Cardiologist (physician) and Advanced Practice Providers or APPs (Physician Assistants and Nurse Practitioners) who all work together to provide you with the care you need, when you need it.  Your next appointment:   6 month(s)  Provider:   Lonni Cash, MD    We recommend signing up for the patient portal called MyChart.  Sign up information is provided on this After Visit Summary.  MyChart is used to connect with patients for Virtual Visits (Telemedicine).  Patients are able to view lab/test results, encounter notes, upcoming appointments, etc.  Non-urgent messages can be sent to your provider as well.   To learn more about what you can do with MyChart, go to [email or web address].   Other Instructions

## 2023-12-27 ENCOUNTER — Emergency Department (HOSPITAL_COMMUNITY)
Admission: EM | Admit: 2023-12-27 | Discharge: 2023-12-27 | Disposition: A | Discharge status: Home / Self Care | Attending: Emergency Medicine | Admitting: Emergency Medicine

## 2023-12-27 ENCOUNTER — Other Ambulatory Visit: Payer: Self-pay

## 2023-12-27 ENCOUNTER — Encounter (HOSPITAL_COMMUNITY): Payer: Self-pay | Admitting: *Deleted

## 2023-12-27 DIAGNOSIS — T2121XA Burn of second degree of chest wall, initial encounter: Secondary | ICD-10-CM | POA: Diagnosis not present

## 2023-12-27 DIAGNOSIS — Z7984 Long term (current) use of oral hypoglycemic drugs: Secondary | ICD-10-CM | POA: Insufficient documentation

## 2023-12-27 DIAGNOSIS — Z79899 Other long term (current) drug therapy: Secondary | ICD-10-CM | POA: Diagnosis not present

## 2023-12-27 DIAGNOSIS — E119 Type 2 diabetes mellitus without complications: Secondary | ICD-10-CM | POA: Diagnosis not present

## 2023-12-27 DIAGNOSIS — T310 Burns involving less than 10% of body surface: Secondary | ICD-10-CM | POA: Diagnosis not present

## 2023-12-27 DIAGNOSIS — T2101XA Burn of unspecified degree of chest wall, initial encounter: Secondary | ICD-10-CM | POA: Diagnosis present

## 2023-12-27 DIAGNOSIS — Z7982 Long term (current) use of aspirin: Secondary | ICD-10-CM | POA: Diagnosis not present

## 2023-12-27 DIAGNOSIS — I10 Essential (primary) hypertension: Secondary | ICD-10-CM | POA: Diagnosis not present

## 2023-12-27 DIAGNOSIS — X101XXA Contact with hot food, initial encounter: Secondary | ICD-10-CM | POA: Diagnosis not present

## 2023-12-27 DIAGNOSIS — T2610XA Burn of cornea and conjunctival sac, unspecified eye, initial encounter: Secondary | ICD-10-CM | POA: Insufficient documentation

## 2023-12-27 MED ORDER — FLUORESCEIN SODIUM 1 MG OP STRP
1.0000 | ORAL_STRIP | Freq: Once | OPHTHALMIC | Status: AC
  Administered 2023-12-27: 1 via OPHTHALMIC
  Filled 2023-12-27: qty 1

## 2023-12-27 MED ORDER — ACETAMINOPHEN 325 MG PO TABS
650.0000 mg | ORAL_TABLET | Freq: Once | ORAL | Status: AC
  Administered 2023-12-27: 650 mg via ORAL

## 2023-12-27 MED ORDER — ERYTHROMYCIN 5 MG/GM OP OINT: TOPICAL_OINTMENT | OPHTHALMIC | 0 refills | Status: DC

## 2023-12-27 MED ORDER — ACETAMINOPHEN 325 MG PO TABS
ORAL_TABLET | ORAL | Status: AC
  Filled 2023-12-27: qty 2

## 2023-12-27 MED ORDER — ERYTHROMYCIN 5 MG/GM OP OINT
1.0000 | TOPICAL_OINTMENT | Freq: Once | OPHTHALMIC | Status: AC
  Administered 2023-12-27: 1 via OPHTHALMIC
  Filled 2023-12-27: qty 3.5

## 2023-12-27 MED ORDER — TETRACAINE HCL 0.5 % OP SOLN
2.0000 [drp] | Freq: Once | OPHTHALMIC | Status: AC
  Administered 2023-12-27: 2 [drp] via OPHTHALMIC
  Filled 2023-12-27: qty 4

## 2023-12-27 MED ORDER — BACITRACIN ZINC 500 UNIT/GM EX OINT
TOPICAL_OINTMENT | Freq: Once | CUTANEOUS | Status: AC
  Administered 2023-12-27: 1 via TOPICAL
  Filled 2023-12-27: qty 0.9
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
## 2024-01-13 NOTE — Progress Notes (Signed)
 Adam Burton                                          MRN: [id number]   01/13/2024   The VBCI Quality Team Specialist reviewed this patient medical record for the purposes of chart review for care gap closure. The following were reviewed: chart review for care gap closure-kidney health evaluation for diabetes:eGFR  and uACR.    VBCI Quality Team

## 2024-01-13 NOTE — Progress Notes (Signed)
 Adam Burton                                          MRN: [id number]   01/13/2024   The VBCI Quality Team Specialist reviewed this patient medical record for the purposes of chart review for care gap closure. The following were reviewed: abstraction for care gap closure-glycemic status assessment.    VBCI Quality Team

## 2024-01-14 ENCOUNTER — Telehealth: Payer: Self-pay | Admitting: Pharmacist

## 2024-01-14 NOTE — Telephone Encounter (Signed)
 Patient contacted for follow-up of DM QI'25. Reach out to schedule patient with PCP for lipid panel and UACR.  Appointment scheduled with Dr. Donah on 02/03/2024 at 10:30 AM  Total time with patient call and documentation of interaction: 6 minutes.

## 2024-01-27 ENCOUNTER — Ambulatory Visit (HOSPITAL_COMMUNITY)
Admission: RE | Admit: 2024-01-27 | Discharge: 2024-01-27 | Disposition: A | Discharge status: Home / Self Care | Source: Ambulatory Visit | Attending: Physician Assistant | Admitting: Physician Assistant

## 2024-01-27 DIAGNOSIS — E119 Type 2 diabetes mellitus without complications: Secondary | ICD-10-CM | POA: Diagnosis present

## 2024-01-27 DIAGNOSIS — I340 Nonrheumatic mitral (valve) insufficiency: Secondary | ICD-10-CM | POA: Diagnosis present

## 2024-01-27 DIAGNOSIS — I10 Essential (primary) hypertension: Secondary | ICD-10-CM | POA: Insufficient documentation

## 2024-01-27 DIAGNOSIS — I2510 Atherosclerotic heart disease of native coronary artery without angina pectoris: Secondary | ICD-10-CM | POA: Diagnosis present

## 2024-01-27 DIAGNOSIS — E782 Mixed hyperlipidemia: Secondary | ICD-10-CM | POA: Insufficient documentation

## 2024-01-27 DIAGNOSIS — I351 Nonrheumatic aortic (valve) insufficiency: Secondary | ICD-10-CM | POA: Diagnosis present

## 2024-01-27 LAB — ECHOCARDIOGRAM COMPLETE
AR max vel: 2.1 cm2
AV Area VTI: 2.04 cm2
AV Area mean vel: 1.91 cm2
AV Mean grad: 3 mmHg
AV Peak grad: 6.7 mmHg
Ao pk vel: 1.29 m/s
Area-P 1/2: 3.58 cm2
S' Lateral: 3.19 cm

## 2024-01-29 ENCOUNTER — Ambulatory Visit: Payer: Self-pay | Admitting: Physician Assistant

## 2024-02-03 ENCOUNTER — Ambulatory Visit: Admitting: Family Medicine

## 2024-02-03 ENCOUNTER — Encounter: Payer: Self-pay | Admitting: Family Medicine

## 2024-02-03 VITALS — BP 121/72 | HR 68 | Ht 71.0 in | Wt 173.0 lb

## 2024-02-03 DIAGNOSIS — E119 Type 2 diabetes mellitus without complications: Secondary | ICD-10-CM

## 2024-02-03 LAB — POCT GLYCOSYLATED HEMOGLOBIN (HGB A1C): HbA1c, POC (controlled diabetic range): 6.1 % (ref 0.0–7.0)

## 2024-02-03 NOTE — Patient Instructions (Signed)
 It was great to see you again today.  Stay on current medications Follow up with me in 6 months   Be well, Dr. Donah

## 2024-02-03 NOTE — Progress Notes (Unsigned)
  Date of Visit: 02/03/2024   SUBJECTIVE:   HPI:  Discussed the use of AI scribe software for clinical note transcription with the patient, who gave verbal consent to proceed.  History of Present Illness       OBJECTIVE:   BP 121/72   Pulse 68   Ht 5' 11 (1.803 m)   Wt 173 lb (78.5 kg)   SpO2 99%   BMI 24.13 kg/m  Gen: *** HEENT: *** Heart: *** Lungs: *** Neuro: *** Ext: ***  ASSESSMENT/PLAN:   Assessment & Plan Type 2 diabetes mellitus without complication, without long-term current use of insulin (HCC)      Assessment and Plan Assessment & Plan       FOLLOW UP: Follow up in *** for ***  Adam Burton J. Donah, MD [facility]

## 2024-02-04 ENCOUNTER — Ambulatory Visit: Payer: Self-pay | Admitting: Family Medicine

## 2024-02-04 LAB — MICROALBUMIN / CREATININE URINE RATIO
Creatinine, Urine: 104.2 mg/dL
Microalb/Creat Ratio: 25 mg/g{creat} (ref 0–29)
Microalbumin, Urine: 25.7 ug/mL

## 2024-02-04 NOTE — Progress Notes (Signed)
 Adam Burton                                          MRN: [id number]   02/04/2024   The VBCI Quality Team Specialist reviewed this patient medical record for the purposes of chart review for care gap closure. The following were reviewed: chart review for care gap closure-kidney health evaluation for diabetes:eGFR  and uACR.    VBCI Quality Team

## 2024-02-05 NOTE — Assessment & Plan Note (Signed)
 Plan for lipids at next visit, continue statin

## 2024-02-05 NOTE — Assessment & Plan Note (Signed)
 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 Current treatment includes tamsulosin  and metformin . Patient dissatisfied with current urologist's management. - Consider referral to a different urologist for second opinion. - Continue tamsulosin  0.4 mg twice daily.

## 2024-02-05 NOTE — Assessment & Plan Note (Signed)
 Well controlled. Continue current medication regimen.

## 2024-02-05 NOTE — Assessment & Plan Note (Signed)
 UACR today Encouraged flu shot; he declines Discussed shingrix 

## 2024-02-05 NOTE — Assessment & Plan Note (Signed)
 Well-controlled with A1c of 6.1. Concerns about metformin  affecting libido, but diabetes itself may contribute. - Continue metformin  500 mg daily.

## 2024-02-16 ENCOUNTER — Other Ambulatory Visit: Payer: Self-pay | Admitting: Family Medicine

## 2024-02-16 NOTE — Progress Notes (Signed)
 Adam Burton                                          MRN: [id number]   02/16/2024   The VBCI Quality Team Specialist reviewed this patient medical record for the purposes of chart review for care gap closure. The following were reviewed: abstraction for care gap closure-kidney health evaluation for diabetes:eGFR  and uACR.    VBCI Quality Team
